# Patient Record
Sex: Female | Born: 1955
Health system: Southern US, Community
[De-identification: ages and names within clinical notes are randomized; demographics above are authoritative.]

## PROBLEM LIST (undated history)

## (undated) ENCOUNTER — Emergency Department (HOSPITAL_COMMUNITY): Admission: EM | Payer: Self-pay | Source: Home / Self Care

## (undated) DIAGNOSIS — M1990 Unspecified osteoarthritis, unspecified site: Secondary | ICD-10-CM

## (undated) DIAGNOSIS — T7840XA Allergy, unspecified, initial encounter: Secondary | ICD-10-CM

## (undated) DIAGNOSIS — F419 Anxiety disorder, unspecified: Secondary | ICD-10-CM

## (undated) DIAGNOSIS — N393 Stress incontinence (female) (male): Secondary | ICD-10-CM

## (undated) DIAGNOSIS — J449 Chronic obstructive pulmonary disease, unspecified: Secondary | ICD-10-CM

## (undated) DIAGNOSIS — R000 Tachycardia, unspecified: Secondary | ICD-10-CM

## (undated) DIAGNOSIS — J45909 Unspecified asthma, uncomplicated: Secondary | ICD-10-CM

## (undated) DIAGNOSIS — M797 Fibromyalgia: Secondary | ICD-10-CM

## (undated) DIAGNOSIS — G629 Polyneuropathy, unspecified: Secondary | ICD-10-CM

## (undated) DIAGNOSIS — F32A Depression, unspecified: Secondary | ICD-10-CM

## (undated) DIAGNOSIS — D649 Anemia, unspecified: Secondary | ICD-10-CM

## (undated) DIAGNOSIS — F329 Major depressive disorder, single episode, unspecified: Secondary | ICD-10-CM

## (undated) DIAGNOSIS — K259 Gastric ulcer, unspecified as acute or chronic, without hemorrhage or perforation: Secondary | ICD-10-CM

## (undated) DIAGNOSIS — K219 Gastro-esophageal reflux disease without esophagitis: Secondary | ICD-10-CM

## (undated) DIAGNOSIS — I10 Essential (primary) hypertension: Secondary | ICD-10-CM

## (undated) HISTORY — PX: TUBAL LIGATION: SHX77

## (undated) HISTORY — PX: TONSILLECTOMY: SUR1361

## (undated) HISTORY — DX: Essential (primary) hypertension: I10

## (undated) HISTORY — DX: Allergy, unspecified, initial encounter: T78.40XA

## (undated) HISTORY — DX: Unspecified asthma, uncomplicated: J45.909

## (undated) HISTORY — PX: SHOULDER SURGERY: SHX246

## (undated) HISTORY — PX: INCONTINENCE SURGERY: SHX676

## (undated) HISTORY — PX: SPLENECTOMY, PARTIAL: SHX787

---
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
## 2002-10-29 ENCOUNTER — Other Ambulatory Visit: Admission: RE | Admit: 2002-10-29 | Discharge: 2002-10-29 | Payer: Self-pay | Admitting: Family Medicine

## 2002-12-02 ENCOUNTER — Other Ambulatory Visit: Admission: RE | Admit: 2002-12-02 | Discharge: 2002-12-02 | Payer: Self-pay | Admitting: Family Medicine

## 2003-12-09 ENCOUNTER — Other Ambulatory Visit: Admission: RE | Admit: 2003-12-09 | Discharge: 2003-12-09 | Payer: Self-pay | Admitting: *Deleted

## 2004-02-24 ENCOUNTER — Other Ambulatory Visit: Admission: RE | Admit: 2004-02-24 | Discharge: 2004-02-24 | Payer: Self-pay | Admitting: *Deleted

## 2004-02-24 ENCOUNTER — Other Ambulatory Visit: Admission: RE | Admit: 2004-02-24 | Discharge: 2004-02-24 | Payer: Self-pay | Admitting: Family Medicine

## 2009-03-03 ENCOUNTER — Ambulatory Visit: Payer: Self-pay | Admitting: Cardiology

## 2009-07-10 ENCOUNTER — Ambulatory Visit (HOSPITAL_COMMUNITY): Admission: RE | Admit: 2009-07-10 | Discharge: 2009-07-10 | Payer: Self-pay | Admitting: Neurology

## 2010-05-08 ENCOUNTER — Emergency Department (HOSPITAL_COMMUNITY)
Admission: EM | Admit: 2010-05-08 | Discharge: 2010-05-09 | Disposition: A | Discharge status: Home / Self Care | Payer: MEDICARE | Attending: Emergency Medicine | Admitting: Emergency Medicine

## 2010-05-08 DIAGNOSIS — F329 Major depressive disorder, single episode, unspecified: Secondary | ICD-10-CM | POA: Insufficient documentation

## 2010-05-08 DIAGNOSIS — G609 Hereditary and idiopathic neuropathy, unspecified: Secondary | ICD-10-CM | POA: Insufficient documentation

## 2010-05-08 DIAGNOSIS — G8929 Other chronic pain: Secondary | ICD-10-CM | POA: Insufficient documentation

## 2010-05-08 DIAGNOSIS — F3289 Other specified depressive episodes: Secondary | ICD-10-CM | POA: Insufficient documentation

## 2010-05-08 DIAGNOSIS — I10 Essential (primary) hypertension: Secondary | ICD-10-CM | POA: Insufficient documentation

## 2010-05-08 LAB — DIFFERENTIAL
Basophils Absolute: 0.1 10*3/uL (ref 0.0–0.1)
Basophils Relative: 1 % (ref 0–1)
Lymphocytes Relative: 30 % (ref 12–46)
Lymphs Abs: 2.8 10*3/uL (ref 0.7–4.0)
Neutro Abs: 5.3 10*3/uL (ref 1.7–7.7)
Neutrophils Relative %: 57 % (ref 43–77)

## 2010-05-08 LAB — CBC
HCT: 41.8 % (ref 36.0–46.0)
Hemoglobin: 13.9 g/dL (ref 12.0–15.0)
MCH: 31.2 pg (ref 26.0–34.0)
MCHC: 33.3 g/dL (ref 30.0–36.0)
MCV: 93.9 fL (ref 78.0–100.0)
Platelets: 249 K/uL (ref 150–400)
RBC: 4.45 MIL/uL (ref 3.87–5.11)
RDW: 15.2 % (ref 11.5–15.5)
WBC: 9.4 K/uL (ref 4.0–10.5)

## 2010-05-08 LAB — RAPID URINE DRUG SCREEN, HOSP PERFORMED: Barbiturates: NOT DETECTED

## 2010-05-09 LAB — COMPREHENSIVE METABOLIC PANEL
ALT: 24 U/L (ref 0–35)
Alkaline Phosphatase: 85 U/L (ref 39–117)
GFR calc non Af Amer: >60 mL/min (ref >60)
Glucose, Bld: 84 mg/dL (ref 70–99)
Sodium: 134 mEq/L — ABNORMAL LOW (ref 135–145)
Total Bilirubin: 0.4 mg/dL (ref 0.3–1.2)
Total Protein: 7.3 g/dL (ref 6.0–8.3)

## 2011-01-07 ENCOUNTER — Encounter (HOSPITAL_COMMUNITY): Payer: Self-pay | Admitting: Pharmacy Technician

## 2011-01-10 NOTE — Patient Instructions (Addendum)
[address]  01/10/2011   Your procedure is scheduled on:  01/18/2011  Report to [facility] at 07:30 AM.  Call this number if you have problems the morning of surgery: [phone]   Remember:   Do not eat food:After Midnight.  May have clear liquids:until Midnight .  Clear liquids include soda, tea, black coffee, apple or grape juice, broth.  Take these medicines the morning of surgery with A SIP OF WATER: Xanax, Citalopram, Cymbalta, Gabapentin, Metoprolol, Percocet, Tramadol, Desmopressin, and Metocarbamol.   Do not wear jewelry, make-up or nail polish.  Do not wear lotions, powders, or perfumes. You may wear deodorant.  Do not shave 48 hours prior to surgery.  Do not bring valuables to the hospital.  Contacts, dentures or bridgework may not be worn into surgery.  Leave suitcase in the car. After surgery it may be brought to your room.  For patients admitted to the hospital, checkout time is 11:00 AM the day of discharge.   Patients discharged the day of surgery will not be allowed to drive home.  Name and phone number of your driver:   Special Instructions: CHG Shower Use Special Wash: 1/2 bottle night before surgery and 1/2 bottle morning of surgery.   Please read over the following fact sheets that you were given: Pain Booklet, MRSA Information, Surgical Site Infection Prevention, Anesthesia Post-op Instructions and Care and Recovery After Surgery     Tension Free Vaginal Tape System (TVT) Tension free vaginal tape (TVT or TFT) procedure is a surgery procedure to correct involuntary leaking from the bladder(urinary stress incontinence) in women. A 1/3-inch prolene mesh tape is used as a sling underneath the area where the bladder and tube that drains the bladder(urethra) connect. This restores the muscle and connective tissue (fascia) that have been stretched and damaged from having babies, or from chronic stress and straining. The operation takes less than an hour. It is an  outpatient procedure (you go home the same day). It has an 80 to 90% success rate. LET YOUR CAREGIVER KNOW ABOUT:   If you have any allergies, especially to medicines.   All the medicines you are taking, including prescription drugs, over-the-counter drugs, herbal medicines, eye drops, and creams.   Previous problems with anesthesia or Novocaine.   History of blood clots or other bleeding problems.   Previous surgery, especially bladder or vaginal surgery.   Other health problems or medical diseases.  RISKS AND COMPLICATIONS   Bleeding.   Infection.   Injury to surrounding organs and blood vessels.   Problems with urination after the surgery.   Failure of the operation to work as expected.   Allergy to the mesh tape.   Problems or allergies to the anesthetic.  BEFORE THE PROCEDURE   Do not eat or drink anything for 12 hours before the surgery.   Do not take aspirin or blood thinners for one week before the surgery.   Let your caregiver know if you develop a cold or an infection before the operation.   If you are admitted on the day of surgery, arrive at the hospital an hour before the operation, to sign any necessary forms and to get prepared for the surgery.   There is a waiting area available for family and friends during the surgery.   Let your caregiver know if you think you may be pregnant.  PROCEDURE  An intravenous (IV) will be placed in your arm, and a medicine will be given to relax  you, but not put you to sleep. You will be given a local anesthetic in the area above the pubic bone and along the top of the vagina. Two very small incisions will be made just above the pubic bone. The lining (mucosa) at the top of the vagina will be opened and separated on each side of the urethra and bladder. A catheter is then placed in the bladder, for filling and drainage during the procedure. Then the prolene mesh tape is placed under the urethra, and the ends are passed up  behind the pubic bone, just under the skin. After the mesh tape is properly placed, the catheter is removed. The patient is asked to cough, so that adjustments can be made to the tape, to make sure there is no leaking of urine before stitching (suturing) the mesh tape in place. The ends of the mesh tape are brought up, just under the skin, and sutured in place. The skin incisions are sutured closed. The vaginal lining is also stitched closed, and you are taken to the recovery room. This operation should not be done if the woman is pregnant or plans to get pregnant, is on blood thinners (anticoagulant treatment), or has a urinary tract infection. AFTER THE PROCEDURE  You will go to a postoperative recovery area until your vital signs (blood pressure, pulse, breathing, and temperature) are stable. You may need to stay in the hospital a day or two, but most patients are sent home the day of the surgery. You may be sent home with pain pills or an antibiotic, if necessary. HOME CARE INSTRUCTIONS   Follow your caregiver's advice about diet, rest, driving, exercise, medicines, and follow-up appointments.   You may take over-the-counter pain medicine, with your caregiver's recommendation.   Do not take aspirin. It can cause bleeding.   Do not lift over 5 pounds, until your caregiver says it is ok.   Do not have sexual intercourse, until your caregiver says it is ok.   Do not use tampons.   You may take a laxative if needed, with your caregiver's recommendation.   You may take sitz baths 2 to 3 times a day with your caregiver's advice.  SEEK MEDICAL CARE IF:   There is increasing pain in the wound area.   There is increased swelling and redness in the wound area.   You develop a rash.   You develop nausea, diarrhea, constipation or vomiting.   You think the stitches in the wound are breaking up.   You lose urine when you cough or sneeze.  SEEK IMMEDIATE MEDICAL CARE IF:   You have an oral  temperature above 102 F (38.9 C), not controlled by medicine.   You begin to bleed from the wound area, above the pubic bone or vagina.   You see pus coming from the incisions.   You develop an abnormal vaginal discharge.   You cannot urinate.   You develop bloody or painful urination.   You pass out.   You develop chest or leg pain.   You develop abdominal pain.  Document Released: 02/15/2009 Document Revised: 10/06/2010 Document Reviewed: 02/15/2009 [facility] Patient Information 2012 [place], [place].    PATIENT INSTRUCTIONS POST-ANESTHESIA  IMMEDIATELY FOLLOWING SURGERY:  Do not drive or operate machinery for the first twenty four hours after surgery.  Do not make any important decisions for twenty four hours after surgery or while taking narcotic pain medications or sedatives.  If you develop intractable nausea and vomiting or a  severe headache please notify your doctor immediately.  FOLLOW-UP:  Please make an appointment with your surgeon as instructed. You do not need to follow up with anesthesia unless specifically instructed to do so.  WOUND CARE INSTRUCTIONS (if applicable):  Keep a dry clean dressing on the anesthesia/puncture wound site if there is drainage.  Once the wound has quit draining you may leave it open to air.  Generally you should leave the bandage intact for twenty four hours unless there is drainage.  If the epidural site drains for more than 36-48 hours please call the anesthesia department.  QUESTIONS?:  Please feel free to call your physician or the hospital operator if you have any questions, and they will be happy to assist you.     [facility] Anesthesia Department [address] [place] [phone]

## 2011-01-11 ENCOUNTER — Other Ambulatory Visit: Payer: Self-pay

## 2011-01-11 ENCOUNTER — Encounter (HOSPITAL_COMMUNITY)
Admission: RE | Admit: 2011-01-11 | Discharge: 2011-01-11 | Disposition: A | Discharge status: Home / Self Care | Payer: Medicare Other | Source: Ambulatory Visit | Attending: Urology | Admitting: Urology

## 2011-01-11 ENCOUNTER — Encounter (HOSPITAL_COMMUNITY): Payer: Self-pay

## 2011-01-11 DIAGNOSIS — Z01818 Encounter for other preprocedural examination: Secondary | ICD-10-CM | POA: Insufficient documentation

## 2011-01-11 DIAGNOSIS — Z01812 Encounter for preprocedural laboratory examination: Secondary | ICD-10-CM | POA: Insufficient documentation

## 2011-01-11 HISTORY — DX: Anxiety disorder, unspecified: F41.9

## 2011-01-11 HISTORY — DX: Major depressive disorder, single episode, unspecified: F32.9

## 2011-01-11 HISTORY — DX: Depression, unspecified: F32.A

## 2011-01-11 HISTORY — DX: Fibromyalgia: M79.7

## 2011-01-11 HISTORY — DX: Tachycardia, unspecified: R00.0

## 2011-01-11 HISTORY — DX: Chronic obstructive pulmonary disease, unspecified: J44.9

## 2011-01-11 LAB — BASIC METABOLIC PANEL
Calcium: 9.7 mg/dL (ref 8.4–10.5)
Creatinine, Ser: 0.81 mg/dL (ref 0.50–1.10)

## 2011-01-11 LAB — CBC
MCH: 31.9 pg (ref 26.0–34.0)
MCHC: 34.4 g/dL (ref 30.0–36.0)
RBC: 4.32 MIL/uL (ref 3.87–5.11)
WBC: 10.6 10*3/uL — ABNORMAL HIGH (ref 4.0–10.5)

## 2011-01-11 NOTE — Pre-Procedure Instructions (Signed)
Labs called to and faxed to Dr Rudean Haskell office.

## 2011-01-12 NOTE — Pre-Procedure Instructions (Signed)
Pt is on Desmopressin and her pre-op lab work from 01/11/11 showed her sodium level to be 127. Dr. Jayme Cloud made aware, no orders given.

## 2011-01-18 ENCOUNTER — Ambulatory Visit (HOSPITAL_COMMUNITY): Admission: RE | Admit: 2011-01-18 | Payer: Medicare Other | Source: Ambulatory Visit | Admitting: Urology

## 2011-01-18 ENCOUNTER — Encounter (HOSPITAL_COMMUNITY): Admission: RE | Payer: Self-pay | Source: Ambulatory Visit

## 2011-01-18 SURGERY — URETHROPEXY, USING TRANSVAGINAL TAPE
Anesthesia: Choice

## 2012-02-08 HISTORY — PX: COLONOSCOPY: SHX174

## 2015-02-26 DIAGNOSIS — G894 Chronic pain syndrome: Secondary | ICD-10-CM | POA: Diagnosis not present

## 2015-02-26 DIAGNOSIS — Q059 Spina bifida, unspecified: Secondary | ICD-10-CM | POA: Diagnosis not present

## 2015-02-26 DIAGNOSIS — M545 Low back pain: Secondary | ICD-10-CM | POA: Diagnosis not present

## 2015-02-26 DIAGNOSIS — Z79899 Other long term (current) drug therapy: Secondary | ICD-10-CM | POA: Diagnosis not present

## 2015-02-26 DIAGNOSIS — M159 Polyosteoarthritis, unspecified: Secondary | ICD-10-CM | POA: Diagnosis not present

## 2015-02-26 DIAGNOSIS — M797 Fibromyalgia: Secondary | ICD-10-CM | POA: Diagnosis not present

## 2015-02-26 DIAGNOSIS — M25519 Pain in unspecified shoulder: Secondary | ICD-10-CM | POA: Diagnosis not present

## 2015-05-19 DIAGNOSIS — M061 Adult-onset Still's disease: Secondary | ICD-10-CM | POA: Diagnosis not present

## 2015-05-19 DIAGNOSIS — M1711 Unilateral primary osteoarthritis, right knee: Secondary | ICD-10-CM | POA: Diagnosis not present

## 2015-05-26 DIAGNOSIS — J449 Chronic obstructive pulmonary disease, unspecified: Secondary | ICD-10-CM | POA: Diagnosis not present

## 2015-05-26 DIAGNOSIS — I10 Essential (primary) hypertension: Secondary | ICD-10-CM | POA: Diagnosis not present

## 2015-05-26 DIAGNOSIS — Z79899 Other long term (current) drug therapy: Secondary | ICD-10-CM | POA: Diagnosis not present

## 2015-05-26 DIAGNOSIS — M545 Low back pain: Secondary | ICD-10-CM | POA: Diagnosis not present

## 2015-05-26 DIAGNOSIS — F329 Major depressive disorder, single episode, unspecified: Secondary | ICD-10-CM | POA: Diagnosis not present

## 2015-05-26 DIAGNOSIS — R2681 Unsteadiness on feet: Secondary | ICD-10-CM | POA: Diagnosis not present

## 2015-05-26 DIAGNOSIS — Q059 Spina bifida, unspecified: Secondary | ICD-10-CM | POA: Diagnosis not present

## 2015-05-26 DIAGNOSIS — F17200 Nicotine dependence, unspecified, uncomplicated: Secondary | ICD-10-CM | POA: Diagnosis not present

## 2015-05-26 DIAGNOSIS — G894 Chronic pain syndrome: Secondary | ICD-10-CM | POA: Diagnosis not present

## 2015-05-26 DIAGNOSIS — M25519 Pain in unspecified shoulder: Secondary | ICD-10-CM | POA: Diagnosis not present

## 2015-05-26 DIAGNOSIS — N393 Stress incontinence (female) (male): Secondary | ICD-10-CM | POA: Diagnosis not present

## 2015-05-26 DIAGNOSIS — M797 Fibromyalgia: Secondary | ICD-10-CM | POA: Diagnosis not present

## 2015-06-02 DIAGNOSIS — Z1231 Encounter for screening mammogram for malignant neoplasm of breast: Secondary | ICD-10-CM | POA: Diagnosis not present

## 2015-06-22 DIAGNOSIS — Z79899 Other long term (current) drug therapy: Secondary | ICD-10-CM | POA: Diagnosis not present

## 2015-06-22 DIAGNOSIS — Z5181 Encounter for therapeutic drug level monitoring: Secondary | ICD-10-CM | POA: Diagnosis not present

## 2015-06-26 DIAGNOSIS — M545 Low back pain: Secondary | ICD-10-CM | POA: Diagnosis not present

## 2015-06-26 DIAGNOSIS — M19011 Primary osteoarthritis, right shoulder: Secondary | ICD-10-CM | POA: Diagnosis not present

## 2015-07-03 DIAGNOSIS — R600 Localized edema: Secondary | ICD-10-CM | POA: Diagnosis not present

## 2015-07-12 DIAGNOSIS — R05 Cough: Secondary | ICD-10-CM | POA: Diagnosis not present

## 2015-07-12 DIAGNOSIS — J40 Bronchitis, not specified as acute or chronic: Secondary | ICD-10-CM | POA: Diagnosis not present

## 2015-07-22 DIAGNOSIS — J40 Bronchitis, not specified as acute or chronic: Secondary | ICD-10-CM | POA: Diagnosis not present

## 2015-07-22 DIAGNOSIS — I10 Essential (primary) hypertension: Secondary | ICD-10-CM | POA: Diagnosis not present

## 2015-07-23 DIAGNOSIS — Z79899 Other long term (current) drug therapy: Secondary | ICD-10-CM | POA: Diagnosis not present

## 2015-07-23 DIAGNOSIS — M797 Fibromyalgia: Secondary | ICD-10-CM | POA: Diagnosis not present

## 2015-07-23 DIAGNOSIS — F329 Major depressive disorder, single episode, unspecified: Secondary | ICD-10-CM | POA: Diagnosis not present

## 2015-07-23 DIAGNOSIS — F17200 Nicotine dependence, unspecified, uncomplicated: Secondary | ICD-10-CM | POA: Diagnosis not present

## 2015-07-23 DIAGNOSIS — M545 Low back pain: Secondary | ICD-10-CM | POA: Diagnosis not present

## 2015-07-23 DIAGNOSIS — G894 Chronic pain syndrome: Secondary | ICD-10-CM | POA: Diagnosis not present

## 2015-07-23 DIAGNOSIS — J449 Chronic obstructive pulmonary disease, unspecified: Secondary | ICD-10-CM | POA: Diagnosis not present

## 2015-07-23 DIAGNOSIS — N393 Stress incontinence (female) (male): Secondary | ICD-10-CM | POA: Diagnosis not present

## 2015-07-23 DIAGNOSIS — M25519 Pain in unspecified shoulder: Secondary | ICD-10-CM | POA: Diagnosis not present

## 2015-07-23 DIAGNOSIS — I10 Essential (primary) hypertension: Secondary | ICD-10-CM | POA: Diagnosis not present

## 2015-07-23 DIAGNOSIS — R2681 Unsteadiness on feet: Secondary | ICD-10-CM | POA: Diagnosis not present

## 2015-07-23 DIAGNOSIS — Q059 Spina bifida, unspecified: Secondary | ICD-10-CM | POA: Diagnosis not present

## 2015-07-27 DIAGNOSIS — R600 Localized edema: Secondary | ICD-10-CM | POA: Diagnosis not present

## 2015-09-21 DIAGNOSIS — Z79891 Long term (current) use of opiate analgesic: Secondary | ICD-10-CM | POA: Diagnosis not present

## 2015-09-21 DIAGNOSIS — F329 Major depressive disorder, single episode, unspecified: Secondary | ICD-10-CM | POA: Diagnosis not present

## 2015-09-21 DIAGNOSIS — F17200 Nicotine dependence, unspecified, uncomplicated: Secondary | ICD-10-CM | POA: Diagnosis not present

## 2015-09-21 DIAGNOSIS — G894 Chronic pain syndrome: Secondary | ICD-10-CM | POA: Diagnosis not present

## 2015-09-21 DIAGNOSIS — Q059 Spina bifida, unspecified: Secondary | ICD-10-CM | POA: Diagnosis not present

## 2015-09-21 DIAGNOSIS — M797 Fibromyalgia: Secondary | ICD-10-CM | POA: Diagnosis not present

## 2015-09-21 DIAGNOSIS — M25519 Pain in unspecified shoulder: Secondary | ICD-10-CM | POA: Diagnosis not present

## 2015-09-21 DIAGNOSIS — I10 Essential (primary) hypertension: Secondary | ICD-10-CM | POA: Diagnosis not present

## 2015-09-21 DIAGNOSIS — R2681 Unsteadiness on feet: Secondary | ICD-10-CM | POA: Diagnosis not present

## 2015-09-21 DIAGNOSIS — J449 Chronic obstructive pulmonary disease, unspecified: Secondary | ICD-10-CM | POA: Diagnosis not present

## 2015-09-21 DIAGNOSIS — N393 Stress incontinence (female) (male): Secondary | ICD-10-CM | POA: Diagnosis not present

## 2015-09-21 DIAGNOSIS — M545 Low back pain: Secondary | ICD-10-CM | POA: Diagnosis not present

## 2015-10-02 DIAGNOSIS — F17200 Nicotine dependence, unspecified, uncomplicated: Secondary | ICD-10-CM | POA: Diagnosis not present

## 2015-10-02 DIAGNOSIS — F329 Major depressive disorder, single episode, unspecified: Secondary | ICD-10-CM | POA: Diagnosis not present

## 2015-10-02 DIAGNOSIS — M25519 Pain in unspecified shoulder: Secondary | ICD-10-CM | POA: Diagnosis not present

## 2015-10-02 DIAGNOSIS — M545 Low back pain: Secondary | ICD-10-CM | POA: Diagnosis not present

## 2015-10-02 DIAGNOSIS — J449 Chronic obstructive pulmonary disease, unspecified: Secondary | ICD-10-CM | POA: Diagnosis not present

## 2015-10-02 DIAGNOSIS — N393 Stress incontinence (female) (male): Secondary | ICD-10-CM | POA: Diagnosis not present

## 2015-10-02 DIAGNOSIS — M797 Fibromyalgia: Secondary | ICD-10-CM | POA: Diagnosis not present

## 2015-10-02 DIAGNOSIS — G894 Chronic pain syndrome: Secondary | ICD-10-CM | POA: Diagnosis not present

## 2015-10-02 DIAGNOSIS — I10 Essential (primary) hypertension: Secondary | ICD-10-CM | POA: Diagnosis not present

## 2015-10-02 DIAGNOSIS — Z79899 Other long term (current) drug therapy: Secondary | ICD-10-CM | POA: Diagnosis not present

## 2015-10-02 DIAGNOSIS — R2681 Unsteadiness on feet: Secondary | ICD-10-CM | POA: Diagnosis not present

## 2015-10-02 DIAGNOSIS — Q059 Spina bifida, unspecified: Secondary | ICD-10-CM | POA: Diagnosis not present

## 2015-11-19 DIAGNOSIS — R2681 Unsteadiness on feet: Secondary | ICD-10-CM | POA: Diagnosis not present

## 2015-11-19 DIAGNOSIS — N393 Stress incontinence (female) (male): Secondary | ICD-10-CM | POA: Diagnosis not present

## 2015-11-19 DIAGNOSIS — I10 Essential (primary) hypertension: Secondary | ICD-10-CM | POA: Diagnosis not present

## 2015-11-19 DIAGNOSIS — M797 Fibromyalgia: Secondary | ICD-10-CM | POA: Diagnosis not present

## 2015-11-19 DIAGNOSIS — F17200 Nicotine dependence, unspecified, uncomplicated: Secondary | ICD-10-CM | POA: Diagnosis not present

## 2015-11-19 DIAGNOSIS — Z79899 Other long term (current) drug therapy: Secondary | ICD-10-CM | POA: Diagnosis not present

## 2015-11-19 DIAGNOSIS — M545 Low back pain: Secondary | ICD-10-CM | POA: Diagnosis not present

## 2015-11-19 DIAGNOSIS — J449 Chronic obstructive pulmonary disease, unspecified: Secondary | ICD-10-CM | POA: Diagnosis not present

## 2015-11-19 DIAGNOSIS — Z79891 Long term (current) use of opiate analgesic: Secondary | ICD-10-CM | POA: Diagnosis not present

## 2015-11-19 DIAGNOSIS — F329 Major depressive disorder, single episode, unspecified: Secondary | ICD-10-CM | POA: Diagnosis not present

## 2015-11-19 DIAGNOSIS — Q059 Spina bifida, unspecified: Secondary | ICD-10-CM | POA: Diagnosis not present

## 2015-11-19 DIAGNOSIS — G894 Chronic pain syndrome: Secondary | ICD-10-CM | POA: Diagnosis not present

## 2015-11-19 DIAGNOSIS — M25519 Pain in unspecified shoulder: Secondary | ICD-10-CM | POA: Diagnosis not present

## 2015-11-25 ENCOUNTER — Other Ambulatory Visit (HOSPITAL_COMMUNITY): Payer: Self-pay | Admitting: Neurology

## 2015-11-25 ENCOUNTER — Ambulatory Visit (HOSPITAL_COMMUNITY)
Admission: RE | Admit: 2015-11-25 | Discharge: 2015-11-25 | Disposition: A | Discharge status: Home / Self Care | Payer: PPO | Source: Ambulatory Visit | Attending: Neurology | Admitting: Neurology

## 2015-11-25 DIAGNOSIS — X58XXXA Exposure to other specified factors, initial encounter: Secondary | ICD-10-CM | POA: Diagnosis not present

## 2015-11-25 DIAGNOSIS — S42033A Displaced fracture of lateral end of unspecified clavicle, initial encounter for closed fracture: Secondary | ICD-10-CM | POA: Diagnosis not present

## 2015-11-25 DIAGNOSIS — S2241XA Multiple fractures of ribs, right side, initial encounter for closed fracture: Secondary | ICD-10-CM | POA: Diagnosis not present

## 2015-11-25 DIAGNOSIS — M25511 Pain in right shoulder: Secondary | ICD-10-CM | POA: Insufficient documentation

## 2015-11-25 DIAGNOSIS — G8929 Other chronic pain: Secondary | ICD-10-CM | POA: Insufficient documentation

## 2015-11-25 DIAGNOSIS — R52 Pain, unspecified: Secondary | ICD-10-CM

## 2015-12-02 ENCOUNTER — Ambulatory Visit (INDEPENDENT_AMBULATORY_CARE_PROVIDER_SITE_OTHER): Payer: PPO | Admitting: Orthopaedic Surgery

## 2015-12-02 ENCOUNTER — Encounter: Payer: Self-pay | Admitting: Orthopaedic Surgery

## 2015-12-02 VITALS — BP 141/76 | HR 76 | Temp 97.3°F | Ht 60.0 in | Wt 145.0 lb

## 2015-12-02 DIAGNOSIS — F17210 Nicotine dependence, cigarettes, uncomplicated: Secondary | ICD-10-CM | POA: Diagnosis not present

## 2015-12-02 DIAGNOSIS — M25511 Pain in right shoulder: Secondary | ICD-10-CM

## 2015-12-02 NOTE — Patient Instructions (Signed)
Bupropion sustained-release tablets (smoking cessation) What is this medicine? BUPROPION (byoo PROE pee on) is used to help people quit smoking. This medicine may be used for other purposes; ask your health care provider or pharmacist if you have questions. What should I tell my health care provider before I take this medicine? They need to know if you have any of these conditions: -an eating disorder, such as anorexia or bulimia -bipolar disorder or psychosis -diabetes or high blood sugar, treated with medication -glaucoma -head injury or brain tumor -heart disease, previous heart attack, or irregular heart beat -high blood pressure -kidney or liver disease -seizures -suicidal thoughts or a previous suicide attempt -Tourette's syndrome -weight loss -an unusual or allergic reaction to bupropion, other medicines, foods, dyes, or preservatives -breast-feeding -pregnant or trying to become pregnant How should I use this medicine? Take this medicine by mouth with a glass of water. Follow the directions on the prescription label. You can take it with or without food. If it upsets your stomach, take it with food. Do not cut, crush or chew this medicine. Take your medicine at regular intervals. If you take this medicine more than once a day, take your second dose at least 8 hours after you take your first dose. To limit difficulty in sleeping, avoid taking this medicine at bedtime. Do not take your medicine more often than directed. Do not stop taking this medicine suddenly except upon the advice of your doctor. Stopping this medicine too quickly may cause serious side effects. A special MedGuide will be given to you by the pharmacist with each prescription and refill. Be sure to read this information carefully each time. Talk to your pediatrician regarding the use of this medicine in children. Special care may be needed. Overdosage: If you think you have taken too much of this medicine contact a  poison control center or emergency room at once. NOTE: This medicine is only for you. Do not share this medicine with others. What if I miss a dose? If you miss a dose, skip the missed dose and take your next tablet at the regular time. There should be at least 8 hours between doses. Do not take double or extra doses. What may interact with this medicine? Do not take this medicine with any of the following medications: -linezolid -MAOIs like Azilect, Carbex, Eldepryl, Marplan, Nardil, and Parnate -methylene blue (injected into a vein) -other medicines that contain bupropion like Wellbutrin This medicine may also interact with the following medications: -alcohol -certain medicines for anxiety or sleep -certain medicines for blood pressure like metoprolol, propranolol -certain medicines for depression or psychotic disturbances -certain medicines for HIV or AIDS like efavirenz, lopinavir, nelfinavir, ritonavir -certain medicines for irregular heart beat like propafenone, flecainide -certain medicines for Parkinson's disease like amantadine, levodopa -certain medicines for seizures like carbamazepine, phenytoin, phenobarbital -cimetidine -clopidogrel -cyclophosphamide -furazolidone -isoniazid -nicotine -orphenadrine -procarbazine -steroid medicines like prednisone or cortisone -stimulant medicines for attention disorders, weight loss, or to stay awake -tamoxifen -theophylline -thiotepa -ticlopidine -tramadol -warfarin This list may not describe all possible interactions. Give your health care provider a list of all the medicines, herbs, non-prescription drugs, or dietary supplements you use. Also tell them if you smoke, drink alcohol, or use illegal drugs. Some items may interact with your medicine. What should I watch for while using this medicine? Visit your doctor or health care professional for regular checks on your progress. This medicine should be used together with a patient  support program. It is important   to participate in a behavioral program, counseling, or other support program that is recommended by your health care professional. Patients and their families should watch out for new or worsening thoughts of suicide or depression. Also watch out for sudden changes in feelings such as feeling anxious, agitated, panicky, irritable, hostile, aggressive, impulsive, severely restless, overly excited and hyperactive, or not being able to sleep. If this happens, especially at the beginning of treatment or after a change in dose, call your health care professional. Avoid alcoholic drinks while taking this medicine. Drinking excessive alcoholic beverages, using sleeping or anxiety medicines, or quickly stopping the use of these agents while taking this medicine may increase your risk for a seizure. Do not drive or use heavy machinery until you know how this medicine affects you. This medicine can impair your ability to perform these tasks. Do not take this medicine close to bedtime. It may prevent you from sleeping. Your mouth may get dry. Chewing sugarless gum or sucking hard candy, and drinking plenty of water may help. Contact your doctor if the problem does not go away or is severe. Do not use nicotine patches or chewing gum without the advice of your doctor or health care professional while taking this medicine. You may need to have your blood pressure taken regularly if your doctor recommends that you use both nicotine and this medicine together. What side effects may I notice from receiving this medicine? Side effects that you should report to your doctor or health care professional as soon as possible: -allergic reactions like skin rash, itching or hives, swelling of the face, lips, or tongue -breathing problems -changes in vision -confusion -fast or irregular heartbeat -hallucinations -increased blood pressure -redness, blistering, peeling or loosening of the skin,  including inside the mouth -seizures -suicidal thoughts or other mood changes -unusually weak or tired -vomiting Side effects that usually do not require medical attention (report to your doctor or health care professional if they continue or are bothersome): -change in sex drive or performance -constipation -headache -loss of appetite -nausea -tremors -weight loss This list may not describe all possible side effects. Call your doctor for medical advice about side effects. You may report side effects to FDA at 1-800-FDA-1088. Where should I keep my medicine? Keep out of the reach of children. Store at room temperature between 20 and 25 degrees C (68 and 77 degrees F). Protect from light. Keep container tightly closed. Throw away any unused medicine after the expiration date. NOTE: This sheet is a summary. It may not cover all possible information. If you have questions about this medicine, talk to your doctor, pharmacist, or health care provider.    2016, [employer]vier/[employer] Standard. (2012-09-21 10:55:10)  
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
## 2015-12-08 ENCOUNTER — Ambulatory Visit: Payer: PPO | Admitting: Physical Therapy

## 2015-12-10 DIAGNOSIS — R2681 Unsteadiness on feet: Secondary | ICD-10-CM | POA: Diagnosis not present

## 2015-12-10 DIAGNOSIS — I10 Essential (primary) hypertension: Secondary | ICD-10-CM | POA: Diagnosis not present

## 2015-12-10 DIAGNOSIS — M545 Low back pain: Secondary | ICD-10-CM | POA: Diagnosis not present

## 2015-12-10 DIAGNOSIS — M5412 Radiculopathy, cervical region: Secondary | ICD-10-CM | POA: Diagnosis not present

## 2015-12-10 DIAGNOSIS — G894 Chronic pain syndrome: Secondary | ICD-10-CM | POA: Diagnosis not present

## 2015-12-10 DIAGNOSIS — Q059 Spina bifida, unspecified: Secondary | ICD-10-CM | POA: Diagnosis not present

## 2015-12-10 DIAGNOSIS — Z79891 Long term (current) use of opiate analgesic: Secondary | ICD-10-CM | POA: Diagnosis not present

## 2015-12-10 DIAGNOSIS — N393 Stress incontinence (female) (male): Secondary | ICD-10-CM | POA: Diagnosis not present

## 2015-12-10 DIAGNOSIS — G603 Idiopathic progressive neuropathy: Secondary | ICD-10-CM | POA: Diagnosis not present

## 2015-12-10 DIAGNOSIS — M797 Fibromyalgia: Secondary | ICD-10-CM | POA: Diagnosis not present

## 2015-12-10 DIAGNOSIS — M25519 Pain in unspecified shoulder: Secondary | ICD-10-CM | POA: Diagnosis not present

## 2015-12-14 ENCOUNTER — Ambulatory Visit: Payer: PPO | Admitting: Physical Therapy

## 2015-12-16 ENCOUNTER — Ambulatory Visit: Payer: PPO | Admitting: Orthopaedic Surgery

## 2016-01-05 ENCOUNTER — Ambulatory Visit: Payer: PPO | Admitting: Orthopaedic Surgery

## 2016-01-18 DIAGNOSIS — M25519 Pain in unspecified shoulder: Secondary | ICD-10-CM | POA: Diagnosis not present

## 2016-01-18 DIAGNOSIS — Q059 Spina bifida, unspecified: Secondary | ICD-10-CM | POA: Diagnosis not present

## 2016-01-18 DIAGNOSIS — M545 Low back pain: Secondary | ICD-10-CM | POA: Diagnosis not present

## 2016-01-18 DIAGNOSIS — R2681 Unsteadiness on feet: Secondary | ICD-10-CM | POA: Diagnosis not present

## 2016-01-18 DIAGNOSIS — I10 Essential (primary) hypertension: Secondary | ICD-10-CM | POA: Diagnosis not present

## 2016-01-18 DIAGNOSIS — N393 Stress incontinence (female) (male): Secondary | ICD-10-CM | POA: Diagnosis not present

## 2016-01-18 DIAGNOSIS — Z79899 Other long term (current) drug therapy: Secondary | ICD-10-CM | POA: Diagnosis not present

## 2016-01-18 DIAGNOSIS — G894 Chronic pain syndrome: Secondary | ICD-10-CM | POA: Diagnosis not present

## 2016-01-18 DIAGNOSIS — M797 Fibromyalgia: Secondary | ICD-10-CM | POA: Diagnosis not present

## 2016-01-18 DIAGNOSIS — F17200 Nicotine dependence, unspecified, uncomplicated: Secondary | ICD-10-CM | POA: Diagnosis not present

## 2016-01-18 DIAGNOSIS — F329 Major depressive disorder, single episode, unspecified: Secondary | ICD-10-CM | POA: Diagnosis not present

## 2016-01-18 DIAGNOSIS — J449 Chronic obstructive pulmonary disease, unspecified: Secondary | ICD-10-CM | POA: Diagnosis not present

## 2016-02-17 DIAGNOSIS — M545 Low back pain: Secondary | ICD-10-CM | POA: Diagnosis not present

## 2016-02-17 DIAGNOSIS — M25519 Pain in unspecified shoulder: Secondary | ICD-10-CM | POA: Diagnosis not present

## 2016-02-17 DIAGNOSIS — I10 Essential (primary) hypertension: Secondary | ICD-10-CM | POA: Diagnosis not present

## 2016-02-17 DIAGNOSIS — F329 Major depressive disorder, single episode, unspecified: Secondary | ICD-10-CM | POA: Diagnosis not present

## 2016-02-17 DIAGNOSIS — N393 Stress incontinence (female) (male): Secondary | ICD-10-CM | POA: Diagnosis not present

## 2016-02-17 DIAGNOSIS — F17200 Nicotine dependence, unspecified, uncomplicated: Secondary | ICD-10-CM | POA: Diagnosis not present

## 2016-02-17 DIAGNOSIS — Q059 Spina bifida, unspecified: Secondary | ICD-10-CM | POA: Diagnosis not present

## 2016-02-17 DIAGNOSIS — R2681 Unsteadiness on feet: Secondary | ICD-10-CM | POA: Diagnosis not present

## 2016-02-17 DIAGNOSIS — Z79899 Other long term (current) drug therapy: Secondary | ICD-10-CM | POA: Diagnosis not present

## 2016-02-17 DIAGNOSIS — G894 Chronic pain syndrome: Secondary | ICD-10-CM | POA: Diagnosis not present

## 2016-02-17 DIAGNOSIS — J449 Chronic obstructive pulmonary disease, unspecified: Secondary | ICD-10-CM | POA: Diagnosis not present

## 2016-02-17 DIAGNOSIS — M797 Fibromyalgia: Secondary | ICD-10-CM | POA: Diagnosis not present

## 2016-02-29 ENCOUNTER — Inpatient Hospital Stay (HOSPITAL_COMMUNITY)
Admission: EM | Admit: 2016-02-29 | Discharge: 2016-03-02 | DRG: 378 | Disposition: A | Discharge status: Home / Self Care | Payer: PPO | Attending: Internal Medicine | Admitting: Internal Medicine

## 2016-02-29 ENCOUNTER — Emergency Department (HOSPITAL_COMMUNITY): Payer: PPO

## 2016-02-29 ENCOUNTER — Encounter (HOSPITAL_COMMUNITY): Payer: Self-pay | Admitting: Emergency Medicine

## 2016-02-29 DIAGNOSIS — K922 Gastrointestinal hemorrhage, unspecified: Secondary | ICD-10-CM

## 2016-02-29 DIAGNOSIS — D62 Acute posthemorrhagic anemia: Secondary | ICD-10-CM | POA: Diagnosis present

## 2016-02-29 DIAGNOSIS — A09 Infectious gastroenteritis and colitis, unspecified: Secondary | ICD-10-CM | POA: Diagnosis not present

## 2016-02-29 DIAGNOSIS — K2901 Acute gastritis with bleeding: Secondary | ICD-10-CM | POA: Diagnosis not present

## 2016-02-29 DIAGNOSIS — I10 Essential (primary) hypertension: Secondary | ICD-10-CM | POA: Diagnosis present

## 2016-02-29 DIAGNOSIS — T50905A Adverse effect of unspecified drugs, medicaments and biological substances, initial encounter: Secondary | ICD-10-CM | POA: Diagnosis present

## 2016-02-29 DIAGNOSIS — F329 Major depressive disorder, single episode, unspecified: Secondary | ICD-10-CM | POA: Diagnosis present

## 2016-02-29 DIAGNOSIS — N393 Stress incontinence (female) (male): Secondary | ICD-10-CM | POA: Diagnosis present

## 2016-02-29 DIAGNOSIS — D649 Anemia, unspecified: Secondary | ICD-10-CM | POA: Diagnosis not present

## 2016-02-29 DIAGNOSIS — R000 Tachycardia, unspecified: Secondary | ICD-10-CM | POA: Diagnosis present

## 2016-02-29 DIAGNOSIS — R112 Nausea with vomiting, unspecified: Secondary | ICD-10-CM

## 2016-02-29 DIAGNOSIS — Z7982 Long term (current) use of aspirin: Secondary | ICD-10-CM | POA: Diagnosis not present

## 2016-02-29 DIAGNOSIS — Z9851 Tubal ligation status: Secondary | ICD-10-CM

## 2016-02-29 DIAGNOSIS — K208 Other esophagitis: Secondary | ICD-10-CM | POA: Diagnosis present

## 2016-02-29 DIAGNOSIS — F419 Anxiety disorder, unspecified: Secondary | ICD-10-CM | POA: Diagnosis present

## 2016-02-29 DIAGNOSIS — K250 Acute gastric ulcer with hemorrhage: Principal | ICD-10-CM | POA: Diagnosis present

## 2016-02-29 DIAGNOSIS — Z8711 Personal history of peptic ulcer disease: Secondary | ICD-10-CM | POA: Diagnosis not present

## 2016-02-29 DIAGNOSIS — Z79899 Other long term (current) drug therapy: Secondary | ICD-10-CM | POA: Diagnosis not present

## 2016-02-29 DIAGNOSIS — J441 Chronic obstructive pulmonary disease with (acute) exacerbation: Secondary | ICD-10-CM | POA: Diagnosis not present

## 2016-02-29 DIAGNOSIS — Z9081 Acquired absence of spleen: Secondary | ICD-10-CM

## 2016-02-29 DIAGNOSIS — M797 Fibromyalgia: Secondary | ICD-10-CM | POA: Diagnosis present

## 2016-02-29 DIAGNOSIS — K259 Gastric ulcer, unspecified as acute or chronic, without hemorrhage or perforation: Secondary | ICD-10-CM | POA: Diagnosis not present

## 2016-02-29 DIAGNOSIS — R0902 Hypoxemia: Secondary | ICD-10-CM | POA: Diagnosis not present

## 2016-02-29 DIAGNOSIS — F17210 Nicotine dependence, cigarettes, uncomplicated: Secondary | ICD-10-CM | POA: Diagnosis present

## 2016-02-29 DIAGNOSIS — D72829 Elevated white blood cell count, unspecified: Secondary | ICD-10-CM | POA: Diagnosis present

## 2016-02-29 DIAGNOSIS — R197 Diarrhea, unspecified: Secondary | ICD-10-CM | POA: Diagnosis not present

## 2016-02-29 DIAGNOSIS — R05 Cough: Secondary | ICD-10-CM | POA: Diagnosis not present

## 2016-02-29 DIAGNOSIS — K921 Melena: Secondary | ICD-10-CM | POA: Diagnosis not present

## 2016-02-29 HISTORY — DX: Stress incontinence (female) (male): N39.3

## 2016-02-29 LAB — MRSA PCR SCREENING: MRSA BY PCR: NEGATIVE

## 2016-02-29 LAB — CBC WITH DIFFERENTIAL/PLATELET
Basophils Absolute: 0 10*3/uL (ref 0.0–0.1)
Basophils Relative: 0 %
EOS ABS: 0 10*3/uL (ref 0.0–0.7)
EOS PCT: 0 %
HCT: 26.6 % — ABNORMAL LOW (ref 36.0–46.0)
HEMOGLOBIN: 9.1 g/dL — AB (ref 12.0–15.0)
LYMPHS ABS: 1.4 10*3/uL (ref 0.7–4.0)
LYMPHS PCT: 8 %
MCH: 31 pg (ref 26.0–34.0)
MCHC: 34.2 g/dL (ref 30.0–36.0)
MCV: 90.5 fL (ref 78.0–100.0)
Monocytes Absolute: 0.9 10*3/uL (ref 0.1–1.0)
Monocytes Relative: 5 %
NEUTROS PCT: 87 %
Neutro Abs: 15.3 10*3/uL — ABNORMAL HIGH (ref 1.7–7.7)
Platelets: 292 10*3/uL (ref 150–400)
RBC: 2.94 MIL/uL — AB (ref 3.87–5.11)
RDW: 12.6 % (ref 11.5–15.5)
WBC: 17.7 10*3/uL — AB (ref 4.0–10.5)

## 2016-02-29 LAB — COMPREHENSIVE METABOLIC PANEL
ALT: 12 U/L — ABNORMAL LOW (ref 14–54)
AST: 18 U/L (ref 15–41)
Albumin: 3 g/dL — ABNORMAL LOW (ref 3.5–5.0)
Alkaline Phosphatase: 59 U/L (ref 38–126)
Anion gap: 9 (ref 5–15)
BILIRUBIN TOTAL: 0.4 mg/dL (ref 0.3–1.2)
BUN: 46 mg/dL — ABNORMAL HIGH (ref 6–20)
CALCIUM: 8.7 mg/dL — AB (ref 8.9–10.3)
CHLORIDE: 96 mmol/L — AB (ref 101–111)
CO2: 27 mmol/L (ref 22–32)
CREATININE: 0.9 mg/dL (ref 0.44–1.00)
GLUCOSE: 142 mg/dL — AB (ref 65–99)
POTASSIUM: 3.4 mmol/L — AB (ref 3.5–5.1)
Sodium: 132 mmol/L — ABNORMAL LOW (ref 135–145)
Total Protein: 6.1 g/dL — ABNORMAL LOW (ref 6.5–8.1)

## 2016-02-29 LAB — URINALYSIS, ROUTINE W REFLEX MICROSCOPIC
Bilirubin Urine: NEGATIVE
Glucose, UA: NEGATIVE mg/dL
Ketones, ur: NEGATIVE mg/dL
Leukocytes, UA: NEGATIVE
Nitrite: NEGATIVE
PH: 7 (ref 5.0–8.0)
Protein, ur: NEGATIVE mg/dL
SPECIFIC GRAVITY, URINE: 1.012 (ref 1.005–1.030)

## 2016-02-29 LAB — C DIFFICILE QUICK SCREEN W PCR REFLEX
C DIFFICILE (CDIFF) INTERP: NOT DETECTED
C DIFFICILE (CDIFF) TOXIN: NEGATIVE
C Diff antigen: NEGATIVE

## 2016-02-29 LAB — POC OCCULT BLOOD, ED: FECAL OCCULT BLD: POSITIVE — AB

## 2016-02-29 LAB — HEMOGLOBIN
Hemoglobin: 8.4 g/dL — ABNORMAL LOW (ref 12.0–15.0)
Hemoglobin: 8.2 g/dL — ABNORMAL LOW (ref 12.0–15.0)

## 2016-02-29 LAB — INFLUENZA PANEL BY PCR (TYPE A & B)
INFLBPCR: NEGATIVE
Influenza A By PCR: NEGATIVE

## 2016-02-29 LAB — HEMATOCRIT
HCT: 23.7 % — ABNORMAL LOW (ref 36.0–46.0)
HEMATOCRIT: 24.2 % — AB (ref 36.0–46.0)

## 2016-02-29 LAB — TSH: TSH: 0.865 u[IU]/mL (ref 0.350–4.500)

## 2016-02-29 LAB — LIPASE, BLOOD: Lipase: 21 U/L (ref 11–51)

## 2016-02-29 MED ORDER — SODIUM CHLORIDE 0.9 % IV SOLN
80.0000 mg | Freq: Once | INTRAVENOUS | Status: AC
  Administered 2016-02-29: 14:00:00 80 mg via INTRAVENOUS
  Filled 2016-02-29: qty 80

## 2016-02-29 MED ORDER — ACETAMINOPHEN 325 MG PO TABS
650.0000 mg | ORAL_TABLET | Freq: Four times a day (QID) | ORAL | Status: DC | PRN
  Administered 2016-02-29: 650 mg via ORAL
  Filled 2016-02-29 (×2): qty 2

## 2016-02-29 MED ORDER — ONDANSETRON HCL 4 MG PO TABS: 4.0000 mg | ORAL_TABLET | Freq: Four times a day (QID) | ORAL | Status: DC | PRN

## 2016-02-29 MED ORDER — GABAPENTIN 300 MG PO CAPS
600.0000 mg | ORAL_CAPSULE | Freq: Four times a day (QID) | ORAL | Status: DC
  Administered 2016-02-29 – 2016-03-02 (×6): 600 mg via ORAL
  Filled 2016-02-29 (×7): qty 2

## 2016-02-29 MED ORDER — ALBUTEROL SULFATE (2.5 MG/3ML) 0.083% IN NEBU: 2.5000 mg | INHALATION_SOLUTION | Freq: Four times a day (QID) | RESPIRATORY_TRACT | Status: DC

## 2016-02-29 MED ORDER — ACETAMINOPHEN 650 MG RE SUPP: 650.0000 mg | Freq: Four times a day (QID) | RECTAL | Status: DC | PRN

## 2016-02-29 MED ORDER — DULOXETINE HCL 30 MG PO CPEP
30.0000 mg | ORAL_CAPSULE | ORAL | Status: DC
  Administered 2016-03-01 – 2016-03-02 (×2): 30 mg via ORAL
  Filled 2016-02-29 (×2): qty 1

## 2016-02-29 MED ORDER — ALPRAZOLAM 0.5 MG PO TABS
0.5000 mg | ORAL_TABLET | Freq: Three times a day (TID) | ORAL | Status: DC | PRN
  Administered 2016-02-29 – 2016-03-02 (×5): 0.5 mg via ORAL
  Filled 2016-02-29 (×6): qty 1

## 2016-02-29 MED ORDER — PANTOPRAZOLE SODIUM 40 MG IV SOLR: 40.0000 mg | Freq: Two times a day (BID) | INTRAVENOUS | Status: DC

## 2016-02-29 MED ORDER — ORAL CARE MOUTH RINSE
15.0000 mL | Freq: Two times a day (BID) | OROMUCOSAL | Status: DC
  Administered 2016-02-29 – 2016-03-02 (×3): 15 mL via OROMUCOSAL

## 2016-02-29 MED ORDER — ONDANSETRON HCL 4 MG/2ML IJ SOLN: 4.0000 mg | Freq: Four times a day (QID) | INTRAMUSCULAR | Status: DC | PRN

## 2016-02-29 MED ORDER — CITALOPRAM HYDROBROMIDE 20 MG PO TABS
40.0000 mg | ORAL_TABLET | Freq: Every day | ORAL | Status: DC
  Administered 2016-02-29 – 2016-03-02 (×3): 40 mg via ORAL
  Filled 2016-02-29 (×3): qty 2

## 2016-02-29 MED ORDER — SODIUM CHLORIDE 0.9 % IV SOLN
8.0000 mg/h | INTRAVENOUS | Status: DC
  Administered 2016-02-29: 8 mg/h via INTRAVENOUS
  Filled 2016-02-29 (×5): qty 80

## 2016-02-29 MED ORDER — ALBUTEROL SULFATE (2.5 MG/3ML) 0.083% IN NEBU
2.5000 mg | INHALATION_SOLUTION | Freq: Three times a day (TID) | RESPIRATORY_TRACT | Status: DC
  Administered 2016-02-29 – 2016-03-02 (×5): 2.5 mg via RESPIRATORY_TRACT
  Filled 2016-02-29 (×6): qty 3

## 2016-02-29 MED ORDER — KCL IN DEXTROSE-NACL 20-5-0.9 MEQ/L-%-% IV SOLN
INTRAVENOUS | Status: DC
  Administered 2016-02-29 – 2016-03-02 (×3): via INTRAVENOUS

## 2016-02-29 MED ORDER — ALBUTEROL SULFATE (2.5 MG/3ML) 0.083% IN NEBU
2.5000 mg | INHALATION_SOLUTION | RESPIRATORY_TRACT | Status: DC | PRN
  Administered 2016-03-01: 2.5 mg via RESPIRATORY_TRACT

## 2016-02-29 MED ORDER — ONDANSETRON HCL 4 MG/2ML IJ SOLN
4.0000 mg | Freq: Once | INTRAMUSCULAR | Status: AC
  Administered 2016-02-29: 4 mg via INTRAVENOUS
  Filled 2016-02-29: qty 2

## 2016-02-29 MED ORDER — SODIUM CHLORIDE 0.9 % IV BOLUS (SEPSIS)
1000.0000 mL | Freq: Once | INTRAVENOUS | Status: AC
  Administered 2016-02-29: 1000 mL via INTRAVENOUS

## 2016-02-29 MED ORDER — SODIUM CHLORIDE 0.9% FLUSH
3.0000 mL | Freq: Two times a day (BID) | INTRAVENOUS | Status: DC
  Administered 2016-02-29 – 2016-03-02 (×5): 3 mL via INTRAVENOUS

## 2016-02-29 NOTE — ED Triage Notes (Signed)
Per [facility]: Pt reports n/v/d, fever since Saturday night.  Pt alert and oriented at this time.

## 2016-02-29 NOTE — Progress Notes (Signed)
Patient has had 3 loose black stools since admission to ICU. Hgb on admission 9.1 and is now 8.4, MD notified.

## 2016-02-29 NOTE — ED Provider Notes (Signed)
[facility] DEPT Provider Note   CSN: [id number] Arrival date & time: 02/29/16  1023     History   Chief Complaint Chief Complaint  Patient presents with  . Emesis    HPI Brooke Deleon is a 61 y.o. female.  Brooke Deleon is a 61 y.o. Female who presents to the emergency department complaining of cough, nausea, vomiting, diarrhea and subjective fevers for the past 3 days. She tells me her stool is black in color and diarrhea. Patient also reports some intermittent wheezing. She tells me multiple episodes of vomiting and diarrhea. She denies any abdominal pain. She complains of hunger pains. No treatments attempted prior to arrival. She does tell me she spoke to her primary care doctor and was prescribed doxycycline and cough syrup for her cough. Patient has had tubal ligation and partial splenectomy. Patient denies hematemesis, urinary symptoms, rashes, shortness of breath, chest pain, syncope.    The history is provided by the patient. No language interpreter was used.  Emesis   Associated symptoms include cough, diarrhea and a fever. Pertinent negatives include no abdominal pain and no headaches.    Past Medical History:  Diagnosis Date  . Anxiety   . COPD (chronic obstructive pulmonary disease) ([place])   . Depression   . Fibromyalgia   . Stress incontinence   . Tachycardia     Patient Active Problem List   Diagnosis Date Noted  . Diarrhea 02/29/2016  . Upper GI bleeding 02/29/2016  . COPD with acute exacerbation ([place]) 02/29/2016  . Pill esophagitis 02/29/2016  . Upper GI bleed 02/29/2016    Past Surgical History:  Procedure Laterality Date  . [facility]  . [facility], [facility]     [facility]spleen [facility] due to M[facility]  . TONSILLECTOMY     child  . TUBAL LIGATION      OB History    No data available       Home Medications    Prior to Admission medications   Medication Sig Start Date  End Date Taking? Authorizing Provider  ALPRAZolam Duanne Moron) 1 MG tablet Take 1 mg by mouth 4 (four) times daily.     Yes Historical Provider, MD  aspirin 81 MG chewable tablet Chew 81 mg by mouth every morning.     Yes Historical Provider, MD  citalopram (CELEXA) 40 MG tablet Take 40 mg by mouth daily.     Yes Historical Provider, MD  desmopressin (DDAVP) 0.2 MG tablet Take 0.2 mg by mouth at bedtime. Patient had a prescription written on 01/04/11 from Dr. Michela Pitcher  For this med to be taken for 2 weeks, but patient was already taking what she had at home.    Yes Historical Provider, MD  DULoxetine (CYMBALTA) 30 MG capsule Take 30 mg by mouth every morning.     Yes Historical Provider, MD  gabapentin (NEURONTIN) 600 MG tablet Take 600 mg by mouth 4 (four) times daily.     Yes Historical Provider, MD  methocarbamol (ROBAXIN) 750 MG tablet Take 750 mg by mouth 3 (three) times daily.     Yes Historical Provider, MD  metoprolol (LOPRESSOR) 100 MG tablet Take 100 mg by mouth every morning.     Yes Historical Provider, MD  oxyCODONE-acetaminophen (PERCOCET) 10-325 MG per tablet Take 1 tablet by mouth 3 (three) times daily.     Yes Historical Provider, MD    Family History Family History  Problem Relation Age  of Onset  . Anesthesia problems Neg Hx   . Hypotension Neg Hx   . Malignant hyperthermia Neg Hx   . Pseudochol deficiency Neg Hx   . Colon cancer Neg Hx     Social History Social History  Substance Use Topics  . Smoking status: Current Every Day Smoker    Packs/day: 1.00    Years: 40.00    Types: Cigarettes  . Smokeless tobacco: Not on file  . Alcohol use No     Allergies   Codeine   Review of Systems Review of Systems  Constitutional: Positive for fatigue and fever.  HENT: Negative for congestion and sore throat.   Eyes: Negative for visual disturbance.  Respiratory: Positive for cough and wheezing. Negative for shortness of breath.   Cardiovascular: Negative for chest pain and  palpitations.  Gastrointestinal: Positive for diarrhea, nausea and vomiting. Negative for abdominal pain and blood in stool.  Genitourinary: Negative for difficulty urinating, dysuria, frequency and urgency.  Musculoskeletal: Negative for back pain and neck pain.  Skin: Negative for rash.  Neurological: Negative for syncope and headaches.     Physical Exam Updated Vital Signs BP 155/75   Pulse 111   Temp 98.7 F (37.1 C) (Oral)   Resp 17   Ht 5' (1.524 m)   Wt 65.8 kg   SpO2 99%   BMI 28.32 kg/m   Physical Exam  Constitutional: She appears well-developed and well-nourished. No distress.  HENT:  Head: Normocephalic and atraumatic.  Mouth/Throat: Oropharynx is clear and moist.  Eyes: Conjunctivae are normal. Pupils are equal, round, and reactive to light. Right eye exhibits no discharge. Left eye exhibits no discharge.  Neck: Neck supple.  Cardiovascular: Normal rate, regular rhythm, normal heart sounds and intact distal pulses.  Exam reveals no gallop and no friction rub.   No murmur heard. Pulmonary/Chest: Effort normal. No respiratory distress. She has wheezes. She has no rales.  Slight scattered wheezes noted bilaterally. No rales or rhonchi. No increased work of breathing.  Abdominal: Soft. Bowel sounds are normal. She exhibits no distension and no mass. There is tenderness. There is no guarding.  Abdomen is soft. Bowel sounds are present. Patient has mild generalized bone tenderness to palpation without focal tenderness. No CVA or flank tenderness. No peritoneal signs.  Musculoskeletal: She exhibits no edema or tenderness.  Lymphadenopathy:    She has no cervical adenopathy.  Neurological: She is alert. Coordination normal.  Skin: Skin is warm and dry. Capillary refill takes less than 2 seconds. No rash noted. She is not diaphoretic. No erythema. No pallor.  Psychiatric: She has a normal mood and affect. Her behavior is normal.  Nursing note and vitals reviewed.    ED  Treatments / Results  Labs (all labs ordered are listed, but only abnormal results are displayed) Labs Reviewed  COMPREHENSIVE METABOLIC PANEL - Abnormal; Notable for the following:       Result Value   Sodium 132 (*)    Potassium 3.4 (*)    Chloride 96 (*)    Glucose, Bld 142 (*)    BUN 46 (*)    Calcium 8.7 (*)    Total Protein 6.1 (*)    Albumin 3.0 (*)    ALT 12 (*)    All other components within normal limits  CBC WITH DIFFERENTIAL/PLATELET - Abnormal; Notable for the following:    WBC 17.7 (*)    RBC 2.94 (*)    Hemoglobin 9.1 (*)    HCT 26.6 (*)  Neutro Abs 15.3 (*)    All other components within normal limits  URINALYSIS, ROUTINE W REFLEX MICROSCOPIC - Abnormal; Notable for the following:    Color, Urine STRAW (*)    Hgb urine dipstick MODERATE (*)    Bacteria, UA RARE (*)    All other components within normal limits  HEMOGLOBIN - Abnormal; Notable for the following:    Hemoglobin 8.4 (*)    All other components within normal limits  HEMATOCRIT - Abnormal; Notable for the following:    HCT 24.2 (*)    All other components within normal limits  POC OCCULT BLOOD, ED - Abnormal; Notable for the following:    Fecal Occult Bld POSITIVE (*)    All other components within normal limits  C DIFFICILE QUICK SCREEN W PCR REFLEX  GASTROINTESTINAL PANEL BY PCR, STOOL (REPLACES STOOL CULTURE)  MRSA PCR SCREENING  LIPASE, BLOOD  TSH  HEMOGLOBIN  HEMOGLOBIN  HEMATOCRIT  HEMATOCRIT  INFLUENZA PANEL BY PCR (TYPE A & B)  TYPE AND SCREEN    EKG  EKG Interpretation None       Radiology Dg Chest 2 View  Result Date: 02/29/2016 CLINICAL DATA:  Cough EXAM: CHEST  2 VIEW COMPARISON:  Jul 08, 2010 FINDINGS: There is no edema or consolidation. Heart size and pulmonary vascularity are normal. No adenopathy. There is atherosclerotic calcification in the aorta. There is degenerative change in the thoracic spine. IMPRESSION: No edema or consolidation.  There is aortic  atherosclerosis. Electronically Signed   By: Lowella Grip III M.D.   On: 02/29/2016 11:46    Procedures Procedures (including critical care time)  Medications Ordered in ED Medications  ALPRAZolam (XANAX) tablet 0.5 mg (not administered)  citalopram (CELEXA) tablet 40 mg (not administered)  DULoxetine (CYMBALTA) DR capsule 30 mg (not administered)  gabapentin (NEURONTIN) capsule 600 mg (not administered)  sodium chloride flush (NS) 0.9 % injection 3 mL (not administered)  dextrose 5 % and 0.9 % NaCl with KCl 20 mEq/L infusion ( Intravenous New Bag/Given 02/29/16 1415)  ondansetron (ZOFRAN) tablet 4 mg (not administered)    Or  ondansetron (ZOFRAN) injection 4 mg (not administered)  acetaminophen (TYLENOL) tablet 650 mg (not administered)    Or  acetaminophen (TYLENOL) suppository 650 mg (not administered)  albuterol (PROVENTIL) (2.5 MG/3ML) 0.083% nebulizer solution 2.5 mg (not administered)  pantoprazole (PROTONIX) 80 mg in sodium chloride 0.9 % 250 mL (0.32 mg/mL) infusion (not administered)  pantoprazole (PROTONIX) injection 40 mg (not administered)  albuterol (PROVENTIL) (2.5 MG/3ML) 0.083% nebulizer solution 2.5 mg (not administered)  sodium chloride 0.9 % bolus 1,000 mL (0 mLs Intravenous Stopped 02/29/16 1327)  ondansetron (ZOFRAN) injection 4 mg (4 mg Intravenous Given 02/29/16 1057)  pantoprazole (PROTONIX) 80 mg in sodium chloride 0.9 % 100 mL IVPB (80 mg Intravenous Given 02/29/16 1418)     Initial Impression / Assessment and Plan / ED Course  I have reviewed the triage vital signs and the nursing notes.  Pertinent labs & imaging results that were available during my care of the patient were reviewed by me and considered in my medical decision making (see chart for details).   This  is a 61 y.o. Female who presents to the emergency department complaining of cough, nausea, vomiting, diarrhea and subjective fevers for the past 3 days. She tells me her stool is black in  color and diarrhea. Patient also reports some intermittent wheezing. She tells me multiple episodes of vomiting and diarrhea. She denies any abdominal pain.  She complains of hunger pains. Patient denies being on anticoagulants. She does take a daily baby aspirin. She denies NSAID use.  On exam the patient is afebrile nontoxic appearing. Her abdomen is soft and she is mild generalized abdominal tenderness to palpation. No focal tenderness. Patient is spitting up some in the room and no evidence of blood in her sputum or vomit.  Hemoccult is positive. Black appearing watery stool. CBC is marked for white count of 17,700 and hemoglobin of 9.1. This is a decrease from her baseline around 13.8. CMP is normal for a BUN of 46. Creatinine is 0.90. Will admit to medicine for a GI bleed and anemia. Patient agrees with this plan.  I consulted with hospitalist Dr. Truman Hayward who accepted the patient for admission   This patient was discussed with Dr. Stark Jock who agrees with assessment and plan.   Final Clinical Impressions(s) / ED Diagnoses   Final diagnoses:  Nausea vomiting and diarrhea  Gastrointestinal hemorrhage, unspecified gastrointestinal hemorrhage type  Anemia, unspecified type    New Prescriptions Current Discharge Medication List       Waynetta Pean, PA-C 02/29/16 [place], MD 03/03/16 0700

## 2016-02-29 NOTE — Plan of Care (Signed)
Problem: Safety: Goal: Ability to remain free from injury will improve Outcome: Progressing Pt able to demonstrate using call bell for assistance. Side rails up, bed alarm on and personal items within reach.

## 2016-02-29 NOTE — ED Notes (Signed)
Admitting MD at bedside.

## 2016-02-29 NOTE — Consult Note (Signed)
Referring Provider: [name] Primary Care Physician:  Celedonio Savage, MD Primary Gastroenterologist:  Dr. Gala Romney   Date of Admission: 02/29/16 Date of Consultation: 02/29/16  Reason for Consultation:  GI bleed   HPI:  Brooke Deleon is a 61 y.o. year old female who presented to the ED with several day history of cough, diarrhea, and subjective fevers. Afebrile on admission. Hgb 9.1 on admission, unknown baseline but last Hgb on file from 2012 was in the 13 range. Heme positive.   States a week ago she started feeling sick. Her PCP called her in some doxycycline. Last antibiotic two days ago and unable to take yesterday's final dose as she was not feeling well. States she just laid down and went to sleep yesterday because "I was so tired". No oral intake since Saturday night. Had decreased appetite. Was having persistent coughing Saturday morning. Started having diarrhea Saturday. If she coughs too much, she will have diarrhea. Stool is blackish/red. Upper abdominal discomfort but described as a "hunger pang". No vomiting. Feels nauseated. When she stands up, she breaks out in a cold sweat. Afebrile on admission. Felt at times she was running a fever. Denies any Ibuprofen, Advil, Motrin. No BC or Goody powders. Only takes 81 mg aspirin daily and has had none since Friday. Coughing up clear phlegm.   No history of liver disease. Last colonoscopy per patient at [facility] about 5 years ago. No prior EGD. States she was told she had a duodenal ulcer in the remote past in her 2s and sounds like she had a barium swallow at that time. Baseline of constipation.    Past Medical History:  Diagnosis Date  . Anxiety   . COPD (chronic obstructive pulmonary disease) ([place])   . Depression   . Fibromyalgia   . Stress incontinence   . Tachycardia     Past Surgical History:  Procedure Laterality Date  . [facility]  [facility]  . [facility], [facility]     [facility]  . TONSILLECTOMY     child  . TUBAL LIGATION      Prior to Admission medications   Medication Sig Start Date End Date Taking? Authorizing Provider  ALPRAZolam Duanne Moron) 1 MG tablet Take 1 mg by mouth 4 (four) times daily.     Yes Historical Provider, MD  aspirin 81 MG chewable tablet Chew 81 mg by mouth every morning.     Yes Historical Provider, MD  citalopram (CELEXA) 40 MG tablet Take 40 mg by mouth daily.     Yes Historical Provider, MD  desmopressin (DDAVP) 0.2 MG tablet Take 0.2 mg by mouth at bedtime. Patient had a prescription written on 01/04/11 from Dr. Michela Pitcher  For this med to be taken for 2 weeks, but patient was already taking what she had at home.    Yes Historical Provider, MD  DULoxetine (CYMBALTA) 30 MG capsule Take 30 mg by mouth every morning.     Yes Historical Provider, MD  gabapentin (NEURONTIN) 600 MG tablet Take 600 mg by mouth 4 (four) times daily.     Yes Historical Provider, MD  methocarbamol (ROBAXIN) 750 MG tablet Take 750 mg by mouth 3 (three) times daily.     Yes Historical Provider, MD  metoprolol (LOPRESSOR) 100 MG tablet Take 100 mg by mouth every morning.     Yes Historical Provider, MD  oxyCODONE-acetaminophen (PERCOCET) 10-325 MG per tablet Take 1  tablet by mouth 3 (three) times daily.     Yes Historical Provider, MD    No current facility-administered medications for this encounter.    Current Outpatient Prescriptions  Medication Sig Dispense Refill  . ALPRAZolam (XANAX) 1 MG tablet Take 1 mg by mouth 4 (four) times daily.      Marland Kitchen aspirin 81 MG chewable tablet Chew 81 mg by mouth every morning.      . citalopram (CELEXA) 40 MG tablet Take 40 mg by mouth daily.      Marland Kitchen desmopressin (DDAVP) 0.2 MG tablet Take 0.2 mg by mouth at bedtime. Patient had a prescription written on 01/04/11 from Dr. Michela Pitcher  For this med to be taken for 2 weeks, but patient was already taking what she had at home.     . DULoxetine  (CYMBALTA) 30 MG capsule Take 30 mg by mouth every morning.      . gabapentin (NEURONTIN) 600 MG tablet Take 600 mg by mouth 4 (four) times daily.      . methocarbamol (ROBAXIN) 750 MG tablet Take 750 mg by mouth 3 (three) times daily.      . metoprolol (LOPRESSOR) 100 MG tablet Take 100 mg by mouth every morning.      Marland Kitchen oxyCODONE-acetaminophen (PERCOCET) 10-325 MG per tablet Take 1 tablet by mouth 3 (three) times daily.        Allergies as of 02/29/2016 - Review Complete 02/29/2016  Allergen Reaction Noted  . Codeine Rash 01/11/2011    Family History  Problem Relation Age of Onset  . Anesthesia problems Neg Hx   . Hypotension Neg Hx   . Malignant hyperthermia Neg Hx   . Pseudochol deficiency Neg Hx   . Colon cancer Neg Hx     Social History   Social History  . Marital status: Divorced    Spouse name: N/A  . Number of children: N/A  . Years of education: N/A   Occupational History  . Not on file.   Social History Main Topics  . Smoking status: Current Every Day Smoker    Packs/day: 1.00    Years: 40.00    Types: Cigarettes  . Smokeless tobacco: Not on file  . Alcohol use No  . Drug use: No  . Sexual activity: Not on file   Other Topics Concern  . Not on file   Social History Narrative  . No narrative on file    Review of Systems: Gen: see HPI  CV: Denies chest pain, heart palpitations, syncope, edema  Resp: +cough, wheezing GI: see HPI  GU : Denies urinary burning, urinary frequency, urinary incontinence.  MS: Denies joint pain,swelling, cramping Derm: Denies rash, itching, dry skin Psych: Denies depression, anxiety,confusion, or memory loss Heme: see HPI   Physical Exam: Vital signs in last 24 hours: Temp:  [98.7 F (37.1 C)] 98.7 F (37.1 C) (01/22 1036) Pulse Rate:  [90-93] 93 (01/22 1215) Resp:  [18] 18 (01/22 1036) BP: (131-155)/(82-87) 139/87 (01/22 1200) SpO2:  [94 %-96 %] 95 % (01/22 1215) Weight:  [145 lb (65.8 kg)] 145 lb (65.8 kg) (01/22  1025)   General:   Alert, drowsy but easily awakens. Poor historian.  Head:  Normocephalic and atraumatic. Eyes:  Sclera clear, no icterus.   Conjunctiva pink. Ears:  Normal auditory acuity. Nose:  No deformity, discharge,  or lesions. Mouth:  No deformity or lesions, poor dentition.  Lungs:  Wheezing bilaterally, diminished bases  Heart:  S1 S2 present without murmurs  Abdomen:  Soft, nontender and nondistended. No masses, hepatosplenomegaly or hernias noted. Normal bowel sounds, without guarding, and without rebound.   Rectal:  Deferred  Msk:  Symmetrical without gross deformities. Normal posture. Extremities:  Without edema. Neurologic:  Alert and  oriented x4 Psych:  Alert and cooperative. Normal mood and affect.  Intake/Output from previous day: No intake/output data recorded. Intake/Output this shift: No intake/output data recorded.  Lab Results:  Recent Labs  02/29/16 1051  WBC 17.7*  HGB 9.1*  HCT 26.6*  PLT 292   BMET  Recent Labs  02/29/16 1051  NA 132*  K 3.4*  CL 96*  CO2 27  GLUCOSE 142*  BUN 46*  CREATININE 0.90  CALCIUM 8.7*   LFT  Recent Labs  02/29/16 1051  PROT 6.1*  ALBUMIN 3.0*  AST 18  ALT 12*  ALKPHOS 59  BILITOT 0.4    Studies/Results: Dg Chest 2 View  Result Date: 02/29/2016 CLINICAL DATA:  Cough EXAM: CHEST  2 VIEW COMPARISON:  Jul 08, 2010 FINDINGS: There is no edema or consolidation. Heart size and pulmonary vascularity are normal. No adenopathy. There is atherosclerotic calcification in the aorta. There is degenerative change in the thoracic spine. IMPRESSION: No edema or consolidation.  There is aortic atherosclerosis. Electronically Signed   By: Lowella Grip III M.D.   On: 02/29/2016 11:46    Impression: 61 year old female presenting with persistent cough, nausea, loose stool that is black/red in color per patient, vague upper abdominal discomfort, and heme positive in ED. Hgb 9.1 on admission, unknown baseline. BUN  disproportionately elevated at 46. No known liver disease. Leukocytosis with WBC 17.7. Only takes an 81 mg aspirin daily and denies any other NSAIDs, aspirin powders. Recently finished course of doxycycline, with diarrhea starting the day after course finished. Concern for upper GI bleed with clinical findings of anemia, heme positive stool, elevated BUN. Stool sample with GI pathogen panel collected already, in light of recent antibiotic exposure. Appears to still be dealing with persistent cough, wheezing on clinical exam. Discussed briefly with Dr. Marin Comment at bedside.  Protonix drip started already per attending, agree with admission to step down, follow H/H, and plan on potential EGD with Propofol tomorrow by Dr. Oneida Alar barring any acute changes in status.   Plan: Protonix drip as ordered Serial H/H Supportive measures Respiratory management per attending Follow-up on pending GI pathogen panel and Cdiff quick scan May have clear liquids NPO after midnight for potential EGD with Propofol on 03/01/16 with Dr. Oneida Alar Will continue to follow   Annitta Needs, ANP-BC [facility] Gastroenterology      LOS: 0 days    02/29/2016, 12:38 PM

## 2016-02-29 NOTE — H&P (Signed)
History and Physical    Brooke Deleon [id number] DOB: 06-20-1955 DOA: 02/29/2016  PCP: Celedonio Savage, MD  Patient coming from: Home.    Chief Complaint:  Nausea, vomiting, diarrhea and black stool.   HPI: Brooke Deleon is an 61 y.o. female with hx of prior DU, Anxiety, COPD being an active smoker also, stress incontinence, HTN, fibromyalgia, presented to the ER with 3 days of diarrhea, black stool, nausea, vomiting bilious vomitus.  She was having URI symptoms and was Rx with Doxy which she had finished the course.  Evaluation in the ER included a Hb of 9.1 g per dL, leukocytosis with WBC of 17K, and BUN 46.  She was guaic positive.  Her last Hb was 13.6 but 5 years ago.  Her CXR was clear, and after GI was consulted, hospitalist was asked to admit her for further evaluation.    ED Course:  See above.  Rewiew of Systems:  Constitutional: Negative for malaise, fever and chills. No significant weight loss or weight gain Eyes: Negative for eye pain, redness and discharge, diplopia, visual changes, or flashes of light. ENMT: Negative for ear pain, hoarseness, nasal congestion, sinus pressure and sore throat. No headaches; tinnitus, drooling, or problem swallowing. Cardiovascular: Negative for chest pain, palpitations, diaphoresis, dyspnea and peripheral edema. ; No orthopnea, PND Respiratory: Negative for  hemoptysis, wheezing and stridor. No pleuritic chestpain. Gastrointestinal: Negative for  constipation,  melena, blood in stool, hematemesis, jaundice and rectal bleeding.    Genitourinary: Negative for frequency, dysuria, incontinence,flank pain and hematuria; Musculoskeletal: Negative for back pain and neck pain. Negative for swelling and trauma.;  Skin: . Negative for pruritus, rash, abrasions, bruising and skin lesion.; ulcerations Neuro: Negative for headache, lightheadedness and neck stiffness. Negative for weakness, altered level of consciousness , altered mental status, extremity  weakness, burning feet, involuntary movement, seizure and syncope.  Psych: negative for anxiety, depression, insomnia, tearfulness, panic attacks, hallucinations, paranoia, suicidal or homicidal ideation    Past Medical History:  Diagnosis Date  . Anxiety   . COPD (chronic obstructive pulmonary disease) ([place])   . Depression   . Fibromyalgia   . Stress incontinence   . Tachycardia     Past Surgical History:  Procedure Laterality Date  . [facility]  . [facility], [facility]     [facility]spleen [facility] due to M[facility]  . TONSILLECTOMY     child  . TUBAL LIGATION       reports that she has been smoking Cigarettes.  She has a 40.00 pack-year smoking history. She does not have any smokeless tobacco history on file. She reports that she does not drink alcohol or use drugs.  Allergies  Allergen Reactions  . Codeine Rash    Family History  Problem Relation Age of Onset  . Anesthesia problems Neg Hx   . Hypotension Neg Hx   . Malignant hyperthermia Neg Hx   . Pseudochol deficiency Neg Hx   . Colon cancer Neg Hx      Prior to Admission medications   Medication Sig Start Date End Date Taking? Authorizing Provider  ALPRAZolam Duanne Moron) 1 MG tablet Take 1 mg by mouth 4 (four) times daily.     Yes Historical Provider, MD  aspirin 81 MG chewable tablet Chew 81 mg by mouth every morning.     Yes Historical Provider, MD  citalopram (CELEXA) 40 MG tablet Take 40 mg by mouth daily.  Yes Historical Provider, MD  desmopressin (DDAVP) 0.2 MG tablet Take 0.2 mg by mouth at bedtime. Patient had a prescription written on 01/04/11 from Dr. Michela Pitcher  For this med to be taken for 2 weeks, but patient was already taking what she had at home.    Yes Historical Provider, MD  DULoxetine (CYMBALTA) 30 MG capsule Take 30 mg by mouth every morning.     Yes Historical Provider, MD  gabapentin (NEURONTIN) 600 MG tablet Take 600 mg by mouth 4  (four) times daily.     Yes Historical Provider, MD  methocarbamol (ROBAXIN) 750 MG tablet Take 750 mg by mouth 3 (three) times daily.     Yes Historical Provider, MD  metoprolol (LOPRESSOR) 100 MG tablet Take 100 mg by mouth every morning.     Yes Historical Provider, MD  oxyCODONE-acetaminophen (PERCOCET) 10-325 MG per tablet Take 1 tablet by mouth 3 (three) times daily.     Yes Historical Provider, MD    Physical Exam: Vitals:   02/29/16 1036 02/29/16 1130 02/29/16 1200 02/29/16 1215  BP: 131/82 155/85 139/87   Pulse: 93 90  93  Resp: 18     Temp: 98.7 F (37.1 C)     TempSrc: Oral     SpO2: 95% 96%  95%  Weight:      Height:          Constitutional: NAD, calm, comfortable Vitals:   02/29/16 1036 02/29/16 1130 02/29/16 1200 02/29/16 1215  BP: 131/82 155/85 139/87   Pulse: 93 90  93  Resp: 18     Temp: 98.7 F (37.1 C)     TempSrc: Oral     SpO2: 95% 96%  95%  Weight:      Height:       Eyes: PERRL, lids and conjunctivae normal ENMT: Mucous membranes are moist. Posterior pharynx clear of any exudate or lesions.Normal dentition.  Neck: normal, supple, no masses, no thyromegaly Respiratory: clear to auscultation bilaterally, bilateral wheezing, no crackles. Normal respiratory effort. No accessory muscle use.  Cardiovascular: Regular rate and rhythm, no murmurs / rubs / gallops. No extremity edema. 2+ pedal pulses. No carotid bruits.  Abdomen: no tenderness, no masses palpated. No hepatosplenomegaly. Bowel sounds positive.  Musculoskeletal: no clubbing / cyanosis. No joint deformity upper and lower extremities. Good ROM, no contractures. Normal muscle tone.  Skin: no rashes, lesions, ulcers. No induration Neurologic: CN 2-12 grossly intact. Sensation intact, DTR normal. Strength 5/5 in all 4.  Psychiatric: Normal judgment and insight. Alert and oriented x 3. Normal mood.     Labs on Admission: I have personally reviewed following labs and imaging  studies  CBC:  Recent Labs Lab 02/29/16 1051  WBC 17.7*  NEUTROABS 15.3*  HGB 9.1*  HCT 26.6*  MCV 90.5  PLT [id number]   Basic Metabolic Panel:  Recent Labs Lab 02/29/16 1051  NA 132*  K 3.4*  CL 96*  CO2 27  GLUCOSE 142*  BUN 46*  CREATININE 0.90  CALCIUM 8.7*   GFR: Estimated Creatinine Clearance: 57 mL/min (by C-G formula based on SCr of 0.9 mg/dL). Liver Function Tests:  Recent Labs Lab 02/29/16 1051  AST 18  ALT 12*  ALKPHOS 59  BILITOT 0.4  PROT 6.1*  ALBUMIN 3.0*    Recent Labs Lab 02/29/16 1051  LIPASE 21   Radiological Exams on Admission: Dg Chest 2 View  Result Date: 02/29/2016 CLINICAL DATA:  Cough EXAM: CHEST  2 VIEW COMPARISON:  Jul 08, 2010  FINDINGS: There is no edema or consolidation. Heart size and pulmonary vascularity are normal. No adenopathy. There is atherosclerotic calcification in the aorta. There is degenerative change in the thoracic spine. IMPRESSION: No edema or consolidation.  There is aortic atherosclerosis. Electronically Signed   By: Lowella Grip III M.D.   On: 02/29/2016 11:46    EKG: Independently reviewed.   Assessment/Plan Principal Problem:   Upper GI bleeding Active Problems:   Diarrhea   COPD with acute exacerbation (HCC)   Pill esophagitis   Upper GI bleed    PLAN:   Upper GI Bleed:  Probably a slow bleed, having pill esophagitis or gastritis.  Can't exclude PUD as she has been on ASA.  Will admit to SDU, start IV PPI bolus and drip.  Type and Screen, and transfuse if Hb goes less than 8.  R and B explained, and she gave permission for transfusion if required.  GI saw her, and plan EGD tomorrow.  Made NPO except for meds.   Diarrhea:  After antibiotics, and marked leukocytosis.  Could have C diff.  Will send study, place precaution, and try not to use antibiotic indiscriminatory.  URI:  She had finished doxy.  No further antibiotic is required.  Try not to use steroids.  Start neb Tx.    CODP:  See above.  Advised stop cigarettes.    DVT prophylaxis: SCD.  Code Status: Full Code.  Family Communication: none.  Disposition Plan: home when better.  Consults called: GI.  Admission status: Inpatient.    Kemper Hochman MD FACP. [facility]  If 7PM-7AM, please contact night-coverage [email or web address] Password [facility]  02/29/2016, 12:58 PM

## 2016-03-01 ENCOUNTER — Encounter (HOSPITAL_COMMUNITY): Payer: Self-pay

## 2016-03-01 ENCOUNTER — Encounter (HOSPITAL_COMMUNITY)
Admission: EM | Disposition: A | Discharge status: Home / Self Care | Payer: Self-pay | Source: Home / Self Care | Attending: Internal Medicine

## 2016-03-01 ENCOUNTER — Inpatient Hospital Stay (HOSPITAL_COMMUNITY): Payer: PPO | Admitting: Anesthesiology

## 2016-03-01 DIAGNOSIS — K921 Melena: Secondary | ICD-10-CM

## 2016-03-01 DIAGNOSIS — K259 Gastric ulcer, unspecified as acute or chronic, without hemorrhage or perforation: Secondary | ICD-10-CM

## 2016-03-01 DIAGNOSIS — D649 Anemia, unspecified: Secondary | ICD-10-CM

## 2016-03-01 HISTORY — PX: ESOPHAGOGASTRODUODENOSCOPY (EGD) WITH PROPOFOL: SHX5813

## 2016-03-01 HISTORY — PX: BIOPSY: SHX5522

## 2016-03-01 LAB — BASIC METABOLIC PANEL
Anion gap: 7 (ref 5–15)
BUN: 12 mg/dL (ref 6–20)
CHLORIDE: 108 mmol/L (ref 101–111)
CO2: 23 mmol/L (ref 22–32)
CREATININE: 0.74 mg/dL (ref 0.44–1.00)
Calcium: 8.3 mg/dL — ABNORMAL LOW (ref 8.9–10.3)
GFR calc non Af Amer: >60 mL/min (ref >60)
Glucose, Bld: 160 mg/dL — ABNORMAL HIGH (ref 65–99)
Potassium: 3.5 mmol/L (ref 3.5–5.1)
Sodium: 138 mmol/L (ref 135–145)

## 2016-03-01 LAB — GASTROINTESTINAL PANEL BY PCR, STOOL (REPLACES STOOL CULTURE)
ASTROVIRUS: NOT DETECTED
Adenovirus F40/41: NOT DETECTED
Campylobacter species: NOT DETECTED
Cryptosporidium: NOT DETECTED
Cyclospora cayetanensis: NOT DETECTED
ENTAMOEBA HISTOLYTICA: NOT DETECTED
ENTEROAGGREGATIVE E COLI (EAEC): NOT DETECTED
ENTEROTOXIGENIC E COLI (ETEC): NOT DETECTED
Enteropathogenic E coli (EPEC): NOT DETECTED
GIARDIA LAMBLIA: NOT DETECTED
NOROVIRUS GI/GII: NOT DETECTED
Plesimonas shigelloides: NOT DETECTED
Rotavirus A: NOT DETECTED
SAPOVIRUS (I, II, IV, AND V): NOT DETECTED
SHIGA LIKE TOXIN PRODUCING E COLI (STEC): NOT DETECTED
Salmonella species: NOT DETECTED
Shigella/Enteroinvasive E coli (EIEC): NOT DETECTED
VIBRIO CHOLERAE: NOT DETECTED
Vibrio species: NOT DETECTED
Yersinia enterocolitica: NOT DETECTED

## 2016-03-01 LAB — HEMATOCRIT: HCT: 22.2 % — ABNORMAL LOW (ref 36.0–46.0)

## 2016-03-01 LAB — HEMOGLOBIN AND HEMATOCRIT, BLOOD
HCT: 28.5 % — ABNORMAL LOW (ref 36.0–46.0)
Hemoglobin: 9.6 g/dL — ABNORMAL LOW (ref 12.0–15.0)

## 2016-03-01 LAB — CBC
HCT: 22.4 % — ABNORMAL LOW (ref 36.0–46.0)
Hemoglobin: 7.5 g/dL — ABNORMAL LOW (ref 12.0–15.0)
MCH: 31 pg (ref 26.0–34.0)
MCHC: 33.5 g/dL (ref 30.0–36.0)
MCV: 92.6 fL (ref 78.0–100.0)
Platelets: 258 10*3/uL (ref 150–400)
RBC: 2.42 MIL/uL — ABNORMAL LOW (ref 3.87–5.11)
RDW: 13 % (ref 11.5–15.5)
WBC: 9.9 10*3/uL (ref 4.0–10.5)

## 2016-03-01 LAB — HEMOGLOBIN: Hemoglobin: 7.5 g/dL — ABNORMAL LOW (ref 12.0–15.0)

## 2016-03-01 LAB — PREPARE RBC (CROSSMATCH)

## 2016-03-01 LAB — ABO/RH: ABO/RH(D): O NEG

## 2016-03-01 SURGERY — ESOPHAGOGASTRODUODENOSCOPY (EGD) WITH PROPOFOL
Anesthesia: Monitor Anesthesia Care

## 2016-03-01 MED ORDER — PROPOFOL 10 MG/ML IV BOLUS
INTRAVENOUS | Status: AC
  Filled 2016-03-01: qty 20

## 2016-03-01 MED ORDER — MIDAZOLAM HCL 2 MG/2ML IJ SOLN
0.5000 mg | INTRAMUSCULAR | Status: DC | PRN
  Administered 2016-03-01: 2 mg via INTRAVENOUS

## 2016-03-01 MED ORDER — PROPOFOL 500 MG/50ML IV EMUL
INTRAVENOUS | Status: DC | PRN
  Administered 2016-03-01: 100 ug/kg/min via INTRAVENOUS

## 2016-03-01 MED ORDER — METOPROLOL TARTRATE 50 MG PO TABS
50.0000 mg | ORAL_TABLET | Freq: Two times a day (BID) | ORAL | Status: DC
  Administered 2016-03-01 – 2016-03-02 (×3): 50 mg via ORAL
  Filled 2016-03-01 (×3): qty 1

## 2016-03-01 MED ORDER — LIDOCAINE VISCOUS 2 % MT SOLN
3.0000 mL | Freq: Once | OROMUCOSAL | Status: AC
  Administered 2016-03-01: 3 mL via OROMUCOSAL

## 2016-03-01 MED ORDER — FENTANYL CITRATE (PF) 100 MCG/2ML IJ SOLN
INTRAMUSCULAR | Status: AC
  Filled 2016-03-01: qty 2

## 2016-03-01 MED ORDER — PANTOPRAZOLE SODIUM 40 MG PO TBEC
40.0000 mg | DELAYED_RELEASE_TABLET | Freq: Two times a day (BID) | ORAL | Status: DC
  Administered 2016-03-01 – 2016-03-02 (×2): 40 mg via ORAL
  Filled 2016-03-01 (×2): qty 1

## 2016-03-01 MED ORDER — SODIUM CHLORIDE 0.9 % IV SOLN
INTRAVENOUS | Status: DC | PRN
  Administered 2016-03-01: 11:00:00 via INTRAVENOUS

## 2016-03-01 MED ORDER — MIDAZOLAM HCL 5 MG/5ML IJ SOLN
INTRAMUSCULAR | Status: DC | PRN
  Administered 2016-03-01: 2 mg via INTRAVENOUS

## 2016-03-01 MED ORDER — LIDOCAINE VISCOUS 2 % MT SOLN
OROMUCOSAL | Status: AC
  Filled 2016-03-01: qty 15

## 2016-03-01 MED ORDER — STERILE WATER FOR IRRIGATION IR SOLN
Status: DC | PRN
  Administered 2016-03-01: 100 mL

## 2016-03-01 MED ORDER — FENTANYL CITRATE (PF) 100 MCG/2ML IJ SOLN
25.0000 ug | INTRAMUSCULAR | Status: AC | PRN
  Administered 2016-03-01 (×2): 25 ug via INTRAVENOUS

## 2016-03-01 MED ORDER — LACTATED RINGERS IV SOLN
INTRAVENOUS | Status: DC
  Administered 2016-03-01 (×2): via INTRAVENOUS

## 2016-03-01 MED ORDER — OXYCODONE-ACETAMINOPHEN 5-325 MG PO TABS
2.0000 | ORAL_TABLET | ORAL | Status: AC | PRN
  Administered 2016-03-01 (×2): 2 via ORAL
  Filled 2016-03-01 (×2): qty 2

## 2016-03-01 MED ORDER — MIDAZOLAM HCL 2 MG/2ML IJ SOLN
INTRAMUSCULAR | Status: AC
  Filled 2016-03-01: qty 2

## 2016-03-01 MED ORDER — SODIUM CHLORIDE 0.9 % IV SOLN: INTRAVENOUS | Status: DC

## 2016-03-01 MED ORDER — SODIUM CHLORIDE 0.9 % IV SOLN
Freq: Once | INTRAVENOUS | Status: AC
  Administered 2016-03-01: 10:00:00 via INTRAVENOUS

## 2016-03-01 NOTE — Op Note (Signed)
[facility] Patient Name: Brooke Deleon Procedure Date: 03/01/2016 10:30 AM MRN: [id number] Date of Birth: 12/04/1955 Attending MD: Barney Drain , MD CSN: [id number] Age: 61 Admit Type: Inpatient Procedure:                Upper GI endoscopy WITH COLD FORCEPS BIOPSY Indications:              Melena Providers:                Barney Drain, MD, Rosina Lowenstein, RN, Aram Candela Referring MD:             Celedonio Savage MD, MD Medicines:                Propofol per Anesthesia Complications:            No immediate complications. Estimated Blood Loss:     Estimated blood loss was minimal. Procedure:                Pre-Anesthesia Assessment:                           - Prior to the procedure, a History and Physical                            was performed, and patient medications and                            allergies were reviewed. The patient's tolerance of                            previous anesthesia was also reviewed. The risks                            and benefits of the procedure and the sedation                            options and risks were discussed with the patient.                            All questions were answered, and informed consent                            was obtained. Prior Anticoagulants: The patient has                            taken aspirin, last dose was 2 days prior to                            procedure. ASA Grade Assessment: II - A patient                            with mild systemic disease. After reviewing the                            risks and benefits, the patient was deemed in  satisfactory condition to undergo the procedure.                            After obtaining informed consent, the endoscope was                            passed under direct vision. Throughout the                            procedure, the patient's blood pressure, pulse, and                            oxygen saturations were monitored  continuously. The                            [id number]) was introduced through the                            mouth, and advanced to the second part of duodenum.                            The upper GI endoscopy was accomplished without                            difficulty. The patient tolerated the procedure                            well. Scope In: 11:26:30 AM Scope Out: 11:34:54 AM Total Procedure Duration: 0 hours 8 minutes 24 seconds  Findings:      The examined esophagus was normal.      One non-bleeding cratered gastric ulcer with no stigmata of bleeding was       found on the posterior wall of the gastric body. The lesion was fifteen       mm by twenty mm in largest dimension. Biopsies were taken with a cold       forceps for Helicobacter pylori testing.      The examined duodenum was normal. Impression:               - UGIB DUE TO LARGE gastric ulcer with no stigmata                            of bleeding. Moderate Sedation:      Per Anesthesia Care Recommendation:           - Cardiac diet.                           - Use Protonix (pantoprazole) 40 mg PO BID for 3                            months.                           - Repeat upper endoscopy in 3 months to check  healing.                           - Return to GI office in 2 months.                           - Await pathology results.                           - Return patient to hospital ward.                           - Post procedure medication orders were given. Procedure Code(s):        --- Professional ---                           [phone], Esophagogastroduodenoscopy, flexible,                            transoral; with biopsy, single or multiple Diagnosis Code(s):        --- Professional ---                           K25.9, Gastric ulcer, unspecified as acute or                            chronic, without hemorrhage or perforation                           K92.1, Melena  (includes Hematochezia) CPT copyright 2016 American Medical Association. All rights reserved. The codes documented in this report are preliminary and upon coder review may  be revised to meet current compliance requirements. Barney Drain, MD Barney Drain, MD 03/01/2016 11:46:01 AM This report has been signed electronically. Number of Addenda: 0

## 2016-03-01 NOTE — Interval H&P Note (Signed)
History and Physical Interval Note:  03/01/2016 11:04 AM  Brooke Deleon  has presented today for surgery, with the diagnosis of acute blood loss anemia, melena  The various methods of treatment have been discussed with the patient and family. After consideration of risks, benefits and other options for treatment, the patient has consented to  Procedure(s): ESOPHAGOGASTRODUODENOSCOPY (EGD) WITH PROPOFOL (N/A) as a surgical intervention .  The patient's history has been reviewed, patient examined, no change in status, stable for surgery.  I have reviewed the patient's chart and labs.  Questions were answered to the patient's satisfaction.     [employer]

## 2016-03-01 NOTE — Progress Notes (Signed)
BMP and CBC reviewed. K 3.5, Na 138. BUN 12, Cr 0.74. Hgb remains at 7.5, same as earlier this morning around 0115.   REVIEWED-NO ADDITIONAL RECOMMENDATIONS.

## 2016-03-01 NOTE — H&P (View-Only) (Signed)
Subjective: Cough and wheezing improved with nebulizer treatments. Feels better from a respiratory standpoint. 3 loose black stools on admission to ICU yesterday around 3pm. Patient states she had loose stool overnight but not as dark. Abdominal discomfort improved.   Objective: Vital signs in last 24 hours: Temp:  [97.7 F (36.5 C)-98.7 F (37.1 C)] 98 F (36.7 C) (01/23 0742) Pulse Rate:  [82-111] 88 (01/23 0700) Resp:  [11-20] 11 (01/23 0742) BP: (76-166)/(44-92) 136/82 (01/23 0700) SpO2:  [85 %-100 %] 100 % (01/23 0724) Weight:  [143 lb 11.8 oz (65.2 kg)-145 lb (65.8 kg)] 143 lb 11.8 oz (65.2 kg) (01/23 0500) Last BM Date: 02/29/16 General:   Alert and oriented Head:  Normocephalic and atraumatic. Eyes:  No icterus, sclera clear. Conjuctiva pink.  Mouth:  Without lesions, mucosa pink and moist.  Neck:  Supple, without thyromegaly or masses.  Heart:  S1, S2 present, no murmurs noted.  Lungs: without labored respirations, mild wheeze, improved overall  Abdomen:  Bowel sounds present, soft, non-tender, non-distended. No rebound or guarding Extremities:  Without  edema. Neurologic:  Alert and  oriented x4 Psych:  Alert and cooperative. Normal mood and affect.  Intake/Output from previous day: 01/22 0701 - 01/23 0700 In: 2100 [I.V.:2100] Out: 200 [Urine:200] Intake/Output this shift: No intake/output data recorded.  Lab Results:  Recent Labs  02/29/16 1051 02/29/16 1424 02/29/16 1940 03/01/16 0116  WBC 17.7*  --   --   --   HGB 9.1* 8.4* 8.2* 7.5*  HCT 26.6* 24.2* 23.7* 22.2*  PLT 292  --   --   --    BMET  Recent Labs  02/29/16 1051  NA 132*  K 3.4*  CL 96*  CO2 27  GLUCOSE 142*  BUN 46*  CREATININE 0.90  CALCIUM 8.7*   LFT  Recent Labs  02/29/16 1051  PROT 6.1*  ALBUMIN 3.0*  AST 18  ALT 12*  ALKPHOS 59  BILITOT 0.4    Studies/Results: Dg Chest 2 View  Result Date: 02/29/2016 CLINICAL DATA:  Cough EXAM: CHEST  2 VIEW COMPARISON:  Jul 08, 2010 FINDINGS: There is no edema or consolidation. Heart size and pulmonary vascularity are normal. No adenopathy. There is atherosclerotic calcification in the aorta. There is degenerative change in the thoracic spine. IMPRESSION: No edema or consolidation.  There is aortic atherosclerosis. Electronically Signed   By: Lowella Grip III M.D.   On: 02/29/2016 11:46    Assessment: 61 year old female presenting with likely UGI bleed yesterday and endorsing alka seltzer cold medication daily for last several weeks, Hgb 7.5 this morning and 9.1 on admission yesterday. Melena seems to be tapering. 1 unit PRBCs ordered per attending. From a respiratory standpoint, she is improved clinically and feels better after nebulizer treatments. Appropriate for EGD with Propofol today as per plan yesterday. Discussed with patient the risks and benefits again, and she stated understanding.   As of note, Cdiff negative. GI pathogen panel pending. Ordered due to loose stool at time of admission and prior exposure to antibiotics recently. Influenza panel negative.   Plan: Remain NPO Continue Protonix drip for now EGD with Propofol with Dr. Oneida Alar today Agree with 1 unit PRBCs this morning. Discussed with ICU nursing. Also contacted OR and spoke with Pamala Hurry to update regarding need for 1 unit and clear with anesthesia. Dr. Patsey Berthold aware and would like unit at least hanging by time of procedure if not finished already. Also informed them we were waiting on final  BMP to complete. Timing of case to be determined per OR schedule.  BMP and CBC pending: will follow-up to ensure no electrolyte abnormalities   Annitta Needs, ANP-BC [facility]    LOS: 1 day    03/01/2016, 7:54 AM

## 2016-03-01 NOTE — Transfer of Care (Signed)
Immediate Anesthesia Transfer of Care Note  Patient: Brooke Deleon  Procedure(s) Performed: Procedure(s) with comments: ESOPHAGOGASTRODUODENOSCOPY (EGD) WITH PROPOFOL (N/A) BIOPSY - gastric bx's  Patient Location: PACU  Anesthesia Type:MAC  Level of Consciousness: awake and patient cooperative  Airway & Oxygen Therapy: Patient Spontanous Breathing and Patient connected to face mask oxygen  Post-op Assessment: Report given to RN, Post -op Vital signs reviewed and stable and Patient moving all extremities  Post vital signs: Reviewed and stable  Last Vitals:  Vitals:   03/01/16 1100 03/01/16 1105  BP: (!) 167/91 (!) 160/89  Pulse:    Resp: 12 (!) 0  Temp:      Last Pain:  Vitals:   03/01/16 1040  TempSrc: Oral  PainSc:       Patients Stated Pain Goal: 8 (74/82/70 7867)  Complications: No apparent anesthesia complications

## 2016-03-01 NOTE — Anesthesia Postprocedure Evaluation (Signed)
Anesthesia Post Note  Patient: AMESHIA PEWITT  Procedure(s) Performed: Procedure(s) (LRB): ESOPHAGOGASTRODUODENOSCOPY (EGD) WITH PROPOFOL (N/A) BIOPSY  Patient location during evaluation: PACU Anesthesia Type: MAC Level of consciousness: awake, oriented and patient cooperative Pain management: pain level controlled Vital Signs Assessment: post-procedure vital signs reviewed and stable Respiratory status: spontaneous breathing, nonlabored ventilation and respiratory function stable Cardiovascular status: blood pressure returned to baseline Postop Assessment: no signs of nausea or vomiting Anesthetic complications: no     Last Vitals:  Vitals:   03/01/16 1100 03/01/16 1105  BP: (!) 167/91 (!) 160/89  Pulse:    Resp: 12 (!) 0  Temp:      Last Pain:  Vitals:   03/01/16 1040  TempSrc: Oral  PainSc:                  Jolina Symonds J

## 2016-03-01 NOTE — Progress Notes (Signed)
PROGRESS NOTE    Brooke Deleon  [id number] DOB: April 22, 1955 DOA: 02/29/2016 PCP: Celedonio Savage, MD    Brief Narrative:  Brooke Deleon is an 61 y.o. female with hx of prior DU, Anxiety, COPD being an active smoker also, stress incontinence, HTN, fibromyalgia, presented to the ER with 3 days of diarrhea, black stool, nausea, vomiting bilious vomitus.  She was having URI symptoms and was Rx with Doxy which she had finished the course.  Evaluation in the ER included a Hb of 9.1 g per dL, dropped to 7.5 g per dL,  leukocytosis with WBC of 17K, and BUN 46.  She was guaic positive.  Her last Hb was 13.6 but 5 years ago.  Her CXR was clear, and after GI was consulted, started on PPI IV, and planned for EGD. Her cough and SOB is better.   Assessment & Plan:   Principal Problem:   Upper GI bleeding Active Problems:   Diarrhea   COPD with acute exacerbation (HCC)   Pill esophagitis   Upper GI bleed  Upper GI Bleed:  Probably a slow bleed, having pill esophagitis or gastritis.  Can't exclude PUD as she has been on ASA.  Will admit to SDU, start IV PPI bolus and drip.  Type and Screen, and transfuse if Hb goes less than 8.  R and B explained, and she gave permission for transfusion if required.  GI saw her, and plan EGD tomorrow.  Made NPO except for meds.   Diarrhea:  After antibiotics, and marked leukocytosis.  Could have C diff.  Will send study, place precaution, and try not to use antibiotic indiscriminatory.  URI:  She had finished doxy.  No further antibiotic is required.  Try not to use steroids.  Start neb Tx.    CODP:  See above. Advised stop cigarettes.    DVT prophylaxis: SCD.  Code Status: Full Code.  Family Communication: none.  Disposition Plan: home when better.  Consults called: GI.  Admission status: Inpatient.    Procedures:   None.   Antimicrobials: Anti-infectives    None       Subjective:  Feeling better.  Breathing better.    Objective: Vitals:   03/01/16 0600 03/01/16 0700 03/01/16 0724 03/01/16 0742  BP: (!) 85/54 136/82    Pulse:  88    Resp: 16 11  11   Temp:    98 F (36.7 C)  TempSrc:    Oral  SpO2:  100% 100%   Weight:      Height:        Intake/Output Summary (Last 24 hours) at 03/01/16 0804 Last data filed at 03/01/16 0600  Gross per 24 hour  Intake             2100 ml  Output              200 ml  Net             1900 ml   Filed Weights   02/29/16 1025 03/01/16 0500  Weight: 65.8 kg (145 lb) 65.2 kg (143 lb 11.8 oz)    Examination:  General exam: Appears calm and comfortable  Respiratory system: Still with wheezing. Respiratory effort normal. Cardiovascular system: S1 & S2 heard, RRR. No JVD, murmurs, rubs, gallops or clicks. No pedal edema. Gastrointestinal system: Abdomen is nondistended, soft and nontender. No organomegaly or masses felt. Normal bowel sounds heard. Central nervous system: Alert and oriented. No focal neurological deficits. Extremities: Symmetric 5  x 5 power. Skin: No rashes, lesions or ulcers Psychiatry: Judgement and insight appear normal. Mood & affect appropriate.   Data Reviewed: I have personally reviewed following labs and imaging studies  CBC:  Recent Labs Lab 02/29/16 1051 02/29/16 1424 02/29/16 1940 03/01/16 0116  WBC 17.7*  --   --   --   NEUTROABS 15.3*  --   --   --   HGB 9.1* 8.4* 8.2* 7.5*  HCT 26.6* 24.2* 23.7* 22.2*  MCV 90.5  --   --   --   PLT 292  --   --   --    Basic Metabolic Panel:  Recent Labs Lab 02/29/16 1051  NA 132*  K 3.4*  CL 96*  CO2 27  GLUCOSE 142*  BUN 46*  CREATININE 0.90  CALCIUM 8.7*   GFR: Estimated Creatinine Clearance: 56.7 mL/min (by C-G formula based on SCr of 0.9 mg/dL). Liver Function Tests:  Recent Labs Lab 02/29/16 1051  AST 18  ALT 12*  ALKPHOS 59  BILITOT 0.4  PROT 6.1*  ALBUMIN 3.0*    Recent Labs Lab 02/29/16 1051  LIPASE 21   Thyroid Function Tests:  Recent Labs  02/29/16 1051  TSH 0.865    Anemia Panel:  Recent Results (from the past 240 hour(s))  C difficile quick scan w PCR reflex     Status: None   Collection Time: 02/29/16 11:10 AM  Result Value Ref Range Status   C Diff antigen NEGATIVE NEGATIVE Final   C Diff toxin NEGATIVE NEGATIVE Final   C Diff interpretation No C. difficile detected.  Final  MRSA PCR Screening     Status: None   Collection Time: 02/29/16  2:06 PM  Result Value Ref Range Status   MRSA by PCR NEGATIVE NEGATIVE Final    Comment:        The GeneXpert MRSA Assay (FDA approved for NASAL specimens only), is one component of a comprehensive MRSA colonization surveillance program. It is not intended to diagnose MRSA infection nor to guide or monitor treatment for MRSA infections.      Radiology Studies: Dg Chest 2 View  Result Date: 02/29/2016 CLINICAL DATA:  Cough EXAM: CHEST  2 VIEW COMPARISON:  Jul 08, 2010 FINDINGS: There is no edema or consolidation. Heart size and pulmonary vascularity are normal. No adenopathy. There is atherosclerotic calcification in the aorta. There is degenerative change in the thoracic spine. IMPRESSION: No edema or consolidation.  There is aortic atherosclerosis. Electronically Signed   By: Lowella Grip III M.D.   On: 02/29/2016 11:46    Scheduled Meds: . sodium chloride   Intravenous Once  . albuterol  2.5 mg Nebulization TID  . citalopram  40 mg Oral Daily  . DULoxetine  30 mg Oral [id number]  . gabapentin  600 mg Oral QID  . mouth rinse  15 mL Mouth Rinse BID  . [START ON 03/04/2016] pantoprazole  40 mg Intravenous Q12H  . sodium chloride flush  3 mL Intravenous Q12H   Continuous Infusions: . dextrose 5 % and 0.9 % NaCl with KCl 20 mEq/L 125 mL/hr at 03/01/16 0033  . pantoprozole (PROTONIX) infusion 8 mg/hr (02/29/16 1745)     LOS: 1 day   Nicoya Friel, MD FACP Hospitalist.   If 7PM-7AM, please contact night-coverage [email or web address] Password [facility] 03/01/2016, 8:04 AM

## 2016-03-01 NOTE — Progress Notes (Signed)
Subjective: Cough and wheezing improved with nebulizer treatments. Feels better from a respiratory standpoint. 3 loose black stools on admission to ICU yesterday around 3pm. Patient states she had loose stool overnight but not as dark. Abdominal discomfort improved.   Objective: Vital signs in last 24 hours: Temp:  [97.7 F (36.5 C)-98.7 F (37.1 C)] 98 F (36.7 C) (01/23 0742) Pulse Rate:  [82-111] 88 (01/23 0700) Resp:  [11-20] 11 (01/23 0742) BP: (76-166)/(44-92) 136/82 (01/23 0700) SpO2:  [85 %-100 %] 100 % (01/23 0724) Weight:  [143 lb 11.8 oz (65.2 kg)-145 lb (65.8 kg)] 143 lb 11.8 oz (65.2 kg) (01/23 0500) Last BM Date: 02/29/16 General:   Alert and oriented Head:  Normocephalic and atraumatic. Eyes:  No icterus, sclera clear. Conjuctiva pink.  Mouth:  Without lesions, mucosa pink and moist.  Neck:  Supple, without thyromegaly or masses.  Heart:  S1, S2 present, no murmurs noted.  Lungs: without labored respirations, mild wheeze, improved overall  Abdomen:  Bowel sounds present, soft, non-tender, non-distended. No rebound or guarding Extremities:  Without  edema. Neurologic:  Alert and  oriented x4 Psych:  Alert and cooperative. Normal mood and affect.  Intake/Output from previous day: 01/22 0701 - 01/23 0700 In: 2100 [I.V.:2100] Out: 200 [Urine:200] Intake/Output this shift: No intake/output data recorded.  Lab Results:  Recent Labs  02/29/16 1051 02/29/16 1424 02/29/16 1940 03/01/16 0116  WBC 17.7*  --   --   --   HGB 9.1* 8.4* 8.2* 7.5*  HCT 26.6* 24.2* 23.7* 22.2*  PLT 292  --   --   --    BMET  Recent Labs  02/29/16 1051  NA 132*  K 3.4*  CL 96*  CO2 27  GLUCOSE 142*  BUN 46*  CREATININE 0.90  CALCIUM 8.7*   LFT  Recent Labs  02/29/16 1051  PROT 6.1*  ALBUMIN 3.0*  AST 18  ALT 12*  ALKPHOS 59  BILITOT 0.4    Studies/Results: Dg Chest 2 View  Result Date: 02/29/2016 CLINICAL DATA:  Cough EXAM: CHEST  2 VIEW COMPARISON:  Jul 08, 2010 FINDINGS: There is no edema or consolidation. Heart size and pulmonary vascularity are normal. No adenopathy. There is atherosclerotic calcification in the aorta. There is degenerative change in the thoracic spine. IMPRESSION: No edema or consolidation.  There is aortic atherosclerosis. Electronically Signed   By: Lowella Grip III M.D.   On: 02/29/2016 11:46    Assessment: 61 year old female presenting with likely UGI bleed yesterday and endorsing alka seltzer cold medication daily for last several weeks, Hgb 7.5 this morning and 9.1 on admission yesterday. Melena seems to be tapering. 1 unit PRBCs ordered per attending. From a respiratory standpoint, she is improved clinically and feels better after nebulizer treatments. Appropriate for EGD with Propofol today as per plan yesterday. Discussed with patient the risks and benefits again, and she stated understanding.   As of note, Cdiff negative. GI pathogen panel pending. Ordered due to loose stool at time of admission and prior exposure to antibiotics recently. Influenza panel negative.   Plan: Remain NPO Continue Protonix drip for now EGD with Propofol with Dr. Oneida Alar today Agree with 1 unit PRBCs this morning. Discussed with ICU nursing. Also contacted OR and spoke with Pamala Hurry to update regarding need for 1 unit and clear with anesthesia. Dr. Patsey Berthold aware and would like unit at least hanging by time of procedure if not finished already. Also informed them we were waiting on final  BMP to complete. Timing of case to be determined per OR schedule.  BMP and CBC pending: will follow-up to ensure no electrolyte abnormalities   Annitta Needs, ANP-BC [facility]    LOS: 1 day    03/01/2016, 7:54 AM

## 2016-03-01 NOTE — Anesthesia Preprocedure Evaluation (Addendum)
Anesthesia Evaluation  Patient identified by MRN, date of birth, ID band Patient awake    Reviewed: Allergy & Precautions, NPO status , Patient's Chart, lab work & pertinent test results  Airway Mallampati: III  TM Distance: <3 FB     Dental  (+) Edentulous Upper, Edentulous Lower   Pulmonary COPD, Current Smoker,           Cardiovascular negative cardio ROS   Rhythm:Regular Rate:Tachycardia     Neuro/Psych PSYCHIATRIC DISORDERS Anxiety Depression    GI/Hepatic GI bleed with anemia.   Endo/Other    Renal/GU      Musculoskeletal  (+) Fibromyalgia -  Abdominal   Peds  Hematology  (+) anemia ,   Anesthesia Other Findings   Reproductive/Obstetrics                            Anesthesia Physical Anesthesia Plan  ASA: III and emergent  Anesthesia Plan: MAC   Post-op Pain Management:    Induction: Intravenous  Airway Management Planned: Simple Face Mask  Additional Equipment:   Intra-op Plan:   Post-operative Plan:   Informed Consent: I have reviewed the patients History and Physical, chart, labs and discussed the procedure including the risks, benefits and alternatives for the proposed anesthesia with the patient or authorized representative who has indicated his/her understanding and acceptance.     Plan Discussed with:   Anesthesia Plan Comments: (Transfuse 1 PRBC in preop prior to procedure.)        Anesthesia Quick Evaluation

## 2016-03-02 ENCOUNTER — Telehealth: Payer: Self-pay | Admitting: Gastroenterology

## 2016-03-02 DIAGNOSIS — K250 Acute gastric ulcer with hemorrhage: Principal | ICD-10-CM

## 2016-03-02 LAB — CBC
HCT: 30.5 % — ABNORMAL LOW (ref 36.0–46.0)
HEMOGLOBIN: 10.4 g/dL — AB (ref 12.0–15.0)
MCH: 31.3 pg (ref 26.0–34.0)
MCHC: 34.1 g/dL (ref 30.0–36.0)
MCV: 91.9 fL (ref 78.0–100.0)
Platelets: 310 10*3/uL (ref 150–400)
RBC: 3.32 MIL/uL — ABNORMAL LOW (ref 3.87–5.11)
RDW: 13.7 % (ref 11.5–15.5)
WBC: 12.9 10*3/uL — ABNORMAL HIGH (ref 4.0–10.5)

## 2016-03-02 LAB — TYPE AND SCREEN
Blood Product Expiration Date: 201802192359
ISSUE DATE / TIME: 201801230918
UNIT TYPE AND RH: 9500

## 2016-03-02 MED ORDER — PANTOPRAZOLE SODIUM 40 MG PO TBEC: 40.0000 mg | DELAYED_RELEASE_TABLET | Freq: Two times a day (BID) | ORAL | 2 refills | Status: DC

## 2016-03-02 MED ORDER — METOPROLOL TARTRATE 100 MG PO TABS: 50.0000 mg | ORAL_TABLET | Freq: Two times a day (BID) | ORAL | Status: DC

## 2016-03-02 NOTE — Progress Notes (Signed)
  Subjective:  No complaints. No blood per rectum. Tolerating diet.   Objective: Vital signs in last 24 hours: Temp:  [98.1 F (36.7 C)-98.7 F (37.1 C)] 98.3 F (36.8 C) (01/24 0740) Pulse Rate:  [79-125] 101 (01/24 0740) Resp:  [0-27] 19 (01/24 0740) BP: (81-175)/(45-124) 158/90 (01/24 0600) SpO2:  [92 %-100 %] 95 % (01/24 0740) Weight:  [139 lb 8.8 oz (63.3 kg)] 139 lb 8.8 oz (63.3 kg) (01/24 0500) Last BM Date: 03/01/16 General:   Alert,  Well-developed, well-nourished, pleasant and cooperative in NAD Head:  Normocephalic and atraumatic. Eyes:  Sclera clear, no icterus.  Abdomen:  Soft,   Nondistended.  Extremities:  Without clubbing, deformity or edema. Neurologic:  Alert and  oriented x4;  grossly normal neurologically. Skin:  Intact without significant lesions or rashes. Psych:  Alert and cooperative. Normal mood and affect.  Intake/Output from previous day: 01/23 0701 - 01/24 0700 In: 2816 [P.O.:400; I.V.:1885; Blood:531] Out: 2925 [Urine:2925] Intake/Output this shift: Total I/O In: -  Out: 400 [Urine:400]  Lab Results: CBC  Recent Labs  02/29/16 1051  03/01/16 0855 03/01/16 1318 03/02/16 0834  WBC 17.7*  --  9.9  --  12.9*  HGB 9.1*  < > 7.5* 9.6* 10.4*  HCT 26.6*  < > 22.4* 28.5* 30.5*  MCV 90.5  --  92.6  --  91.9  PLT 292  --  258  --  310  < > = values in this interval not displayed. BMET  Recent Labs  02/29/16 1051 03/01/16 0855  NA 132* 138  K 3.4* 3.5  CL 96* 108  CO2 27 23  GLUCOSE 142* 160*  BUN 46* 12  CREATININE 0.90 0.74  CALCIUM 8.7* 8.3*   LFTs  Recent Labs  02/29/16 1051  BILITOT 0.4  ALKPHOS 59  AST 18  ALT 12*  PROT 6.1*  ALBUMIN 3.0*    Recent Labs  02/29/16 1051  LIPASE 21   PT/INR No results for input(s): LABPROT, INR in the last 72 hours.    Imaging Studies: Dg Chest 2 View  Result Date: 02/29/2016 CLINICAL DATA:  Cough EXAM: CHEST  2 VIEW COMPARISON:  Jul 08, 2010 FINDINGS: There is no edema or  consolidation. Heart size and pulmonary vascularity are normal. No adenopathy. There is atherosclerotic calcification in the aorta. There is degenerative change in the thoracic spine. IMPRESSION: No edema or consolidation.  There is aortic atherosclerosis. Electronically Signed   By: Lowella Grip III M.D.   On: 02/29/2016 11:46  [2 weeks]   Assessment: 61 year old female presenting with melena in setting of alka seltzer cold medication daily for last several weeks. 1 unit PRBCs yesterday with increase of Hgb from 7.5 to 10.4.    EGD 03/01/16 UGIB due to large gastric ulcer with no stigmata of bleeding.   As of note, Cdiff negative. GI pathogen panel negative. Ordered due to loose stool at time of admission and prior exposure to antibiotics recently. Influenza panel negative.   Plan: 1. PPI BID for 3 months.  2. F/U path. 3. No Nsaids. No asa.  4. Repeat EGD in 3 months. With ov in 2 months. Discussed importance of follow up with patient.   Laureen Ochs. Bernarda Caffey [facility] [phone] 1/24/20189:38 AM     LOS: 2 days

## 2016-03-02 NOTE — Telephone Encounter (Signed)
APPOINTMENT MADE AND NURSE ON ICU FLOOR WILL LET PATIENT KNOW OF DATE AND TIME

## 2016-03-02 NOTE — Telephone Encounter (Signed)
Patient needs OV in 2 months for hospital f/u, gastric ulcer, set up EGD with [facility], anna, or me.

## 2016-03-02 NOTE — Discharge Summary (Signed)
Physician Discharge Summary  Brooke Deleon [id number] DOB: Sep 14, 1955 DOA: 02/29/2016  PCP: Celedonio Savage, MD  Admit date: 02/29/2016 Discharge date: 03/02/2016  Admitted From: home Disposition:  home  Recommendations for Outpatient Follow-up:  1. Follow up with PCP in 1-2 weeks 2. Please obtain BMP/CBC in one week 3. Avoid NSAIDS/aspirin 4. Follow up with GI for repeat EGD in  months  Home Health: Equipment/Devices:  Discharge Condition: stable CODE STATUS:full Diet recommendation: Heart Healthy   Brief/Interim Summary: 61 year old female who presents to the hospital 3 days of diarrhea, black stool, nausea and vomiting. Found to have a drop in hemoglobin from 9.1-7.5. Guaiac positive stools. Admitted for further workup of GI bleed.  Discharge Diagnoses:  Principal Problem:   Upper GI bleeding Active Problems:   Diarrhea   COPD with acute exacerbation (HCC)   Pill esophagitis   Upper GI bleed   Anemia   Acute gastric ulcer with hemorrhage  Patient was monitored in the hospital. She was transfused 1 unit of PRBCs with improvement in hemoglobin. Hemoglobin has since been stable. She was seen by gastroenterology and underwent EGD that did show large gastric ulcer without stigmata of bleeding. She was treated with proton pump inhibitors. Hemoglobin has since been stable she's not had any further evidence of bleeding. She is feeling significantly improved. She's been advised to avoid any NSAIDs, aspirin. She'll follow up with GI in the next 2-3 months for repeat EGD. Patient is otherwise stable for discharge.  Discharge Instructions  Discharge Instructions    Diet - low sodium heart healthy    Complete by:  As directed    Increase activity slowly    Complete by:  As directed      Allergies as of 03/02/2016      Reactions   Codeine Rash      Medication List    STOP taking these medications   aspirin 81 MG chewable tablet     TAKE these medications   ALPRAZolam 1 MG  tablet Commonly known as:  XANAX Take 1 mg by mouth 4 (four) times daily.   citalopram 40 MG tablet Commonly known as:  CELEXA Take 40 mg by mouth daily.   desmopressin 0.2 MG tablet Commonly known as:  DDAVP Take 0.2 mg by mouth at bedtime. Patient had a prescription written on 01/04/11 from Dr. Michela Pitcher  For this med to be taken for 2 weeks, but patient was already taking what she had at home.   DULoxetine 30 MG capsule Commonly known as:  CYMBALTA Take 30 mg by mouth every morning.   gabapentin 600 MG tablet Commonly known as:  NEURONTIN Take 600 mg by mouth 4 (four) times daily.   methocarbamol 750 MG tablet Commonly known as:  ROBAXIN Take 750 mg by mouth 3 (three) times daily.   metoprolol 100 MG tablet Commonly known as:  LOPRESSOR Take 0.5 tablets (50 mg total) by mouth 2 (two) times daily. What changed:  how much to take  when to take this   oxyCODONE-acetaminophen 10-325 MG tablet Commonly known as:  PERCOCET Take 1 tablet by mouth 3 (three) times daily.   pantoprazole 40 MG tablet Commonly known as:  PROTONIX Take 1 tablet (40 mg total) by mouth 2 (two) times daily before a meal.      Follow-up Information    Barney Drain, MD. Go on 04/28/2016.   Specialty:  Gastroenterology Why:  Please follow up at 1:30 p.m. with Neil Crouch. Contact information: [address]  [place] [address] [place] [address] [phone]          Allergies  Allergen Reactions  . Codeine Rash    Consultations:  Gastroenterology   Procedures/Studies: Dg Chest 2 View  Result Date: 02/29/2016 CLINICAL DATA:  Cough EXAM: CHEST  2 VIEW COMPARISON:  Jul 08, 2010 FINDINGS: There is no edema or consolidation. Heart size and pulmonary vascularity are normal. No adenopathy. There is atherosclerotic calcification in the aorta. There is degenerative change in the thoracic spine. IMPRESSION: No edema or consolidation.  There is aortic atherosclerosis. Electronically Signed   By: Lowella Grip III M.D.   On: 02/29/2016 11:46    EGD 1/23: UGIB DUE TO LARGE gastric ulcer with no stigmata                            of bleeding.   Subjective: No blood per rectum, no dizziness, no vomiting.  Discharge Exam: Vitals:   03/02/16 1153 03/02/16 1200  BP:  (!) 152/106  Pulse:  94  Resp:  (!) 25  Temp: 98.9 F (37.2 C)    Vitals:   03/02/16 1000 03/02/16 1100 03/02/16 1153 03/02/16 1200  BP: (!) 135/121 (!) 153/89  (!) 152/106  Pulse:  87  94  Resp: 14 12  (!) 25  Temp:   98.9 F (37.2 C)   TempSrc:   Oral   SpO2:  93%  (!) 68%  Weight:      Height:        General: Pt is alert, awake, not in acute distress Cardiovascular: RRR, S1/S2 +, no rubs, no gallops Respiratory: CTA bilaterally, no wheezing, no rhonchi Abdominal: Soft, NT, ND, bowel sounds + Extremities: no edema, no cyanosis    The results of significant diagnostics from this hospitalization (including imaging, microbiology, ancillary and laboratory) are listed below for reference.     Microbiology: Recent Results (from the past 240 hour(s))  Gastrointestinal Panel by PCR , Stool     Status: None   Collection Time: 02/29/16 10:44 AM  Result Value Ref Range Status   Campylobacter species NOT DETECTED NOT DETECTED Final   Plesimonas shigelloides NOT DETECTED NOT DETECTED Final   Salmonella species NOT DETECTED NOT DETECTED Final   Yersinia enterocolitica NOT DETECTED NOT DETECTED Final   Vibrio species NOT DETECTED NOT DETECTED Final   Vibrio cholerae NOT DETECTED NOT DETECTED Final   Enteroaggregative E coli (EAEC) NOT DETECTED NOT DETECTED Final   Enteropathogenic E coli (EPEC) NOT DETECTED NOT DETECTED Final   Enterotoxigenic E coli (ETEC) NOT DETECTED NOT DETECTED Final   Shiga like toxin producing E coli (STEC) NOT DETECTED NOT DETECTED Final   Shigella/Enteroinvasive E coli (EIEC) NOT DETECTED NOT DETECTED Final   Cryptosporidium NOT DETECTED NOT DETECTED Final   Cyclospora cayetanensis  NOT DETECTED NOT DETECTED Final   Entamoeba histolytica NOT DETECTED NOT DETECTED Final   Giardia lamblia NOT DETECTED NOT DETECTED Final   Adenovirus F40/41 NOT DETECTED NOT DETECTED Final   Astrovirus NOT DETECTED NOT DETECTED Final   Norovirus GI/GII NOT DETECTED NOT DETECTED Final   Rotavirus A NOT DETECTED NOT DETECTED Final   Sapovirus (I, II, IV, and V) NOT DETECTED NOT DETECTED Final  C difficile quick scan w PCR reflex     Status: None   Collection Time: 02/29/16 11:10 AM  Result Value Ref Range Status   C Diff antigen NEGATIVE NEGATIVE Final   C Diff toxin NEGATIVE NEGATIVE  Final   C Diff interpretation No C. difficile detected.  Final  MRSA PCR Screening     Status: None   Collection Time: 02/29/16  2:06 PM  Result Value Ref Range Status   MRSA by PCR NEGATIVE NEGATIVE Final    Comment:        The GeneXpert MRSA Assay (FDA approved for NASAL specimens only), is one component of a comprehensive MRSA colonization surveillance program. It is not intended to diagnose MRSA infection nor to guide or monitor treatment for MRSA infections.      Labs: BNP (last 3 results) No results for input(s): BNP in the last 8760 hours. Basic Metabolic Panel:  Recent Labs Lab 02/29/16 1051 03/01/16 0855  NA 132* 138  K 3.4* 3.5  CL 96* 108  CO2 27 23  GLUCOSE 142* 160*  BUN 46* 12  CREATININE 0.90 0.74  CALCIUM 8.7* 8.3*   Liver Function Tests:  Recent Labs Lab 02/29/16 1051  AST 18  ALT 12*  ALKPHOS 59  BILITOT 0.4  PROT 6.1*  ALBUMIN 3.0*    Recent Labs Lab 02/29/16 1051  LIPASE 21   No results for input(s): AMMONIA in the last 168 hours. CBC:  Recent Labs Lab 02/29/16 1051  02/29/16 1940 03/01/16 0116 03/01/16 0855 03/01/16 1318 03/02/16 0834  WBC 17.7*  --   --   --  9.9  --  12.9*  NEUTROABS 15.3*  --   --   --   --   --   --   HGB 9.1*  < > 8.2* 7.5* 7.5* 9.6* 10.4*  HCT 26.6*  < > 23.7* 22.2* 22.4* 28.5* 30.5*  MCV 90.5  --   --   --  92.6   --  91.9  PLT 292  --   --   --  258  --  310  < > = values in this interval not displayed. Cardiac Enzymes: No results for input(s): CKTOTAL, CKMB, CKMBINDEX, TROPONINI in the last 168 hours. BNP: Invalid input(s): POCBNP CBG: No results for input(s): GLUCAP in the last 168 hours. D-Dimer No results for input(s): DDIMER in the last 72 hours. Hgb A1c No results for input(s): HGBA1C in the last 72 hours. Lipid Profile No results for input(s): CHOL, HDL, LDLCALC, TRIG, CHOLHDL, LDLDIRECT in the last 72 hours. Thyroid function studies  Recent Labs  02/29/16 1051  TSH 0.865   Anemia work up No results for input(s): VITAMINB12, FOLATE, FERRITIN, TIBC, IRON, RETICCTPCT in the last 72 hours. Urinalysis    Component Value Date/Time   COLORURINE STRAW (A) 02/29/2016 1044   APPEARANCEUR CLEAR 02/29/2016 1044   LABSPEC 1.012 02/29/2016 1044   PHURINE 7.0 02/29/2016 1044   GLUCOSEU NEGATIVE 02/29/2016 1044   HGBUR MODERATE (A) 02/29/2016 1044   BILIRUBINUR NEGATIVE 02/29/2016 1044   KETONESUR NEGATIVE 02/29/2016 1044   PROTEINUR NEGATIVE 02/29/2016 1044   NITRITE NEGATIVE 02/29/2016 1044   LEUKOCYTESUR NEGATIVE 02/29/2016 1044   Sepsis Labs Invalid input(s): PROCALCITONIN,  WBC,  LACTICIDVEN Microbiology Recent Results (from the past 240 hour(s))  Gastrointestinal Panel by PCR , Stool     Status: None   Collection Time: 02/29/16 10:44 AM  Result Value Ref Range Status   Campylobacter species NOT DETECTED NOT DETECTED Final   Plesimonas shigelloides NOT DETECTED NOT DETECTED Final   Salmonella species NOT DETECTED NOT DETECTED Final   Yersinia enterocolitica NOT DETECTED NOT DETECTED Final   Vibrio species NOT DETECTED NOT DETECTED Final   Vibrio  cholerae NOT DETECTED NOT DETECTED Final   Enteroaggregative E coli (EAEC) NOT DETECTED NOT DETECTED Final   Enteropathogenic E coli (EPEC) NOT DETECTED NOT DETECTED Final   Enterotoxigenic E coli (ETEC) NOT DETECTED NOT DETECTED  Final   Shiga like toxin producing E coli (STEC) NOT DETECTED NOT DETECTED Final   Shigella/Enteroinvasive E coli (EIEC) NOT DETECTED NOT DETECTED Final   Cryptosporidium NOT DETECTED NOT DETECTED Final   Cyclospora cayetanensis NOT DETECTED NOT DETECTED Final   Entamoeba histolytica NOT DETECTED NOT DETECTED Final   Giardia lamblia NOT DETECTED NOT DETECTED Final   Adenovirus F40/41 NOT DETECTED NOT DETECTED Final   Astrovirus NOT DETECTED NOT DETECTED Final   Norovirus GI/GII NOT DETECTED NOT DETECTED Final   Rotavirus A NOT DETECTED NOT DETECTED Final   Sapovirus (I, II, IV, and V) NOT DETECTED NOT DETECTED Final  C difficile quick scan w PCR reflex     Status: None   Collection Time: 02/29/16 11:10 AM  Result Value Ref Range Status   C Diff antigen NEGATIVE NEGATIVE Final   C Diff toxin NEGATIVE NEGATIVE Final   C Diff interpretation No C. difficile detected.  Final  MRSA PCR Screening     Status: None   Collection Time: 02/29/16  2:06 PM  Result Value Ref Range Status   MRSA by PCR NEGATIVE NEGATIVE Final    Comment:        The GeneXpert MRSA Assay (FDA approved for NASAL specimens only), is one component of a comprehensive MRSA colonization surveillance program. It is not intended to diagnose MRSA infection nor to guide or monitor treatment for MRSA infections.      Time coordinating discharge: Over 30 minutes  SIGNED:   Kathie Dike, MD  [facility] 03/02/2016, 12:48 PM Pager   If 7PM-7AM, please contact night-coverage [email or web address] Password [email or web address]

## 2016-03-02 NOTE — Telephone Encounter (Signed)
Please make the appt with RMR, AWB, or LSL. Thanks!

## 2016-03-02 NOTE — Progress Notes (Signed)
DISCHARGE INSTRUCTIONS GIVEN. NO SX OF GI BLEEDING SINCE EDG PREFORMED. PT ALERT AND ORIENTED. TOLERATING DIET WELL. TO BE DISCHARGED HOME ACC

## 2016-03-03 ENCOUNTER — Encounter (HOSPITAL_COMMUNITY): Payer: Self-pay | Admitting: Gastroenterology

## 2016-03-04 ENCOUNTER — Telehealth: Payer: Self-pay | Admitting: Gastroenterology

## 2016-03-04 NOTE — Telephone Encounter (Signed)
Please call pt. HER stomach Bx shows ULCERS AND gastritis DUE TO ASPIRIN USE. CONTINUE PROTONIX BID. OPV IN 2 MOS W/ RGA. REPEAT EGD IN 3 MOS TO MAKE SURE HER ULCER HAS HEALED.

## 2016-03-07 NOTE — Telephone Encounter (Signed)
OV made °

## 2016-03-08 NOTE — Telephone Encounter (Signed)
Tried to call pt- NA 

## 2016-03-09 NOTE — Telephone Encounter (Signed)
Tried to call pt- NA- got voicemail- LM with recommendations.

## 2016-03-10 NOTE — Telephone Encounter (Signed)
[facility]

## 2016-03-11 NOTE — Telephone Encounter (Signed)
Letter mailed to the pt. 

## 2016-04-08 DIAGNOSIS — N393 Stress incontinence (female) (male): Secondary | ICD-10-CM | POA: Diagnosis not present

## 2016-04-08 DIAGNOSIS — Q059 Spina bifida, unspecified: Secondary | ICD-10-CM | POA: Diagnosis not present

## 2016-04-08 DIAGNOSIS — Z79899 Other long term (current) drug therapy: Secondary | ICD-10-CM | POA: Diagnosis not present

## 2016-04-08 DIAGNOSIS — J449 Chronic obstructive pulmonary disease, unspecified: Secondary | ICD-10-CM | POA: Diagnosis not present

## 2016-04-08 DIAGNOSIS — F17200 Nicotine dependence, unspecified, uncomplicated: Secondary | ICD-10-CM | POA: Diagnosis not present

## 2016-04-08 DIAGNOSIS — M545 Low back pain: Secondary | ICD-10-CM | POA: Diagnosis not present

## 2016-04-08 DIAGNOSIS — F329 Major depressive disorder, single episode, unspecified: Secondary | ICD-10-CM | POA: Diagnosis not present

## 2016-04-08 DIAGNOSIS — M797 Fibromyalgia: Secondary | ICD-10-CM | POA: Diagnosis not present

## 2016-04-08 DIAGNOSIS — I10 Essential (primary) hypertension: Secondary | ICD-10-CM | POA: Diagnosis not present

## 2016-04-08 DIAGNOSIS — M25519 Pain in unspecified shoulder: Secondary | ICD-10-CM | POA: Diagnosis not present

## 2016-04-08 DIAGNOSIS — R2681 Unsteadiness on feet: Secondary | ICD-10-CM | POA: Diagnosis not present

## 2016-04-08 DIAGNOSIS — G894 Chronic pain syndrome: Secondary | ICD-10-CM | POA: Diagnosis not present

## 2016-04-15 DIAGNOSIS — Z124 Encounter for screening for malignant neoplasm of cervix: Secondary | ICD-10-CM | POA: Diagnosis not present

## 2016-04-15 DIAGNOSIS — Z1231 Encounter for screening mammogram for malignant neoplasm of breast: Secondary | ICD-10-CM | POA: Diagnosis not present

## 2016-04-15 DIAGNOSIS — Z0000 Encounter for general adult medical examination without abnormal findings: Secondary | ICD-10-CM | POA: Diagnosis not present

## 2016-04-15 DIAGNOSIS — Z23 Encounter for immunization: Secondary | ICD-10-CM | POA: Diagnosis not present

## 2016-04-15 DIAGNOSIS — Z1211 Encounter for screening for malignant neoplasm of colon: Secondary | ICD-10-CM | POA: Diagnosis not present

## 2016-04-15 DIAGNOSIS — J449 Chronic obstructive pulmonary disease, unspecified: Secondary | ICD-10-CM | POA: Diagnosis not present

## 2016-04-15 DIAGNOSIS — E7801 Familial hypercholesterolemia: Secondary | ICD-10-CM | POA: Diagnosis not present

## 2016-04-15 DIAGNOSIS — I10 Essential (primary) hypertension: Secondary | ICD-10-CM | POA: Diagnosis not present

## 2016-04-15 DIAGNOSIS — Z720 Tobacco use: Secondary | ICD-10-CM | POA: Diagnosis not present

## 2016-04-15 DIAGNOSIS — F418 Other specified anxiety disorders: Secondary | ICD-10-CM | POA: Diagnosis not present

## 2016-04-15 DIAGNOSIS — K219 Gastro-esophageal reflux disease without esophagitis: Secondary | ICD-10-CM | POA: Diagnosis not present

## 2016-04-15 DIAGNOSIS — Q760 Spina bifida occulta: Secondary | ICD-10-CM | POA: Diagnosis not present

## 2016-04-15 DIAGNOSIS — E559 Vitamin D deficiency, unspecified: Secondary | ICD-10-CM | POA: Diagnosis not present

## 2016-04-26 ENCOUNTER — Ambulatory Visit (INDEPENDENT_AMBULATORY_CARE_PROVIDER_SITE_OTHER): Payer: PPO | Admitting: Orthopaedic Surgery

## 2016-04-26 ENCOUNTER — Ambulatory Visit: Payer: PPO

## 2016-04-26 ENCOUNTER — Ambulatory Visit (INDEPENDENT_AMBULATORY_CARE_PROVIDER_SITE_OTHER): Payer: PPO

## 2016-04-26 VITALS — BP 131/83 | HR 73 | Temp 97.5°F | Ht 60.0 in | Wt 142.0 lb

## 2016-04-26 DIAGNOSIS — M25562 Pain in left knee: Secondary | ICD-10-CM | POA: Diagnosis not present

## 2016-04-26 DIAGNOSIS — F17210 Nicotine dependence, cigarettes, uncomplicated: Secondary | ICD-10-CM | POA: Diagnosis not present

## 2016-04-26 DIAGNOSIS — G8929 Other chronic pain: Secondary | ICD-10-CM | POA: Diagnosis not present

## 2016-04-26 DIAGNOSIS — M25561 Pain in right knee: Secondary | ICD-10-CM

## 2016-04-26 DIAGNOSIS — M25511 Pain in right shoulder: Secondary | ICD-10-CM

## 2016-04-26 NOTE — Patient Instructions (Addendum)
Steps to Quit Smoking Smoking tobacco can be bad for your health. It can also affect almost every organ in your body. Smoking puts you and people around you at risk for many serious Brooke Deleon-lasting (chronic) diseases. Quitting smoking is hard, but it is one of the best things that you can do for your health. It is never too late to quit. What are the benefits of quitting smoking? When you quit smoking, you lower your risk for getting serious diseases and conditions. They can include:  Lung cancer or lung disease.  Heart disease.  Stroke.  Heart attack.  Not being able to have children (infertility).  Weak bones (osteoporosis) and broken bones (fractures). If you have coughing, wheezing, and shortness of breath, those symptoms may get better when you quit. You may also get sick less often. If you are pregnant, quitting smoking can help to lower your chances of having a baby of low birth weight. What can I do to help me quit smoking? Talk with your doctor about what can help you quit smoking. Some things you can do (strategies) include:  Quitting smoking totally, instead of slowly cutting back how much you smoke over a period of time.  Going to in-person counseling. You are more likely to quit if you go to many counseling sessions.  Using resources and support systems, such as:  Online chats with a counselor.  Phone quitlines.  Printed self-help materials.  Support groups or group counseling.  Text messaging programs.  Mobile phone apps or applications.  Taking medicines. Some of these medicines may have nicotine in them. If you are pregnant or breastfeeding, do not take any medicines to quit smoking unless your doctor says it is okay. Talk with your doctor about counseling or other things that can help you. Talk with your doctor about using more than one strategy at the same time, such as taking medicines while you are also going to in-person counseling. This can help make quitting  easier. What things can I do to make it easier to quit? Quitting smoking might feel very hard at first, but there is a lot that you can do to make it easier. Take these steps:  Talk to your family and friends. Ask them to support and encourage you.  Call phone quitlines, reach out to support groups, or work with a counselor.  Ask people who smoke to not smoke around you.  Avoid places that make you want (trigger) to smoke, such as:  Bars.  Parties.  Smoke-break areas at work.  Spend time with people who do not smoke.  Lower the stress in your life. Stress can make you want to smoke. Try these things to help your stress:  Getting regular exercise.  Deep-breathing exercises.  Yoga.  Meditating.  Doing a body scan. To do this, close your eyes, focus on one area of your body at a time from head to toe, and notice which parts of your body are tense. Try to relax the muscles in those areas.  Download or buy apps on your mobile phone or tablet that can help you stick to your quit plan. There are many free apps, such as QuitGuide from the CDC (Centers for Disease Control and Prevention). You can find more support from smokefree.gov and other websites. This information is not intended to replace advice given to you by your health care provider. Make sure you discuss any questions you have with your health care provider. Document Released: 11/20/2008 Document Revised: 09/22/2015 Document   Reviewed: 06/10/2014 [employer] Interactive Patient Education  2017 [employer]  
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
## 2016-04-28 ENCOUNTER — Telehealth: Payer: Self-pay

## 2016-04-28 ENCOUNTER — Encounter: Payer: Self-pay | Admitting: Gastroenterology

## 2016-04-28 ENCOUNTER — Other Ambulatory Visit: Payer: Self-pay

## 2016-04-28 ENCOUNTER — Ambulatory Visit (INDEPENDENT_AMBULATORY_CARE_PROVIDER_SITE_OTHER): Payer: PPO | Admitting: Gastroenterology

## 2016-04-28 VITALS — BP 159/95 | HR 65 | Temp 98.0°F | Ht 60.0 in | Wt 139.4 lb

## 2016-04-28 DIAGNOSIS — D62 Acute posthemorrhagic anemia: Secondary | ICD-10-CM

## 2016-04-28 DIAGNOSIS — K5900 Constipation, unspecified: Secondary | ICD-10-CM | POA: Diagnosis not present

## 2016-04-28 DIAGNOSIS — R1310 Dysphagia, unspecified: Secondary | ICD-10-CM | POA: Diagnosis not present

## 2016-04-28 DIAGNOSIS — K250 Acute gastric ulcer with hemorrhage: Secondary | ICD-10-CM | POA: Diagnosis not present

## 2016-04-28 DIAGNOSIS — R1319 Other dysphagia: Secondary | ICD-10-CM

## 2016-04-28 DIAGNOSIS — Z8719 Personal history of other diseases of the digestive system: Secondary | ICD-10-CM

## 2016-04-28 DIAGNOSIS — D649 Anemia, unspecified: Secondary | ICD-10-CM

## 2016-04-28 DIAGNOSIS — Z8711 Personal history of peptic ulcer disease: Secondary | ICD-10-CM

## 2016-04-28 MED ORDER — LUBIPROSTONE 24 MCG PO CAPS: 24.0000 ug | ORAL_CAPSULE | Freq: Two times a day (BID) | ORAL | 5 refills | Status: DC

## 2016-04-28 NOTE — Telephone Encounter (Signed)
PA info for EGD/DIL faxed to [facility] [employer].
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
## 2016-04-28 NOTE — Patient Instructions (Addendum)
1. Upper endoscopy as scheduled. See separate instructions.  2. Start Amitiza 38mcg one to two times daily for constipation. Samples provided. RX sent to pharmacy.  3. I will request copy of recent labs from PCP. We will call you if additional labs are needed.

## 2016-04-28 NOTE — Assessment & Plan Note (Signed)
61 year old female with history of upper GI bleed secondary to large gastric ulcer in the setting of Alka-Seltzer and aspirin use. Presents to schedule 3 month follow-up EGD. Clinically feels well. No further aspirin or NSAID use. Reports recent labs from PCP normal. We have requested a copy. She does complain of chronic esophageal dysphagia. No noted esophageal stricture on prior EGD but may benefit from empiric dilation.  Plan for EGD with dilation in the near future, deep sedation in the OR given polypharmacy. I have discussed the risks, alternatives, benefits with regards to but not limited to the risk of reaction to medication, bleeding, infection, perforation and the patient is agreeable to proceed. Written consent to be obtained.  I have requested a copy of her most recent labs to see if hemoglobin is return to baseline.

## 2016-04-28 NOTE — Telephone Encounter (Signed)
Called and informed pt of pre-op appt 05/25/16 at 11:00am. Letter also mailed.

## 2016-04-28 NOTE — Assessment & Plan Note (Signed)
Likely opioid induced. Beginning Amitiza 24 g twice a day. Samples and prescription provided.  She reports prior colonoscopy. We have requested records from [facility] for review.

## 2016-04-29 NOTE — Progress Notes (Signed)
CC'ED TO PCP 

## 2016-05-03 ENCOUNTER — Encounter: Payer: Self-pay | Admitting: Gastroenterology

## 2016-05-03 NOTE — Progress Notes (Signed)
ON RECALL  °

## 2016-05-03 NOTE — Progress Notes (Addendum)
Labs from March 2018 from PCP Hemoglobin 12.8, hematocrit 40.5, MCV 95.7, white blood cell count 8800, platelets 307,000, BUN 9, creatinine 0.82, total bilirubin 0.3, alkaline phosphatase 99, AST 22, ALT 9, albumin 3.6 Labs were not scanned due to quality of the copy.  Colonoscopy report from January 2014, Dr. Doristine Mango reported normal.  PLEASE NIC FOR TCS IN 02/2022.

## 2016-05-05 NOTE — Telephone Encounter (Signed)
Received fax from [facility]. EGD/DIL approved. Auth# [id number]. 04/28/16-07/27/16.

## 2016-05-19 NOTE — Patient Instructions (Signed)
Brooke Deleon  05/19/2016     @PREFPERIOPPHARMACY @   Your procedure is scheduled on 05/30/2016.  Report to [name] at 6:45 A.M.  Call this number if you have problems the morning of surgery:  [phone]   Remember:  Do not eat food or drink liquids after midnight.  Take these medicines the morning of surgery with A SIP OF WATER Xanax, Celexa, Desmopressin, Cymbalta, Gabapentin, Robaxin, Metoprolol,  Protonix, Oxycodone if needed   Do not wear jewelry, make-up or nail polish.  Do not wear lotions, powders, or perfumes, or deoderant.  Do not shave 48 hours prior to surgery.  Men may shave face and neck.  Do not bring valuables to the hospital.  [facility] is not responsible for any belongings or valuables.  Contacts, dentures or bridgework may not be worn into surgery.  Leave your suitcase in the car.  After surgery it may be brought to your room.  For patients admitted to the hospital, discharge time will be determined by your treatment team.  Patients discharged the day of surgery will not be allowed to drive home.    Please read over the following fact sheets that you were given. Anesthesia Post-op Instructions     PATIENT INSTRUCTIONS POST-ANESTHESIA  IMMEDIATELY FOLLOWING SURGERY:  Do not drive or operate machinery for the first twenty four hours after surgery.  Do not make any important decisions for twenty four hours after surgery or while taking narcotic pain medications or sedatives.  If you develop intractable nausea and vomiting or a severe headache please notify your doctor immediately.  FOLLOW-UP:  Please make an appointment with your surgeon as instructed. You do not need to follow up with anesthesia unless specifically instructed to do so.  WOUND CARE INSTRUCTIONS (if applicable):  Keep a dry clean dressing on the anesthesia/puncture wound site if there is drainage.  Once the wound has quit draining you may leave it open to air.  Generally you should  leave the bandage intact for twenty four hours unless there is drainage.  If the epidural site drains for more than 36-48 hours please call the anesthesia department.  QUESTIONS?:  Please feel free to call your physician or the hospital operator if you have any questions, and they will be happy to assist you.      Esophagogastroduodenoscopy Esophagogastroduodenoscopy (EGD) is a procedure to examine the lining of the esophagus, stomach, and first part of the small intestine (duodenum). This procedure is done to check for problems such as inflammation, bleeding, ulcers, or growths. During this procedure, a long, flexible, lighted tube with a camera attached (endoscope) is inserted down the throat. Tell a health care provider about:  Any allergies you have.  All medicines you are taking, including vitamins, herbs, eye drops, creams, and over-the-counter medicines.  Any problems you or family members have had with anesthetic medicines.  Any blood disorders you have.  Any surgeries you have had.  Any medical conditions you have.  Whether you are pregnant or may be pregnant. What are the risks? Generally, this is a safe procedure. However, problems may occur, including:  Infection.  Bleeding.  A tear (perforation) in the esophagus, stomach, or duodenum.  Trouble breathing.  Excessive sweating.  Spasms of the larynx.  A slowed heartbeat.  Low blood pressure. What happens before the procedure?  Follow instructions from your health care provider about eating or drinking restrictions.  Ask your health care provider about:  Changing or stopping your regular  medicines. This is especially important if you are taking diabetes medicines or blood thinners.  Taking medicines such as aspirin and ibuprofen. These medicines can thin your blood. Do not take these medicines before your procedure if your health care provider instructs you not to.  Plan to have someone take you home after  the procedure.  If you wear dentures, be ready to remove them before the procedure. What happens during the procedure?  To reduce your risk of infection, your health care team will wash or sanitize their hands.  An IV tube will be put in a vein in your hand or arm. You will get medicines and fluids through this tube.  You will be given one or more of the following:  A medicine to help you relax (sedative).  A medicine to numb the area (local anesthetic). This medicine may be sprayed into your throat. It will make you feel more comfortable and keep you from gagging or coughing during the procedure.  A medicine for pain.  A mouth guard may be placed in your mouth to protect your teeth and to keep you from biting on the endoscope.  You will be asked to lie on your left side.  The endoscope will be lowered down your throat into your esophagus, stomach, and duodenum.  Air will be put into the endoscope. This will help your health care provider see better.  The lining of your esophagus, stomach, and duodenum will be examined.  Your health care provider may:  Take a tissue sample so it can be looked at in a lab (biopsy).  Remove growths.  Remove objects (foreign bodies) that are stuck.  Treat any bleeding with medicines or other devices that stop tissue from bleeding.  Widen (dilate) or stretch narrowed areas of your esophagus and stomach.  The endoscope will be taken out. The procedure may vary among health care providers and hospitals. What happens after the procedure?  Your blood pressure, heart rate, breathing rate, and blood oxygen level will be monitored often until the medicines you were given have worn off.  Do not eat or drink anything until the numbing medicine has worn off and your gag reflex has returned. This information is not intended to replace advice given to you by your health care provider. Make sure you discuss any questions you have with your health care  provider. Document Released: 05/27/2004 Document Revised: 07/02/2015 Document Reviewed: 12/18/2014 Elsevier Interactive Patient Education  2017 [employer] Esophageal Dilatation Esophageal dilatation is a procedure to open a blocked or narrowed part of the esophagus. The esophagus is the long tube in your throat that carries food and liquid from your mouth to your stomach. The procedure is also called esophageal dilation. You may need this procedure if you have a buildup of scar tissue in your esophagus that makes it difficult, painful, or even impossible to swallow. This can be caused by gastroesophageal reflux disease (GERD). In rare cases, people need this procedure because they have cancer of the esophagus or a problem with the way food moves through the esophagus. Sometimes you may need to have another dilatation to enlarge the opening of the esophagus gradually. Tell a health care provider about:  Any allergies you have.  All medicines you are taking, including vitamins, herbs, eye drops, creams, and over-the-counter medicines.  Any problems you or family members have had with anesthetic medicines.  Any blood disorders you have.  Any surgeries you have had.  Any medical conditions you have.  Any antibiotic medicines you are required to take before dental procedures. What are the risks? Generally, this is a safe procedure. However, problems can occur and include:  Bleeding from a tear in the lining of the esophagus.  A hole (perforation) in the esophagus. What happens before the procedure?  Do not eat or drink anything after midnight on the night before the procedure or as directed by your health care provider.  Ask your health care provider about changing or stopping your regular medicines. This is especially important if you are taking diabetes medicines or blood thinners.  Plan to have someone take you home after the procedure. What happens during the procedure?  You  will be given a medicine that makes you relaxed and sleepy (sedative).  A medicine may be sprayed or gargled to numb the back of the throat.  Your health care provider can use various instruments to do an esophageal dilatation. During the procedure, the instrument used will be placed in your mouth and passed down into your esophagus. Options include:  Simple dilators. This instrument is carefully placed in the esophagus to stretch it.  Guided wire bougies. In this method, a flexible tube (endoscope) is used to insert a wire into the esophagus. The dilator is passed over this wire to enlarge the esophagus. Then the wire is removed.  Balloon dilators. An endoscope with a small balloon at the end is passed down into the esophagus. Inflating the balloon gently stretches the esophagus and opens it up. What happens after the procedure?  Your blood pressure, heart rate, breathing rate, and blood oxygen level will be monitored often until the medicines you were given have worn off.  Your throat may feel slightly sore and will probably still feel numb. This will improve slowly over time.  You will not be allowed to eat or drink until the throat numbness has resolved.  If this is a same-day procedure, you may be allowed to go home once you have been able to drink, urinate, and sit on the edge of the bed without nausea or dizziness.  If this is a same-day procedure, you should have a friend or family member with you for the next 24 hours after the procedure. This information is not intended to replace advice given to you by your health care provider. Make sure you discuss any questions you have with your health care provider. Document Released: 03/17/2005 Document Revised: 07/02/2015 Document Reviewed: 06/05/2013 Elsevier Interactive Patient Education  2017 [employer].

## 2016-05-25 ENCOUNTER — Encounter (HOSPITAL_COMMUNITY)
Admission: RE | Admit: 2016-05-25 | Discharge: 2016-05-25 | Disposition: A | Discharge status: Home / Self Care | Payer: PPO | Source: Ambulatory Visit | Attending: Internal Medicine | Admitting: Internal Medicine

## 2016-05-25 ENCOUNTER — Other Ambulatory Visit: Payer: Self-pay

## 2016-05-25 ENCOUNTER — Encounter (HOSPITAL_COMMUNITY): Payer: Self-pay

## 2016-05-25 DIAGNOSIS — Z01818 Encounter for other preprocedural examination: Secondary | ICD-10-CM | POA: Insufficient documentation

## 2016-05-25 DIAGNOSIS — R9431 Abnormal electrocardiogram [ECG] [EKG]: Secondary | ICD-10-CM | POA: Insufficient documentation

## 2016-05-25 DIAGNOSIS — I493 Ventricular premature depolarization: Secondary | ICD-10-CM | POA: Diagnosis not present

## 2016-05-25 DIAGNOSIS — Z8719 Personal history of other diseases of the digestive system: Secondary | ICD-10-CM | POA: Insufficient documentation

## 2016-05-25 DIAGNOSIS — I10 Essential (primary) hypertension: Secondary | ICD-10-CM | POA: Insufficient documentation

## 2016-05-25 DIAGNOSIS — D6489 Other specified anemias: Secondary | ICD-10-CM | POA: Insufficient documentation

## 2016-05-25 HISTORY — DX: Polyneuropathy, unspecified: G62.9

## 2016-05-25 HISTORY — DX: Anemia, unspecified: D64.9

## 2016-05-25 HISTORY — DX: Unspecified osteoarthritis, unspecified site: M19.90

## 2016-05-25 HISTORY — DX: Gastro-esophageal reflux disease without esophagitis: K21.9

## 2016-05-25 LAB — CBC
HCT: 42.5 % (ref 36.0–46.0)
Hemoglobin: 14 g/dL (ref 12.0–15.0)
MCH: 30.4 pg (ref 26.0–34.0)
MCHC: 32.9 g/dL (ref 30.0–36.0)
MCV: 92.2 fL (ref 78.0–100.0)
PLATELETS: 248 10*3/uL (ref 150–400)
RBC: 4.61 MIL/uL (ref 3.87–5.11)
RDW: 13.4 % (ref 11.5–15.5)
WBC: 7.9 10*3/uL (ref 4.0–10.5)

## 2016-05-25 LAB — BASIC METABOLIC PANEL
Anion gap: 5 (ref 5–15)
BUN: 6 mg/dL (ref 6–20)
CO2: 32 mmol/L (ref 22–32)
Calcium: 9 mg/dL (ref 8.9–10.3)
Chloride: 96 mmol/L — ABNORMAL LOW (ref 101–111)
Creatinine, Ser: 0.78 mg/dL (ref 0.44–1.00)
GFR calc Af Amer: >60 mL/min (ref >60)
GLUCOSE: 71 mg/dL (ref 65–99)
POTASSIUM: 3.6 mmol/L (ref 3.5–5.1)
Sodium: 133 mmol/L — ABNORMAL LOW (ref 135–145)

## 2016-05-26 ENCOUNTER — Ambulatory Visit (INDEPENDENT_AMBULATORY_CARE_PROVIDER_SITE_OTHER): Payer: PPO | Admitting: Orthopaedic Surgery

## 2016-05-26 ENCOUNTER — Encounter: Payer: Self-pay | Admitting: Orthopaedic Surgery

## 2016-05-26 VITALS — BP 105/66 | HR 74 | Temp 97.2°F | Resp 16 | Ht 61.0 in | Wt 143.0 lb

## 2016-05-26 DIAGNOSIS — F17210 Nicotine dependence, cigarettes, uncomplicated: Secondary | ICD-10-CM | POA: Diagnosis not present

## 2016-05-26 DIAGNOSIS — M25511 Pain in right shoulder: Secondary | ICD-10-CM

## 2016-05-26 DIAGNOSIS — M25561 Pain in right knee: Secondary | ICD-10-CM

## 2016-05-26 DIAGNOSIS — G8929 Other chronic pain: Secondary | ICD-10-CM

## 2016-05-26 NOTE — Progress Notes (Signed)
Patient Brooke Deleon, female DOB:02/22/1955, 61 y.o. SLH:[id number]  Chief Complaint  Patient presents with  . Follow-up    Recheck on bilareral knees.    HPI  Brooke Deleon is a 61 y.o. female who has continued pain of the right knee with swelling, popping and now giving way.  She says it is getting worse.  Nothing seems to help.  She hurt the knee about six weeks ago.  Exercise, rest, medicine has not helped.  I will get a MRI of the knee as she has not responded to conservative treatment.  I am worried about a meniscus tear.  She also has chronic pain of the right shoulder with pain during overhead lifting of her hand.  She has no swelling or numbness. HPI  Body mass index is 27.02 kg/m.  ROS  Review of Systems  HENT: Negative for congestion.   Respiratory: Positive for shortness of breath. Negative for cough.   Cardiovascular: Negative for chest pain and leg swelling.  Endocrine: Positive for cold intolerance.  Musculoskeletal: Positive for arthralgias.  Allergic/Immunologic: Positive for environmental allergies.  Psychiatric/Behavioral: The patient is nervous/anxious.     Past Medical History:  Diagnosis Date  . Anemia   . Anxiety   . Arthritis   . COPD (chronic obstructive pulmonary disease) ([place])   . Depression   . Fibromyalgia   . GERD (gastroesophageal reflux disease)   . Neuropathy   . Stress incontinence   . Tachycardia     Past Surgical History:  Procedure Laterality Date  . BIOPSY  03/01/2016   Procedure: BIOPSY;  Surgeon: Danie Binder, MD;  Location: [facility];  Service: Endoscopy;;  gastric bx's  . COLONOSCOPY     Dr. Doristine Mango: normal. next TCS 10 years  . [facility]  [facility]  . ESOPHAGOGASTRODUODENOSCOPY (EGD) WITH PROPOFOL N/A 03/01/2016   Dr. Oneida Alar: Upper GI bleed secondary to large gastric ulcer. Biopsies benign, no H pylori  . INCONTINENCE SURGERY    . SPLENECTOMY, PARTIAL     [facility]sple[facility] due to M[facility]  . TONSILLECTOMY     child  . TUBAL LIGATION      Family History  Problem Relation Age of Onset  . Anesthesia problems Neg Hx   . Hypotension Neg Hx   . Malignant hyperthermia Neg Hx   . Pseudochol deficiency Neg Hx   . Colon cancer Neg Hx     Social History Social History  Substance Use Topics  . Smoking status: Current Every Day Smoker    Packs/day: 1.00    Years: 40.00    Types: Cigarettes  . Smokeless tobacco: Never Used  . Alcohol use No    Allergies  Allergen Reactions  . Codeine Rash    Current Outpatient Prescriptions  Medication Sig Dispense Refill  . ALPRAZolam (XANAX) 1 MG tablet Take 1 mg by mouth 4 (four) times daily.      . citalopram (CELEXA) 40 MG tablet Take 40 mg by mouth daily.      Marland Kitchen desmopressin (DDAVP) 0.2 MG tablet Take 0.2 mg by mouth at bedtime.     . DULoxetine (CYMBALTA) 30 MG capsule Take 30 mg by mouth every morning.      . gabapentin (NEURONTIN) 800 MG tablet Take 800 mg by mouth 4 (four) times daily.     Marland Kitchen lubiprostone (AMITIZA) 24 MCG capsule Take 1 capsule (24 mcg total) by mouth 2 (two) times daily  with a meal. 60 capsule 5  . methocarbamol (ROBAXIN) 750 MG tablet Take 750 mg by mouth 3 (three) times daily.      . metoprolol (LOPRESSOR) 100 MG tablet Take 0.5 tablets (50 mg total) by mouth 2 (two) times daily.    Marland Kitchen oxyCODONE-acetaminophen (PERCOCET) 10-325 MG per tablet Take 1 tablet by mouth 3 (three) times daily.      . pantoprazole (PROTONIX) 40 MG tablet Take 1 tablet (40 mg total) by mouth 2 (two) times daily before a meal. 60 tablet 2   No current facility-administered medications for this visit.      Physical Exam  Blood pressure 105/66, pulse 74, temperature 97.2 F (36.2 C), resp. rate 16, height 5\' 1"  (1.549 m), weight 143 lb (64.9 kg).  Constitutional: overall normal hygiene, normal nutrition, well developed, normal grooming, normal body habitus. Assistive  device:none  Musculoskeletal: gait and station Limp right, muscle tone and strength are normal, no tremors or atrophy is present.  .  Neurological: coordination overall normal.  Deep tendon reflex/nerve stretch intact.  Sensation normal.  Cranial nerves II-XII intact.   Skin:   Normal overall no scars, lesions, ulcers or rashes. No psoriasis.  Psychiatric: Alert and oriented x 3.  Recent memory intact, remote memory unclear.  Normal mood and affect. Well groomed.  Good eye contact.  Cardiovascular: overall no swelling, no varicosities, no edema bilaterally, normal temperatures of the legs and arms, no clubbing, cyanosis and good capillary refill.  Lymphatic: palpation is normal.  The right lower extremity is examined:  Inspection:  Thigh:  Non-tender and no defects  Knee has swelling 1+ effusion.                        Joint tenderness is present                        Patient is tender over the medial joint line  Lower Leg:  Has normal appearance and no tenderness or defects  Ankle:  Non-tender and no defects  Foot:  Non-tender and no defects Range of Motion:  Knee:  Range of motion is: 0-105                        Crepitus is  present  Ankle:  Range of motion is normal. Strength and Tone:  The right lower extremity has normal strength and tone. Stability:  Knee:  The knee has positive medial McMurray sign.  Ankle:  The ankle is stable.  Examination of right Upper Extremity is done.  Inspection:   Overall:  Elbow non-tender without crepitus or defects, forearm non-tender without crepitus or defects, wrist non-tender without crepitus or defects, hand non-tender.    Shoulder: with glenohumeral joint tenderness, without effusion.   Upper arm: without swelling and tenderness   Range of motion:   Overall:  Full range of motion of the elbow, full range of motion of wrist and full range of motion in fingers.   Shoulder:  right  165 degrees forward flexion; 150 degrees abduction;  35 degrees internal rotation, 35 degrees external rotation, 15 degrees extension, 40 degrees adduction.   Stability:   Overall:  Shoulder, elbow and wrist stable   Strength and Tone:   Overall full shoulder muscles strength, full upper arm strength and normal upper arm bulk and tone.   The patient has been educated about the nature of the problem(s) and  counseled on treatment options.  The patient appeared to understand what I have discussed and is in agreement with it.  Encounter Diagnoses  Name Primary?  . Chronic pain of right knee Yes  . Chronic right shoulder pain   . Cigarette nicotine dependence without complication    PROCEDURE NOTE:  The patient request injection, verbal consent was obtained.  The right shoulder was prepped appropriately after time out was performed.   Sterile technique was observed and injection of 1 cc of Depo-Medrol 40 mg with several cc's of plain xylocaine. Anesthesia was provided by ethyl chloride and a 20-gauge needle was used to inject the shoulder area. A posterior approach was used.  The injection was tolerated well.  A band aid dressing was applied.  The patient was advised to apply ice later today and tomorrow to the injection sight as needed.   PLAN Call if any problems.  Precautions discussed.  Continue current medications.   Return to clinic after MRI of the right knee   Electronically Signed Sanjuana Kava, MD 4/19/20183:10 PM

## 2016-05-30 ENCOUNTER — Encounter (HOSPITAL_COMMUNITY)
Admission: RE | Disposition: A | Discharge status: Home / Self Care | Payer: Self-pay | Source: Ambulatory Visit | Attending: Internal Medicine

## 2016-05-30 ENCOUNTER — Ambulatory Visit (HOSPITAL_COMMUNITY): Payer: PPO | Admitting: Anesthesiology

## 2016-05-30 ENCOUNTER — Ambulatory Visit (HOSPITAL_COMMUNITY)
Admission: RE | Admit: 2016-05-30 | Discharge: 2016-05-30 | Disposition: A | Discharge status: Home / Self Care | Payer: PPO | Source: Ambulatory Visit | Attending: Internal Medicine | Admitting: Internal Medicine

## 2016-05-30 ENCOUNTER — Encounter (HOSPITAL_COMMUNITY): Payer: Self-pay

## 2016-05-30 DIAGNOSIS — F329 Major depressive disorder, single episode, unspecified: Secondary | ICD-10-CM | POA: Insufficient documentation

## 2016-05-30 DIAGNOSIS — G629 Polyneuropathy, unspecified: Secondary | ICD-10-CM | POA: Diagnosis not present

## 2016-05-30 DIAGNOSIS — R1310 Dysphagia, unspecified: Secondary | ICD-10-CM | POA: Diagnosis not present

## 2016-05-30 DIAGNOSIS — Z79899 Other long term (current) drug therapy: Secondary | ICD-10-CM | POA: Diagnosis not present

## 2016-05-30 DIAGNOSIS — F419 Anxiety disorder, unspecified: Secondary | ICD-10-CM | POA: Insufficient documentation

## 2016-05-30 DIAGNOSIS — Z79891 Long term (current) use of opiate analgesic: Secondary | ICD-10-CM | POA: Insufficient documentation

## 2016-05-30 DIAGNOSIS — M797 Fibromyalgia: Secondary | ICD-10-CM | POA: Diagnosis not present

## 2016-05-30 DIAGNOSIS — J449 Chronic obstructive pulmonary disease, unspecified: Secondary | ICD-10-CM | POA: Diagnosis not present

## 2016-05-30 DIAGNOSIS — K449 Diaphragmatic hernia without obstruction or gangrene: Secondary | ICD-10-CM | POA: Insufficient documentation

## 2016-05-30 DIAGNOSIS — Z8719 Personal history of other diseases of the digestive system: Secondary | ICD-10-CM

## 2016-05-30 DIAGNOSIS — D649 Anemia, unspecified: Secondary | ICD-10-CM

## 2016-05-30 DIAGNOSIS — F17210 Nicotine dependence, cigarettes, uncomplicated: Secondary | ICD-10-CM | POA: Diagnosis not present

## 2016-05-30 DIAGNOSIS — K222 Esophageal obstruction: Secondary | ICD-10-CM

## 2016-05-30 DIAGNOSIS — Z8711 Personal history of peptic ulcer disease: Secondary | ICD-10-CM

## 2016-05-30 DIAGNOSIS — M1990 Unspecified osteoarthritis, unspecified site: Secondary | ICD-10-CM | POA: Insufficient documentation

## 2016-05-30 DIAGNOSIS — K219 Gastro-esophageal reflux disease without esophagitis: Secondary | ICD-10-CM | POA: Insufficient documentation

## 2016-05-30 DIAGNOSIS — R1314 Dysphagia, pharyngoesophageal phase: Secondary | ICD-10-CM | POA: Insufficient documentation

## 2016-05-30 HISTORY — PX: ESOPHAGOGASTRODUODENOSCOPY (EGD) WITH PROPOFOL: SHX5813

## 2016-05-30 HISTORY — DX: Gastric ulcer, unspecified as acute or chronic, without hemorrhage or perforation: K25.9

## 2016-05-30 HISTORY — PX: MALONEY DILATION: SHX5535

## 2016-05-30 SURGERY — ESOPHAGOGASTRODUODENOSCOPY (EGD) WITH PROPOFOL
Anesthesia: Monitor Anesthesia Care

## 2016-05-30 MED ORDER — PROPOFOL 500 MG/50ML IV EMUL
INTRAVENOUS | Status: DC | PRN
  Administered 2016-05-30: 150 ug/kg/min via INTRAVENOUS

## 2016-05-30 MED ORDER — LIDOCAINE VISCOUS 2 % MT SOLN
OROMUCOSAL | Status: AC
  Filled 2016-05-30: qty 15

## 2016-05-30 MED ORDER — PROPOFOL 10 MG/ML IV BOLUS
INTRAVENOUS | Status: AC
  Filled 2016-05-30: qty 20

## 2016-05-30 MED ORDER — PROPOFOL 10 MG/ML IV BOLUS
INTRAVENOUS | Status: DC | PRN
  Administered 2016-05-30: 20 mg via INTRAVENOUS
  Administered 2016-05-30: 6.5 mg via INTRAVENOUS

## 2016-05-30 MED ORDER — LACTATED RINGERS IV SOLN
INTRAVENOUS | Status: DC
  Administered 2016-05-30: 08:00:00 via INTRAVENOUS

## 2016-05-30 MED ORDER — CHLORHEXIDINE GLUCONATE CLOTH 2 % EX PADS: 6.0000 | MEDICATED_PAD | Freq: Once | CUTANEOUS | Status: DC

## 2016-05-30 MED ORDER — MIDAZOLAM HCL 2 MG/2ML IJ SOLN
INTRAMUSCULAR | Status: AC
  Filled 2016-05-30: qty 2

## 2016-05-30 MED ORDER — MIDAZOLAM HCL 2 MG/2ML IJ SOLN
1.0000 mg | INTRAMUSCULAR | Status: AC
  Administered 2016-05-30: 2 mg via INTRAVENOUS

## 2016-05-30 MED ORDER — LIDOCAINE VISCOUS 2 % MT SOLN
15.0000 mL | Freq: Once | OROMUCOSAL | Status: AC
  Administered 2016-05-30: 15 mL via OROMUCOSAL

## 2016-05-30 MED ORDER — FENTANYL CITRATE (PF) 100 MCG/2ML IJ SOLN
25.0000 ug | INTRAMUSCULAR | Status: AC
  Administered 2016-05-30: 25 ug via INTRAVENOUS

## 2016-05-30 MED ORDER — FENTANYL CITRATE (PF) 100 MCG/2ML IJ SOLN
INTRAMUSCULAR | Status: AC
  Filled 2016-05-30: qty 2

## 2016-05-30 NOTE — Anesthesia Postprocedure Evaluation (Signed)
Anesthesia Post Note  Patient: Brooke Deleon  Procedure(s) Performed: Procedure(s) (LRB): ESOPHAGOGASTRODUODENOSCOPY (EGD) WITH PROPOFOL (N/A) MALONEY DILATION (N/A)  Patient location during evaluation: PACU Anesthesia Type: MAC Level of consciousness: awake Pain management: satisfactory to patient Vital Signs Assessment: post-procedure vital signs reviewed and stable Respiratory status: spontaneous breathing Cardiovascular status: stable Anesthetic complications: no     Last Vitals:  Vitals:   05/30/16 0826 05/30/16 0830  BP: (!) 147/102 (!) 141/79  Pulse: 70 70  Resp: 11 12  Temp: 36.8 C     Last Pain: There were no vitals filed for this visit.               Drucie Opitz

## 2016-05-30 NOTE — Op Note (Signed)
[facility] Patient Name: Brooke Deleon Procedure Date: 05/30/2016 7:53 AM MRN: [id number] Date of Birth: March 26, 1955 Attending MD: Norvel Richards , MD CSN: [id number] Age: 61 Admit Type: Outpatient Procedure:                Upper GI endoscopy with [facility] dilation Indications:              Surveillance procedure, Dysphagia Providers:                Norvel Richards, MD, Hinton Rao, RN,                            Randa Spike, Technician Referring MD:              Medicines:                Propofol per Anesthesia Complications:            No immediate complications. Estimated Blood Loss:     Estimated blood loss was minimal. Procedure:                Pre-Anesthesia Assessment:                           - Prior to the procedure, a History and Physical                            was performed, and patient medications and                            allergies were reviewed. The patient's tolerance of                            previous anesthesia was also reviewed. The risks                            and benefits of the procedure and the sedation                            options and risks were discussed with the patient.                            All questions were answered, and informed consent                            was obtained. Prior Anticoagulants: The patient has                            taken no previous anticoagulant or antiplatelet                            agents. ASA Grade Assessment: III - A patient with                            severe systemic disease. After reviewing the risks  and benefits, the patient was deemed in                            satisfactory condition to undergo the procedure.                           After obtaining informed consent, the endoscope was                            passed under direct vision. Throughout the                            procedure, the patient's blood pressure, pulse, and                         oxygen saturations were monitored continuously. The                            EG-[id number] ([id number]) scope was introduced through the                            and advanced to the second part of duodenum. The                            upper GI endoscopy was accomplished without                            difficulty. The patient tolerated the procedure                            well. Scope In: 8:10:16 AM Scope Out: 8:19:22 AM Total Procedure Duration: 0 hours 9 minutes 6 seconds  Findings:      The examined esophagus was normal. The scope was withdrawn. Dilation was       performed with a Maloney dilator with no resistance at 61 Fr. The       dilation site was examined following endoscope reinsertion and showed       mild improvement in luminal narrowing. Estimated blood loss was minimal.       ? Cervical esophageal web dilated with passage of maloney.      A small hiatal hernia was present.      The stomach was normal. Previously noted gastric ulcer completely healed       with nice stellate-shaped scar present      The duodenal bulb and second portion of the duodenum were normal. Impression:               - Normal esophagus. Dilated. 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                           - Normal stomach. Previously noted gastric ulcer  healed. Scar present.                           - Normal duodenal bulb and second portion of the                            duodenum.                           - No specimens collected. Moderate Sedation:      Moderate (conscious) sedation was personally administered by an       anesthesia professional. The following parameters were monitored: oxygen       saturation, heart rate, blood pressure, respiratory rate, EKG, adequacy       of pulmonary ventilation, and response to care. Total physician       intraservice time was 14  minutes. Recommendation:           - Patient has a contact number available for                            emergencies. The signs and symptoms of potential                            delayed complications were discussed with the                            patient. Return to normal activities tomorrow.                            Written discharge instructions were provided to the                            patient.                           - Resume previous diet. May decrease Protonix to 40                            mg once daily.                           - Continue present medications.                           - No repeat upper endoscopy.                           - Return to GI office in 6 months. Procedure Code(s):        --- Professional ---                           [phone], Esophagogastroduodenoscopy, flexible,                            transoral; diagnostic, including collection of  specimen(s) by brushing or washing, when performed                            (separate procedure)                           43450, Dilation of esophagus, by unguided sound or                            bougie, single or multiple passes Diagnosis Code(s):        --- Professional ---                           K44.9, Diaphragmatic hernia without obstruction or                            gangrene                           R13.10, Dysphagia, unspecified CPT copyright 2016 American Medical Association. All rights reserved. The codes documented in this report are preliminary and upon coder review may  be revised to meet current compliance requirements. Cristopher Estimable. Brea Coleson, MD Norvel Richards, MD 05/30/2016 8:28:25 AM This report has been signed electronically. Number of Addenda: 0
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
## 2016-05-30 NOTE — Anesthesia Procedure Notes (Signed)
Procedure Name: MAC Date/Time: 05/30/2016 7:58 AM Performed by: Vista Deck Pre-anesthesia Checklist: Patient identified, Emergency Drugs available, Suction available, Timeout performed and Patient being monitored Patient Re-evaluated:Patient Re-evaluated prior to inductionOxygen Delivery Method: Non-rebreather mask

## 2016-05-30 NOTE — Anesthesia Preprocedure Evaluation (Addendum)
Anesthesia Evaluation  Patient identified by MRN, date of birth, ID band Patient awake    Reviewed: Allergy & Precautions, NPO status , Patient's Chart, lab work & pertinent test results  Airway Mallampati: III  TM Distance: <3 FB     Dental  (+) Edentulous Upper, Edentulous Lower   Pulmonary COPD, Current Smoker,           Cardiovascular negative cardio ROS   Rhythm:Regular Rate:Tachycardia     Neuro/Psych PSYCHIATRIC DISORDERS Anxiety Depression    GI/Hepatic PUD, GERD  ,GI bleed with anemia.   Endo/Other    Renal/GU      Musculoskeletal  (+) Fibromyalgia -  Abdominal   Peds  Hematology  (+) anemia ,   Anesthesia Other Findings   Reproductive/Obstetrics                             Anesthesia Physical Anesthesia Plan  ASA: III  Anesthesia Plan: MAC   Post-op Pain Management:    Induction: Intravenous  Airway Management Planned: Simple Face Mask  Additional Equipment:   Intra-op Plan:   Post-operative Plan:   Informed Consent: I have reviewed the patients History and Physical, chart, labs and discussed the procedure including the risks, benefits and alternatives for the proposed anesthesia with the patient or authorized representative who has indicated his/her understanding and acceptance.     Plan Discussed with:   Anesthesia Plan Comments: (Transfuse 1 PRBC in preop prior to procedure.)       Anesthesia Quick Evaluation

## 2016-05-30 NOTE — Discharge Instructions (Addendum)
EGD Discharge instructions Please read the instructions outlined below and refer to this sheet in the next few weeks. These discharge instructions provide you with general information on caring for yourself after you leave the hospital. Your doctor may also give you specific instructions. While your treatment has been planned according to the most current medical practices available, unavoidable complications occasionally occur. If you have any problems or questions after discharge, please call your doctor. ACTIVITY  You may resume your regular activity but move at a slower pace for the next 24 hours.   Take frequent rest periods for the next 24 hours.   Walking will help expel (get rid of) the air and reduce the bloated feeling in your abdomen.   No driving for 24 hours (because of the anesthesia (medicine) used during the test).   You may shower.   Do not sign any important legal documents or operate any machinery for 24 hours (because of the anesthesia used during the test).  NUTRITION  Drink plenty of fluids.   You may resume your normal diet.   Begin with a light meal and progress to your normal diet.   Avoid alcoholic beverages for 24 hours or as instructed by your caregiver.  MEDICATIONS  You may resume your normal medications unless your caregiver tells you otherwise.  WHAT YOU CAN EXPECT TODAY  You may experience abdominal discomfort such as a feeling of fullness or gas pains.  FOLLOW-UP  Your doctor will discuss the results of your test with you.  SEEK IMMEDIATE MEDICAL ATTENTION IF ANY OF THE FOLLOWING OCCUR:  Excessive nausea (feeling sick to your stomach) and/or vomiting.   Severe abdominal pain and distention (swelling).   Trouble swallowing.   Temperature over 101 F (37.8 C).   Rectal bleeding or vomiting of blood.    Previously noted gastric ulcer is completely healed  Avoid nonsteroidal agents like aspirin and Advil  Decrease Protonix to 40 mg  once daily  Office visit with [place] in 6 months

## 2016-05-30 NOTE — Transfer of Care (Signed)
Immediate Anesthesia Transfer of Care Note  Patient: Brooke Deleon  Procedure(s) Performed: Procedure(s) with comments: ESOPHAGOGASTRODUODENOSCOPY (EGD) WITH PROPOFOL (N/A) - 8:15am MALONEY DILATION (N/A)  Patient Location: PACU  Anesthesia Type:MAC  Level of Consciousness: awake and alert   Airway & Oxygen Therapy: Patient Spontanous Breathing  Post-op Assessment: Report given to RN and Post -op Vital signs reviewed and stable  Post vital signs: Reviewed and stable  Last Vitals:  Vitals:   05/30/16 0750 05/30/16 0755  BP: 140/89 138/89  Resp: (!) 36 (!) 30    Last Pain: There were no vitals filed for this visit.       Complications: No apparent anesthesia complications

## 2016-06-02 ENCOUNTER — Encounter (HOSPITAL_COMMUNITY): Payer: Self-pay | Admitting: Internal Medicine

## 2016-06-03 ENCOUNTER — Ambulatory Visit (HOSPITAL_COMMUNITY)
Admission: RE | Admit: 2016-06-03 | Discharge: 2016-06-03 | Disposition: A | Discharge status: Home / Self Care | Payer: PPO | Source: Ambulatory Visit | Attending: Orthopaedic Surgery | Admitting: Orthopaedic Surgery

## 2016-06-03 DIAGNOSIS — S83241A Other tear of medial meniscus, current injury, right knee, initial encounter: Secondary | ICD-10-CM | POA: Diagnosis not present

## 2016-06-03 DIAGNOSIS — M1711 Unilateral primary osteoarthritis, right knee: Secondary | ICD-10-CM | POA: Insufficient documentation

## 2016-06-03 DIAGNOSIS — X58XXXA Exposure to other specified factors, initial encounter: Secondary | ICD-10-CM | POA: Insufficient documentation

## 2016-06-03 DIAGNOSIS — G8929 Other chronic pain: Secondary | ICD-10-CM | POA: Insufficient documentation

## 2016-06-03 DIAGNOSIS — M25561 Pain in right knee: Secondary | ICD-10-CM | POA: Diagnosis not present

## 2016-06-09 DIAGNOSIS — Z1231 Encounter for screening mammogram for malignant neoplasm of breast: Secondary | ICD-10-CM | POA: Diagnosis not present

## 2016-06-13 DIAGNOSIS — J449 Chronic obstructive pulmonary disease, unspecified: Secondary | ICD-10-CM | POA: Diagnosis not present

## 2016-06-13 DIAGNOSIS — F329 Major depressive disorder, single episode, unspecified: Secondary | ICD-10-CM | POA: Diagnosis not present

## 2016-06-13 DIAGNOSIS — I10 Essential (primary) hypertension: Secondary | ICD-10-CM | POA: Diagnosis not present

## 2016-06-13 DIAGNOSIS — G894 Chronic pain syndrome: Secondary | ICD-10-CM | POA: Diagnosis not present

## 2016-06-13 DIAGNOSIS — R2681 Unsteadiness on feet: Secondary | ICD-10-CM | POA: Diagnosis not present

## 2016-06-13 DIAGNOSIS — Q059 Spina bifida, unspecified: Secondary | ICD-10-CM | POA: Diagnosis not present

## 2016-06-13 DIAGNOSIS — Z79899 Other long term (current) drug therapy: Secondary | ICD-10-CM | POA: Diagnosis not present

## 2016-06-13 DIAGNOSIS — M545 Low back pain: Secondary | ICD-10-CM | POA: Diagnosis not present

## 2016-06-13 DIAGNOSIS — N393 Stress incontinence (female) (male): Secondary | ICD-10-CM | POA: Diagnosis not present

## 2016-06-13 DIAGNOSIS — F17200 Nicotine dependence, unspecified, uncomplicated: Secondary | ICD-10-CM | POA: Diagnosis not present

## 2016-06-13 DIAGNOSIS — M25519 Pain in unspecified shoulder: Secondary | ICD-10-CM | POA: Diagnosis not present

## 2016-06-13 DIAGNOSIS — M797 Fibromyalgia: Secondary | ICD-10-CM | POA: Diagnosis not present

## 2016-06-14 ENCOUNTER — Ambulatory Visit (INDEPENDENT_AMBULATORY_CARE_PROVIDER_SITE_OTHER): Payer: PPO | Admitting: Orthopaedic Surgery

## 2016-06-14 ENCOUNTER — Encounter: Payer: Self-pay | Admitting: Orthopaedic Surgery

## 2016-06-14 VITALS — BP 120/88 | HR 73 | Ht 60.0 in | Wt 141.0 lb

## 2016-06-14 DIAGNOSIS — F17210 Nicotine dependence, cigarettes, uncomplicated: Secondary | ICD-10-CM | POA: Diagnosis not present

## 2016-06-14 DIAGNOSIS — G8929 Other chronic pain: Secondary | ICD-10-CM | POA: Diagnosis not present

## 2016-06-14 DIAGNOSIS — M25561 Pain in right knee: Secondary | ICD-10-CM
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
## 2016-06-29 ENCOUNTER — Ambulatory Visit (INDEPENDENT_AMBULATORY_CARE_PROVIDER_SITE_OTHER): Payer: PPO | Admitting: Orthopedic Surgery

## 2016-06-29 ENCOUNTER — Encounter: Payer: Self-pay | Admitting: Orthopedic Surgery

## 2016-06-29 VITALS — BP 109/77 | HR 73 | Ht 61.0 in | Wt 141.0 lb

## 2016-06-29 DIAGNOSIS — M1711 Unilateral primary osteoarthritis, right knee: Secondary | ICD-10-CM

## 2016-06-29 DIAGNOSIS — M23321 Other meniscus derangements, posterior horn of medial meniscus, right knee: Secondary | ICD-10-CM

## 2016-06-29 DIAGNOSIS — S83281D Other tear of lateral meniscus, current injury, right knee, subsequent encounter: Secondary | ICD-10-CM

## 2016-06-29 NOTE — Patient Instructions (Signed)
You have decided to proceed with operative arthroscopy of the knee. You have decided not to continue with nonoperative measures such as but not limited to oral medication, weight loss, activity modification, physical therapy, bracing, or injection.  We will perform operative arthroscopy of the knee. Some of the risks associated with arthroscopic surgery of the knee include but are not limited to Bleeding Infection Swelling Stiffness Blood clot Pain  If you're not comfortable with these risks and would like to continue with nonoperative treatment please let Dr. Makahla Kiser know prior to your surgery. 
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
## 2016-07-06 ENCOUNTER — Other Ambulatory Visit: Payer: Self-pay

## 2016-07-11 ENCOUNTER — Telehealth: Payer: Self-pay | Admitting: Orthopedic Surgery

## 2016-07-11 NOTE — Telephone Encounter (Signed)
Patient states her prescription for walker is to be faxed to [facility], with diagnosis codes included.  Patient phone if quesitons: # [phone]

## 2016-07-12 ENCOUNTER — Encounter (HOSPITAL_COMMUNITY): Payer: Self-pay

## 2016-07-12 ENCOUNTER — Other Ambulatory Visit: Payer: Self-pay | Admitting: *Deleted

## 2016-07-12 ENCOUNTER — Encounter (HOSPITAL_COMMUNITY)
Admission: RE | Admit: 2016-07-12 | Discharge: 2016-07-12 | Disposition: A | Discharge status: Home / Self Care | Payer: PPO | Source: Ambulatory Visit | Attending: Orthopedic Surgery | Admitting: Orthopedic Surgery

## 2016-07-12 DIAGNOSIS — S8990XD Unspecified injury of unspecified lower leg, subsequent encounter: Secondary | ICD-10-CM

## 2016-07-12 NOTE — Patient Instructions (Signed)
Brooke Deleon  07/12/2016     @PREFPERIOPPHARMACY @   Your procedure is scheduled on 07/14/2016.  Report to [name] at 9:30 A.M.  Call this number if you have problems the morning of surgery:  [phone]   Remember:  Do not eat food or drink liquids after midnight.  Take these medicines the morning of surgery with A SIP OF WATER xanax, celexa, cymbalta, neurontin, robaxin OR zanaflex, metoprolol, oxycodone  OR ultram if needed, Protonix   Do not wear jewelry, make-up or nail polish.  Do not wear lotions, powders, or perfumes, or deoderant.  Do not shave 48 hours prior to surgery.  Men may shave face and neck.  Do not bring valuables to the hospital.  [name] is not responsible for any belongings or valuables.  Contacts, dentures or bridgework may not be worn into surgery.  Leave your suitcase in the car.  After surgery it may be brought to your room.  For patients admitted to the hospital, discharge time will be determined by your treatment team.  Patients discharged the day of surgery will not be allowed to drive home.    Please read over the following fact sheets that you were given. Surgical Site Infection Prevention and Anesthesia Post-op Instructions     PATIENT INSTRUCTIONS POST-ANESTHESIA  IMMEDIATELY FOLLOWING SURGERY:  Do not drive or operate machinery for the first twenty four hours after surgery.  Do not make any important decisions for twenty four hours after surgery or while taking narcotic pain medications or sedatives.  If you develop intractable nausea and vomiting or a severe headache please notify your doctor immediately.  FOLLOW-UP:  Please make an appointment with your surgeon as instructed. You do not need to follow up with anesthesia unless specifically instructed to do so.  WOUND CARE INSTRUCTIONS (if applicable):  Keep a dry clean dressing on the anesthesia/puncture wound site if there is drainage.  Once the wound has quit draining you may  leave it open to air.  Generally you should leave the bandage intact for twenty four hours unless there is drainage.  If the epidural site drains for more than 36-48 hours please call the anesthesia department.  QUESTIONS?:  Please feel free to call your physician or the hospital operator if you have any questions, and they will be happy to assist you.      Knee Arthroscopy Knee arthroscopy is a surgical procedure that is used to examine the inside of your knee joint and repair any damage. The surgeon puts a small, lighted instrument with a camera on the tip (arthroscope) through a small incision in your knee. The camera sends pictures to a monitor in the operating room. Your surgeon uses those pictures to guide the surgical instruments through other incisions to the area of damage. Knee arthroscopy can be used to treat many types of knee problems. It may be used:  To repair a torn ligament.  To repair or remove damaged tissue.  To remove a fluid-filled sac (cyst) from your knee.  Tell a health care provider about:  Any allergies you have.  All medicines you are taking, including vitamins, herbs, eye drops, creams, and over-the-counter medicines.  Any problems you or family members have had with anesthetic medicines.  Any blood disorders you have.  Any surgeries you have had.  Any medical conditions you have. What are the risks? Generally, this is a safe procedure. However, problems may occur, including:  Infection.  Bleeding.  Damage to  blood vessels, nerves, or structures of your knee.  A blood clot that forms in your leg and travels to your lung.  Failure to relieve symptoms.  What happens before the procedure?  Ask your health care provider about: ? Changing or stopping your regular medicines. This is especially important if you are taking diabetes medicines or blood thinners. ? Taking medicines such as aspirin and ibuprofen. These medicines can thin your blood. Do  not take these medicines before your procedure if your health care provider instructs you not to.  Follow your health care provider's instructions about eating or drinking restrictions.  Plan to have someone take you home after the procedure.  If you go home right after the procedure, plan to have someone with you for 24 hours.  Do not drink alcohol unless your health care provider says that you can.  Do not use any tobacco products, including cigarettes, chewing tobacco, or electronic cigarettes unless your health care provider says that you can. If you need help quitting, ask your health care provider.  You may have a physical exam. What happens during the procedure?  An IV tube will be inserted into one of your veins.  You will be given one or more of the following: ? A medicine that helps you relax (sedative). ? A medicine that numbs the area (local anesthetic). ? A medicine that makes you fall asleep (general anesthetic). ? A medicine that is injected into your spine that numbs the area below and slightly above the injection site (spinal anesthetic). ? A medicine that is injected into an area of your body that numbs everything below the injection site (regional anesthetic).  A cuff may be placed around your upper leg to slow bleeding during the procedure.  The surgeon will make a small number of incisions around your knee.  Your knee joint will be flushed and filled with a germ-free (sterile) solution.  The arthroscope will be passed through an incision into your knee joint.  More instruments will be passed through other incisions to repair your knee as needed.  The fluid will be removed from your knee.  The incisions will be closed with adhesive strips or stitches (sutures).  A bandage (dressing) will be placed over your knee. The procedure may vary among health care providers and hospitals. What happens after the procedure?  Your blood pressure, heart rate, breathing  rate and blood oxygen level will be monitored often until the medicines you were given have worn off.  You may be given medicine for pain.  You may get crutches to help you walk without using your knee to support your body weight.  You may have to wear compression stockings. These stocking help to prevent blood clots and reduce swelling in your legs. This information is not intended to replace advice given to you by your health care provider. Make sure you discuss any questions you have with your health care provider. Document Released: 01/22/2000 Document Revised: 07/02/2015 Document Reviewed: 01/20/2014 Elsevier Interactive Patient Education  2017 [employer].

## 2016-07-12 NOTE — Telephone Encounter (Signed)
DONE

## 2016-07-13 NOTE — H&P (Signed)
Preop history and physical for outpatient surgery         Chief Complaint  Patient presents with  . Knee Problem      right knee oa and meniscus tear      This is a 61 year old female presents for evaluation for possible right knee surgery. She fell at [facility] landed on her right knee twisted it developed painful swelling loss of motion catching locking. After thorough workup it was found that she had a torn medial and lateral meniscus and osteoarthritis of her right knee. She has opted not to continue with nonsurgical management and prefers operative treatment to improve her knee function     Review of Systems  Constitutional: Negative for chills and fever.  Respiratory: Negative for wheezing.   Cardiovascular: Negative for chest pain.  Skin: Negative for itching and rash.            Past Medical History:  Diagnosis Date  . Anemia    . Anxiety    . Arthritis    . COPD (chronic obstructive pulmonary disease) ([place])    . Depression    . Fibromyalgia    . Gastric ulcer    . GERD (gastroesophageal reflux disease)    . Neuropathy    . Stress incontinence    . Tachycardia             Past Surgical History:  Procedure Laterality Date  . BIOPSY   03/01/2016    Procedure: BIOPSY;  Surgeon: Danie Binder, MD;  Location: [facility];  Service: Endoscopy;;  gastric bx's  . COLONOSCOPY        Dr. Doristine Mango: normal. next TCS 10 years  . [facility]   [facility]  [facility] ESOPHAGOGASTRODUODENOSCOPY (EGD) WITH PROPOFOL N/A 03/01/2016    Dr. Oneida Alar: Upper GI bleed secondary to large gastric ulcer. Biopsies benign, no H pylori  . ESOPHAGOGASTRODUODENOSCOPY (EGD) WITH PROPOFOL N/A 05/30/2016    Procedure: ESOPHAGOGASTRODUODENOSCOPY (EGD) WITH PROPOFOL;  Surgeon: Daneil Dolin, MD;  Location: [facility];  Service: Endoscopy;  Laterality: N/A;  8:15am  . INCONTINENCE SURGERY      . MALONEY DILATION N/A 05/30/2016    Procedure: Venia Minks  DILATION;  Surgeon: Daneil Dolin, MD;  Location: [facility];  Service: Endoscopy;  Laterality: N/A;  . SPLENECTOMY, PARTIAL        [facility] due [facility]  . TONSILLECTOMY        child  . TUBAL LIGATION               Family History  Problem Relation Age of Onset  . Anesthesia problems Neg Hx    . Hypotension Neg Hx    . Malignant hyperthermia Neg Hx    . Pseudochol deficiency Neg Hx    . Colon cancer Neg Hx           Social History  Substance Use Topics  . Smoking status: Current Every Day Smoker      Packs/day: 1.00      Years: 40.00      Types: Cigarettes  . Smokeless tobacco: Never Used  . Alcohol use No      BP 109/77   Pulse 73   Ht 5\' 1"  (1.549 m)   Wt 141 lb (64 kg)   BMI 26.64 kg/m    Physical Exam  Constitutional: She is oriented to person, place, and time. She appears well-developed  and well-nourished.  Neurological: She is alert and oriented to person, place, and time.  Psychiatric: She has a normal mood and affect.  Vitals reviewed.     Ortho Exam    I did not try significant problems with her gait. Her left knee looks good alignment is normal range of motion is full stability and strength tests were normal as well. She had no skin rashes she had good pulse temperature no edema normal sensation was noted as well in her balance was good.   On the right side we do find not tender medial and lateral compartment primarily medial with positive McMurray sign for the medial meniscal tear. Muscle strength and tone are normal ligament ligaments are stable pulse and temperature was normal without edema or swelling sensation was normal.      Meds ordered this encounter  Medications  . tiZANidine (ZANAFLEX) 2 MG tablet  . traMADol (ULTRAM) 50 MG tablet          Encounter Diagnoses  Name Primary?  . Derangement of posterior horn of medial meniscus of right knee Yes  . Other tear of lateral meniscus of right knee as current  injury, subsequent encounter    . Primary osteoarthritis of right knee       IMPRESSION: Horizontal tear posterior horn medial meniscus.   The body of the lateral meniscus is markedly degenerated and extruded peripherally out of the joint. Horizontal tear throughout the body reaches the meniscal undersurface.   Osteoarthritis about the knee appearing worst in the lateral compartment     Electronically Signed   By: Inge Rise M.D.   On: 06/03/2016 15:23    PLAN:  We recommend arthroscopy of the right knee and partial medial and lateral meniscectomy

## 2016-07-14 ENCOUNTER — Encounter (HOSPITAL_COMMUNITY)
Admission: RE | Disposition: A | Discharge status: Home / Self Care | Payer: Self-pay | Source: Ambulatory Visit | Attending: Orthopedic Surgery

## 2016-07-14 ENCOUNTER — Ambulatory Visit (HOSPITAL_COMMUNITY)
Admission: RE | Admit: 2016-07-14 | Discharge: 2016-07-14 | Disposition: A | Discharge status: Home / Self Care | Payer: PPO | Source: Ambulatory Visit | Attending: Orthopedic Surgery | Admitting: Orthopedic Surgery

## 2016-07-14 ENCOUNTER — Encounter (HOSPITAL_COMMUNITY): Payer: Self-pay | Admitting: Anesthesiology

## 2016-07-14 ENCOUNTER — Ambulatory Visit (HOSPITAL_COMMUNITY): Payer: PPO | Admitting: Anesthesiology

## 2016-07-14 DIAGNOSIS — S83281A Other tear of lateral meniscus, current injury, right knee, initial encounter: Secondary | ICD-10-CM | POA: Diagnosis not present

## 2016-07-14 DIAGNOSIS — Y9289 Other specified places as the place of occurrence of the external cause: Secondary | ICD-10-CM | POA: Diagnosis not present

## 2016-07-14 DIAGNOSIS — J449 Chronic obstructive pulmonary disease, unspecified: Secondary | ICD-10-CM | POA: Insufficient documentation

## 2016-07-14 DIAGNOSIS — F419 Anxiety disorder, unspecified: Secondary | ICD-10-CM | POA: Diagnosis not present

## 2016-07-14 DIAGNOSIS — D649 Anemia, unspecified: Secondary | ICD-10-CM | POA: Insufficient documentation

## 2016-07-14 DIAGNOSIS — R000 Tachycardia, unspecified: Secondary | ICD-10-CM | POA: Diagnosis not present

## 2016-07-14 DIAGNOSIS — M1711 Unilateral primary osteoarthritis, right knee: Secondary | ICD-10-CM | POA: Insufficient documentation

## 2016-07-14 DIAGNOSIS — M797 Fibromyalgia: Secondary | ICD-10-CM | POA: Diagnosis not present

## 2016-07-14 DIAGNOSIS — Y939 Activity, unspecified: Secondary | ICD-10-CM | POA: Insufficient documentation

## 2016-07-14 DIAGNOSIS — N393 Stress incontinence (female) (male): Secondary | ICD-10-CM | POA: Insufficient documentation

## 2016-07-14 DIAGNOSIS — M1990 Unspecified osteoarthritis, unspecified site: Secondary | ICD-10-CM | POA: Diagnosis not present

## 2016-07-14 DIAGNOSIS — G629 Polyneuropathy, unspecified: Secondary | ICD-10-CM | POA: Insufficient documentation

## 2016-07-14 DIAGNOSIS — Z8711 Personal history of peptic ulcer disease: Secondary | ICD-10-CM | POA: Diagnosis not present

## 2016-07-14 DIAGNOSIS — S83241A Other tear of medial meniscus, current injury, right knee, initial encounter: Secondary | ICD-10-CM | POA: Diagnosis not present

## 2016-07-14 DIAGNOSIS — F17210 Nicotine dependence, cigarettes, uncomplicated: Secondary | ICD-10-CM | POA: Diagnosis not present

## 2016-07-14 DIAGNOSIS — K219 Gastro-esophageal reflux disease without esophagitis: Secondary | ICD-10-CM | POA: Diagnosis not present

## 2016-07-14 DIAGNOSIS — W1830XA Fall on same level, unspecified, initial encounter: Secondary | ICD-10-CM | POA: Insufficient documentation

## 2016-07-14 DIAGNOSIS — F329 Major depressive disorder, single episode, unspecified: Secondary | ICD-10-CM | POA: Diagnosis not present

## 2016-07-14 DIAGNOSIS — M23300 Other meniscus derangements, unspecified lateral meniscus, right knee: Secondary | ICD-10-CM | POA: Diagnosis not present

## 2016-07-14 DIAGNOSIS — Z9889 Other specified postprocedural states: Secondary | ICD-10-CM

## 2016-07-14 HISTORY — PX: KNEE ARTHROSCOPY WITH MEDIAL MENISECTOMY: SHX5651

## 2016-07-14 SURGERY — ARTHROSCOPY, KNEE, WITH MEDIAL MENISCECTOMY
Anesthesia: General | Site: Knee | Laterality: Right

## 2016-07-14 MED ORDER — PROPOFOL 10 MG/ML IV BOLUS
INTRAVENOUS | Status: DC | PRN
  Administered 2016-07-14: 190 mg via INTRAVENOUS

## 2016-07-14 MED ORDER — CEFAZOLIN SODIUM-DEXTROSE 2-4 GM/100ML-% IV SOLN
2.0000 g | INTRAVENOUS | Status: AC
Start: 2016-07-14 — End: 2016-07-14
  Administered 2016-07-14: 2 g via INTRAVENOUS
  Filled 2016-07-14: qty 100

## 2016-07-14 MED ORDER — FENTANYL CITRATE (PF) 250 MCG/5ML IJ SOLN
INTRAMUSCULAR | Status: AC
  Filled 2016-07-14: qty 5

## 2016-07-14 MED ORDER — EPINEPHRINE PF 1 MG/ML IJ SOLN
INTRAMUSCULAR | Status: AC
  Filled 2016-07-14: qty 3

## 2016-07-14 MED ORDER — LIDOCAINE HCL (PF) 1 % IJ SOLN
INTRAMUSCULAR | Status: AC
  Filled 2016-07-14: qty 5

## 2016-07-14 MED ORDER — OXYCODONE-ACETAMINOPHEN 10-325 MG PO TABS: 1.0000 | ORAL_TABLET | ORAL | 0 refills | Status: DC | PRN

## 2016-07-14 MED ORDER — LIDOCAINE HCL (CARDIAC) 10 MG/ML IV SOLN
INTRAVENOUS | Status: DC | PRN
  Administered 2016-07-14: 40 mg via INTRAVENOUS

## 2016-07-14 MED ORDER — ONDANSETRON HCL 4 MG/2ML IJ SOLN
INTRAMUSCULAR | Status: AC
  Filled 2016-07-14: qty 2

## 2016-07-14 MED ORDER — KETOROLAC TROMETHAMINE 30 MG/ML IJ SOLN
30.0000 mg | Freq: Once | INTRAMUSCULAR | Status: AC
Start: 2016-07-14 — End: 2016-07-14
  Administered 2016-07-14: 30 mg via INTRAVENOUS
  Filled 2016-07-14: qty 1

## 2016-07-14 MED ORDER — ONDANSETRON HCL 4 MG/2ML IJ SOLN
4.0000 mg | Freq: Once | INTRAMUSCULAR | Status: AC
  Administered 2016-07-14: 4 mg via INTRAVENOUS
  Filled 2016-07-14: qty 2

## 2016-07-14 MED ORDER — IPRATROPIUM-ALBUTEROL 0.5-2.5 (3) MG/3ML IN SOLN
3.0000 mL | Freq: Once | RESPIRATORY_TRACT | Status: AC
  Administered 2016-07-14: 3 mL via RESPIRATORY_TRACT

## 2016-07-14 MED ORDER — SODIUM CHLORIDE 0.9 % IJ SOLN
INTRAMUSCULAR | Status: AC
  Filled 2016-07-14: qty 10

## 2016-07-14 MED ORDER — BUPIVACAINE-EPINEPHRINE (PF) 0.5% -1:200000 IJ SOLN
INTRAMUSCULAR | Status: AC
  Filled 2016-07-14: qty 60

## 2016-07-14 MED ORDER — ONDANSETRON HCL 4 MG/2ML IJ SOLN
4.0000 mg | Freq: Once | INTRAMUSCULAR | Status: AC
  Administered 2016-07-14: 4 mg via INTRAVENOUS

## 2016-07-14 MED ORDER — FENTANYL CITRATE (PF) 100 MCG/2ML IJ SOLN
25.0000 ug | INTRAMUSCULAR | Status: DC | PRN
  Administered 2016-07-14: 50 ug via INTRAVENOUS
  Filled 2016-07-14: qty 2

## 2016-07-14 MED ORDER — BUPIVACAINE-EPINEPHRINE (PF) 0.5% -1:200000 IJ SOLN
INTRAMUSCULAR | Status: DC | PRN
  Administered 2016-07-14: 60 mL

## 2016-07-14 MED ORDER — IPRATROPIUM-ALBUTEROL 0.5-2.5 (3) MG/3ML IN SOLN
RESPIRATORY_TRACT | Status: AC
  Filled 2016-07-14: qty 3

## 2016-07-14 MED ORDER — EPINEPHRINE PF 1 MG/ML IJ SOLN
INTRAMUSCULAR | Status: AC
  Filled 2016-07-14: qty 2

## 2016-07-14 MED ORDER — MIDAZOLAM HCL 2 MG/2ML IJ SOLN
INTRAMUSCULAR | Status: AC
  Filled 2016-07-14: qty 2

## 2016-07-14 MED ORDER — SODIUM CHLORIDE 0.9 % IR SOLN
Status: DC | PRN
  Administered 2016-07-14 (×4): 3000 mL

## 2016-07-14 MED ORDER — LACTATED RINGERS IV SOLN
INTRAVENOUS | Status: DC
  Administered 2016-07-14 (×2): via INTRAVENOUS

## 2016-07-14 MED ORDER — FENTANYL CITRATE (PF) 100 MCG/2ML IJ SOLN
INTRAMUSCULAR | Status: DC | PRN
  Administered 2016-07-14: 25 ug via INTRAVENOUS
  Administered 2016-07-14: 50 ug via INTRAVENOUS
  Administered 2016-07-14: 25 ug via INTRAVENOUS

## 2016-07-14 MED ORDER — EPHEDRINE SULFATE 50 MG/ML IJ SOLN
INTRAMUSCULAR | Status: AC
Start: 2016-07-14 — End: 2016-07-14
  Filled 2016-07-14: qty 1

## 2016-07-14 MED ORDER — HYDROCODONE-ACETAMINOPHEN 7.5-325 MG PO TABS
1.0000 | ORAL_TABLET | Freq: Once | ORAL | Status: AC
  Administered 2016-07-14: 1 via ORAL
  Filled 2016-07-14: qty 1

## 2016-07-14 MED ORDER — MIDAZOLAM HCL 2 MG/2ML IJ SOLN
1.0000 mg | INTRAMUSCULAR | Status: AC
  Administered 2016-07-14: 2 mg via INTRAVENOUS

## 2016-07-14 MED ORDER — CHLORHEXIDINE GLUCONATE 4 % EX LIQD: 60.0000 mL | Freq: Once | CUTANEOUS | Status: DC

## 2016-07-14 MED ORDER — PANTOPRAZOLE SODIUM 40 MG PO TBEC: 40.0000 mg | DELAYED_RELEASE_TABLET | Freq: Two times a day (BID) | ORAL | 2 refills | Status: DC

## 2016-07-14 SURGICAL SUPPLY — 51 items
BAG HAMPER (MISCELLANEOUS) ×3 IMPLANT
BANDAGE ELASTIC 6 LF NS (GAUZE/BANDAGES/DRESSINGS) ×3 IMPLANT
BLADE AGGRESSIVE PLUS 4.0 (BLADE) ×3 IMPLANT
BLADE SURG SZ11 CARB STEEL (BLADE) ×3 IMPLANT
BNDG CMPR MED 5X6 ELC HKLP NS (GAUZE/BANDAGES/DRESSINGS) ×1
BUR 5.0 BARRELL (BURR) ×2 IMPLANT
CHLORAPREP W/TINT 26ML (MISCELLANEOUS) ×3 IMPLANT
CLOTH BEACON ORANGE TIMEOUT ST (SAFETY) ×3 IMPLANT
COOLER CRYO IC GRAV AND TUBE (ORTHOPEDIC SUPPLIES) ×3 IMPLANT
CUFF CRYO KNEE18X23 MED (MISCELLANEOUS) ×2 IMPLANT
CUFF TOURNIQUET SINGLE 24IN (TOURNIQUET CUFF) ×2 IMPLANT
DECANTER SPIKE VIAL GLASS SM (MISCELLANEOUS) ×6 IMPLANT
GAUZE SPONGE 4X4 12PLY STRL (GAUZE/BANDAGES/DRESSINGS) ×3 IMPLANT
GAUZE SPONGE 4X4 16PLY XRAY LF (GAUZE/BANDAGES/DRESSINGS) ×3 IMPLANT
GAUZE XEROFORM 5X9 LF (GAUZE/BANDAGES/DRESSINGS) ×3 IMPLANT
GLOVE BIO SURGEON STRL SZ7 (GLOVE) ×2 IMPLANT
GLOVE BIOGEL PI IND STRL 7.0 (GLOVE) ×1 IMPLANT
GLOVE BIOGEL PI INDICATOR 7.0 (GLOVE) ×2
GLOVE SKINSENSE NS SZ8.0 LF (GLOVE) ×2
GLOVE SKINSENSE STRL SZ8.0 LF (GLOVE) ×1 IMPLANT
GLOVE SS N UNI LF 8.5 STRL (GLOVE) ×3 IMPLANT
GOWN STRL REUS W/ TWL LRG LVL3 (GOWN DISPOSABLE) ×1 IMPLANT
GOWN STRL REUS W/TWL LRG LVL3 (GOWN DISPOSABLE) ×3
GOWN STRL REUS W/TWL XL LVL3 (GOWN DISPOSABLE) ×3 IMPLANT
HLDR LEG FOAM (MISCELLANEOUS) ×1 IMPLANT
IV NS IRRIG 3000ML ARTHROMATIC (IV SOLUTION) ×10 IMPLANT
KIT BLADEGUARD II DBL (SET/KITS/TRAYS/PACK) ×3 IMPLANT
KIT ROOM TURNOVER AP CYSTO (KITS) ×3 IMPLANT
LEG HOLDER FOAM (MISCELLANEOUS) ×2
MANIFOLD NEPTUNE II (INSTRUMENTS) ×3 IMPLANT
MARKER SKIN DUAL TIP RULER LAB (MISCELLANEOUS) ×3 IMPLANT
NDL HYPO 18GX1.5 BLUNT FILL (NEEDLE) ×1 IMPLANT
NDL HYPO 21X1.5 SAFETY (NEEDLE) ×1 IMPLANT
NDL SPNL 18GX3.5 QUINCKE PK (NEEDLE) ×1 IMPLANT
NEEDLE HYPO 18GX1.5 BLUNT FILL (NEEDLE) ×3 IMPLANT
NEEDLE HYPO 21X1.5 SAFETY (NEEDLE) ×3 IMPLANT
NEEDLE SPNL 18GX3.5 QUINCKE PK (NEEDLE) ×3 IMPLANT
NS IRRIG 1000ML POUR BTL (IV SOLUTION) ×3 IMPLANT
PACK ARTHRO LIMB DRAPE STRL (MISCELLANEOUS) ×3 IMPLANT
PAD ABD 5X9 TENDERSORB (GAUZE/BANDAGES/DRESSINGS) ×3 IMPLANT
PAD ARMBOARD 7.5X6 YLW CONV (MISCELLANEOUS) ×3 IMPLANT
PADDING CAST COTTON 6X4 STRL (CAST SUPPLIES) ×3 IMPLANT
PADDING WEBRIL 6 STERILE (GAUZE/BANDAGES/DRESSINGS) ×2 IMPLANT
SET ARTHROSCOPY INST (INSTRUMENTS) ×3 IMPLANT
SET ARTHROSCOPY PUMP TUBE (IRRIGATION / IRRIGATOR) ×3 IMPLANT
SET BASIN LINEN APH (SET/KITS/TRAYS/PACK) ×3 IMPLANT
SUT ETHILON 3 0 FSL (SUTURE) ×3 IMPLANT
SYR 30ML LL (SYRINGE) ×3 IMPLANT
SYRINGE 10CC LL (SYRINGE) ×3 IMPLANT
TUBE CONNECTING 12'X1/4 (SUCTIONS) ×3
TUBE CONNECTING 12X1/4 (SUCTIONS) ×6 IMPLANT

## 2016-07-14 NOTE — Brief Op Note (Addendum)
07/14/2016  12:02 PM  PATIENT:  Brooke Deleon  61 y.o. female  PRE-OPERATIVE DIAGNOSIS:  RIGHT KNEE LATERAL AND MEDIAL MENISCUS TEARS  POST-OPERATIVE DIAGNOSIS:  arthritis and torn lateral meniscus right knee  PROCEDURE:  Procedure(s): KNEE ARTHROSCOPY WITH  LATERAL MENISECTOMY (Right)  SURGEON:  Surgeon(s) and Role:    Carole Civil, MD - Primary  PHYSICIAN ASSISTANT:   ASSISTANTS: none   ANESTHESIA:   general  EBL:  Total I/O In: 900 [I.V.:900] Out: 0   BLOOD ADMINISTERED:none  DRAINS: none   LOCAL MEDICATIONS USED:  MARCAINE     SPECIMEN:  No Specimen  DISPOSITION OF SPECIMEN:  N/A  COUNTS:  YES  TOURNIQUET:    DICTATION: .Dragon Dictation  PLAN OF CARE: Discharge to home after PACU  PATIENT DISPOSITION:  PACU - hemodynamically stable.   Delay start of Pharmacological VTE agent (>24hrs) due to surgical blood loss or risk of bleeding: not applicable  [id number]

## 2016-07-14 NOTE — Addendum Note (Signed)
Addendum  created 07/14/16 0907 by Ollen Bowl, CRNA   Anesthesia Staff edited

## 2016-07-14 NOTE — Anesthesia Postprocedure Evaluation (Signed)
Anesthesia Post Note  Patient: Brooke Deleon  Procedure(s) Performed: Procedure(s) (LRB): KNEE ARTHROSCOPY WITH  LATERAL MENISECTOMY (Right)  Patient location during evaluation: PACU Anesthesia Type: General Level of consciousness: awake and alert and oriented Pain management: pain level controlled Vital Signs Assessment: post-procedure vital signs reviewed and stable Respiratory status: spontaneous breathing Cardiovascular status: blood pressure returned to baseline Postop Assessment: no signs of nausea or vomiting and adequate PO intake Anesthetic complications: no     Last Vitals:  Vitals:   07/14/16 1230 07/14/16 1232  BP: (!) 141/60 (!) 141/60  Pulse: 80 80  Resp: 17 (!) 8  Temp:      Last Pain:  Vitals:   07/14/16 1232  PainSc: 9                  Lamontae Ricardo

## 2016-07-14 NOTE — Anesthesia Procedure Notes (Signed)
Procedure Name: LMA Insertion Date/Time: 07/14/2016 11:04 AM Performed by: Tressie Stalker E Pre-anesthesia Checklist: Patient identified, Patient being monitored, Emergency Drugs available, Timeout performed and Suction available Patient Re-evaluated:Patient Re-evaluated prior to inductionOxygen Delivery Method: Circle System Utilized Preoxygenation: Pre-oxygenation with 100% oxygen Intubation Type: IV induction Ventilation: Mask ventilation without difficulty LMA: LMA inserted LMA Size: 4.0 Number of attempts: 1 Placement Confirmation: positive ETCO2 and breath sounds checked- equal and bilateral

## 2016-07-14 NOTE — Interval H&P Note (Signed)
History and Physical Interval Note:  07/14/2016 10:51 AM  Brooke Deleon  has presented today for surgery, with the diagnosis of RIGHT KNEE LATERAL AND MEDIAL MENISCUS TEARS  The various methods of treatment have been discussed with the patient and family. After consideration of risks, benefits and other options for treatment, the patient has consented to  Procedure(s): KNEE ARTHROSCOPY WITH MEDIAL MENISECTOMY AND LATERAL MENISECTOMY (Right) as a surgical intervention .  The patient's history has been reviewed, patient examined, no change in status, stable for surgery.  I have reviewed the patient's chart and labs.  Questions were answered to the patient's satisfaction.     Arther Abbott

## 2016-07-14 NOTE — Op Note (Signed)
07/14/2016  12:02 PM  PATIENT:  Brooke Deleon  61 y.o. female  PRE-OPERATIVE DIAGNOSIS:  RIGHT KNEE LATERAL AND MEDIAL MENISCUS TEARS  POST-OPERATIVE DIAGNOSIS:  arthritis and torn lateral meniscus right knee  PROCEDURE:  Procedure(s): KNEE ARTHROSCOPY WITH  LATERAL MENISECTOMY (Right)  SURGEON:  Surgeon(s) and Role:    Carole Civil, MD - Primary  PHYSICIAN ASSISTANT:   ASSISTANTS: none   ANESTHESIA:   general  EBL:  Total I/O In: 900 [I.V.:900] Out: 0   BLOOD ADMINISTERED:none  DRAINS: none   LOCAL MEDICATIONS USED:  MARCAINE     SPECIMEN:  No Specimen  DISPOSITION OF SPECIMEN:  N/A  COUNTS:  YES  TOURNIQUET:    DICTATION: .Dragon Dictation  Knee arthroscopy dictation  The patient was identified in the preoperative holding area using 2 approved identification mechanisms. The chart was reviewed and updated. The surgical site was confirmed as right knee and marked with an indelible marker.  The patient was taken to the operating room for anesthesia. After successful  general anesthesia, Ancef 2 g was used as IV antibiotics.  The patient was placed in the supine position with the (right leg) the operative extremity in an arthroscopic leg holder and the opposite extremity in a padded leg holder.  The timeout was executed.  A lateral portal was established with an 11 blade and the scope was introduced into the joint. A diagnostic arthroscopy was performed in circumferential manner examining the entire knee joint. A medial portal was established and the diagnostic arthroscopy was repeated using a probe to palpate intra-articular structures as they were encountered.   The operative findings are   Medial normal medial compartment no tear Lateral grade 4 chondral lesion posterior portion of lateral femoral condyle, posterior horn lateral meniscus tear free edge degeneration of body of lateral meniscus, there appeared to be a loose body in the posterior part of  the knee near the posterior horn lateral meniscus. However, this was attached to the soft tissues and was left in place after several attempts at obtaining Patellofemoral grade 2 and 3 chondromalacia of the patellofemoral joint Notch normal anterior cruciate ligament PCL   The lateral meniscus was resected using a motorized shaver until a stable rim was obtained confirmed by probe   The apparent loose body was grabbed with a grasper several times however it was not loose. I also took a shaver and a scissors puncture rounded to try to free it and I was not able to free it. An accessory lateral portal was attempted but since the cartilaginous bony fragment was not loose I decided the better part of valor was to leave it rather than interfere with the condition of the condyle  We did do an abrasion arthroplasty of the exposed bone of the lateral femoral condyle  The arthroscopic pump was placed on the wash mode and any excess debris was removed from the joint using suction.  60 cc of Marcaine with epinephrine was injected through the arthroscope.  The portals were closed with 3-0 nylon suture.  A sterile bandage, Ace wrap and Cryo/Cuff was placed and the Cryo/Cuff was activated. The patient was taken to the recovery room in stable condition.    PLAN OF CARE: Discharge to home after PACU  PATIENT DISPOSITION:  PACU - hemodynamically stable.   Delay start of Pharmacological VTE agent (>24hrs) due to surgical blood loss or risk of bleeding: not applicable  [id number]

## 2016-07-14 NOTE — Transfer of Care (Signed)
Immediate Anesthesia Transfer of Care Note  Patient: Brooke Deleon  Procedure(s) Performed: Procedure(s): KNEE ARTHROSCOPY WITH  LATERAL MENISECTOMY (Right)  Patient Location: PACU  Anesthesia Type:General  Level of Consciousness: awake and alert   Airway & Oxygen Therapy: Patient Spontanous Breathing and Patient connected to face mask oxygen  Post-op Assessment: Report given to RN  Post vital signs: Reviewed and stable  Last Vitals:  Vitals:   07/14/16 1040 07/14/16 1050  BP: 134/78 124/78  Resp: 17 18    Last Pain:  Vitals:   07/14/16 1023  PainSc: 8       Patients Stated Pain Goal: 6 (06/17/00 1117)  Complications: No apparent anesthesia complications

## 2016-07-14 NOTE — Anesthesia Preprocedure Evaluation (Addendum)
Anesthesia Evaluation  Patient identified by MRN, date of birth, ID band Patient awake    Reviewed: Allergy & Precautions, NPO status , Patient's Chart, lab work & pertinent test results  Airway Mallampati: III  TM Distance: <3 FB     Dental  (+) Edentulous Upper, Edentulous Lower   Pulmonary COPD, Current Smoker,           Cardiovascular negative cardio ROS   Rhythm:Regular Rate:Tachycardia     Neuro/Psych PSYCHIATRIC DISORDERS Anxiety Depression    GI/Hepatic PUD, GERD  ,GI bleed with anemia.   Endo/Other    Renal/GU      Musculoskeletal  (+) Fibromyalgia -  Abdominal   Peds  Hematology  (+) anemia ,   Anesthesia Other Findings   Reproductive/Obstetrics                             Anesthesia Physical Anesthesia Plan  ASA: III  Anesthesia Plan: General   Post-op Pain Management:    Induction: Intravenous  PONV Risk Score and Plan:   Airway Management Planned: LMA  Additional Equipment:   Intra-op Plan:   Post-operative Plan: Extubation in OR  Informed Consent: I have reviewed the patients History and Physical, chart, labs and discussed the procedure including the risks, benefits and alternatives for the proposed anesthesia with the patient or authorized representative who has indicated his/her understanding and acceptance.     Plan Discussed with:   Anesthesia Plan Comments:        Anesthesia Quick Evaluation

## 2016-07-14 NOTE — Discharge Instructions (Signed)
PATIENT INSTRUCTIONS °POST-ANESTHESIA ° °IMMEDIATELY FOLLOWING SURGERY:  Do not drive or operate machinery for the first twenty four hours after surgery.  Do not make any important decisions for twenty four hours after surgery or while taking narcotic pain medications or sedatives.  If you develop intractable nausea and vomiting or a severe headache please notify your doctor immediately. ° °FOLLOW-UP:  Please make an appointment with your surgeon as instructed. You do not need to follow up with anesthesia unless specifically instructed to do so. ° °WOUND CARE INSTRUCTIONS (if applicable):  Keep a dry clean dressing on the anesthesia/puncture wound site if there is drainage.  Once the wound has quit draining you may leave it open to air.  Generally you should leave the bandage intact for twenty four hours unless there is drainage.  If the epidural site drains for more than 36-48 hours please call the anesthesia department. ° °QUESTIONS?:  Please feel free to call your physician or the hospital operator if you have any questions, and they will be happy to assist you.    ° ° ° °Knee Arthroscopy, Care After °Refer to this sheet in the next few weeks. These instructions provide you with information about caring for yourself after your procedure. Your health care provider may also give you more specific instructions. Your treatment has been planned according to current medical practices, but problems sometimes occur. Call your health care provider if you have any problems or questions after your procedure. °What can I expect after the procedure? °After the procedure, it is common to have: °· Soreness. °· Pain. ° °Follow these instructions at home: °Bathing °· Do not take baths, swim, or use a hot tub until your health care provider approves. °Incision care °· There are many different ways to close and cover an incision, including stitches, skin glue, and adhesive strips. Follow your health care provider’s instructions  about: °? Incision care. °? Bandage (dressing) changes and removal. °? Incision closure removal. °· Check your incision area every day for signs of infection. Watch for: °? Redness, swelling, or pain. °? Fluid, blood, or pus. °Activity °· Avoid strenuous activities for as long as directed by your health care provider. °· Return to your normal activities as directed by your health care provider. Ask your health care provider what activities are safe for you. °· Perform range-of-motion exercises only as directed by your health care provider. °· Do not lift anything that is heavier than 10 lb (4.5 kg). °· Do not drive or operate heavy machinery while taking pain medicine. °· If you were given crutches, use them as directed by your health care provider. °Managing pain, stiffness, and swelling °· If directed, apply ice to the injured area: °? Put ice in a plastic bag. °? Place a towel between your skin and the bag. °? Leave the ice on for 20 minutes, 2-3 times per day. °· Raise the injured area above the level of your heart while you are sitting or lying down as directed by your health care provider. °General instructions °· Keep all follow-up visits as directed by your health care provider. This is important. °· Take medicines only as directed by your health care provider. °· Do not use any tobacco products, including cigarettes, chewing tobacco, or electronic cigarettes. If you need help quitting, ask your health care provider. °· If you were given compression stockings, wear them as directed by your health care provider. These stockings help prevent blood clots and reduce swelling in your legs. °  Contact a health care provider if: °· You have severe pain with any movement of your knee. °· You notice a bad smell coming from the incision or dressing. °· You have redness, swelling, or pain at the site of your incision. °· You have fluid, blood, or pus coming from your incision. °Get help right away if: °· You develop a  rash. °· You have a fever. °· You have difficulty breathing or have shortness of breath. °· You develop pain in your calves or in the back of your knee. °· You develop chest pain. °· You develop numbness or tingling in your leg or foot. °This information is not intended to replace advice given to you by your health care provider. Make sure you discuss any questions you have with your health care provider. °Document Released: 08/13/2004 Document Revised: 06/26/2015 Document Reviewed: 01/20/2014 °Elsevier Interactive Patient Education © 2017 [employer] ° ° °

## 2016-07-15 ENCOUNTER — Encounter (HOSPITAL_COMMUNITY): Payer: Self-pay | Admitting: Orthopedic Surgery

## 2016-07-18 ENCOUNTER — Ambulatory Visit (INDEPENDENT_AMBULATORY_CARE_PROVIDER_SITE_OTHER): Payer: PPO | Admitting: Orthopedic Surgery

## 2016-07-18 ENCOUNTER — Encounter: Payer: Self-pay | Admitting: Orthopedic Surgery

## 2016-07-18 DIAGNOSIS — Z9889 Other specified postprocedural states: Secondary | ICD-10-CM

## 2016-07-18 DIAGNOSIS — Z4889 Encounter for other specified surgical aftercare: Secondary | ICD-10-CM

## 2016-07-18 NOTE — Progress Notes (Signed)
Postop visit #1 status post knee arthroscopy  Chief Complaint  Patient presents with  . Follow-up    SARK, DOS 07/14/16     07/14/2016  12:02 PM  PATIENT:  Brooke Deleon  61 y.o. female  PRE-OPERATIVE DIAGNOSIS:  RIGHT KNEE LATERAL AND MEDIAL MENISCUS TEARS  POST-OPERATIVE DIAGNOSIS:  arthritis and torn lateral meniscus right knee  PROCEDURE:  Procedure(s): KNEE ARTHROSCOPY WITH  LATERAL MENISECTOMY (Right)  SURGEON:  Surgeon(s) and Role:    Carole Civil, MD - Primary  She is doing well        Current Meds  Medication Sig  . ALPRAZolam (XANAX) 1 MG tablet Take 1 mg by mouth 4 (four) times daily.    . citalopram (CELEXA) 40 MG tablet Take 40 mg by mouth daily.    Marland Kitchen desmopressin (DDAVP) 0.2 MG tablet Take 0.2 mg by mouth at bedtime.   . DULoxetine (CYMBALTA) 30 MG capsule Take 30 mg by mouth every morning.    . fluticasone (FLONASE) 50 MCG/ACT nasal spray Place 1 spray into both nostrils daily.  Marland Kitchen gabapentin (NEURONTIN) 800 MG tablet Take 800 mg by mouth 4 (four) times daily.   . methocarbamol (ROBAXIN) 750 MG tablet Take 750 mg by mouth 3 (three) times daily.    . metoprolol (LOPRESSOR) 100 MG tablet Take 0.5 tablets (50 mg total) by mouth 2 (two) times daily.  Marland Kitchen oxyCODONE-acetaminophen (PERCOCET) 10-325 MG tablet Take 1 tablet by mouth every 4 (four) hours as needed for pain.  . pantoprazole (PROTONIX) 40 MG tablet Take 1 tablet (40 mg total) by mouth 2 (two) times daily before a meal.  . tiZANidine (ZANAFLEX) 2 MG tablet   . traMADol (ULTRAM) 50 MG tablet      The portal sites are clean dry and intact, the sutures were removed.  The calf was supple soft and the Homans sign was negative.  The patient is regaining knee flexion  Physical therapy will be started At home  Status post knee arthroscopy  Follow-up in  3-4 WEEKS

## 2016-07-29 DIAGNOSIS — Z79899 Other long term (current) drug therapy: Secondary | ICD-10-CM | POA: Diagnosis not present

## 2016-07-29 DIAGNOSIS — M545 Low back pain: Secondary | ICD-10-CM | POA: Diagnosis not present

## 2016-07-29 DIAGNOSIS — F17200 Nicotine dependence, unspecified, uncomplicated: Secondary | ICD-10-CM | POA: Diagnosis not present

## 2016-07-29 DIAGNOSIS — I10 Essential (primary) hypertension: Secondary | ICD-10-CM | POA: Diagnosis not present

## 2016-07-29 DIAGNOSIS — M25519 Pain in unspecified shoulder: Secondary | ICD-10-CM | POA: Diagnosis not present

## 2016-07-29 DIAGNOSIS — N393 Stress incontinence (female) (male): Secondary | ICD-10-CM | POA: Diagnosis not present

## 2016-07-29 DIAGNOSIS — G894 Chronic pain syndrome: Secondary | ICD-10-CM | POA: Diagnosis not present

## 2016-07-29 DIAGNOSIS — Q059 Spina bifida, unspecified: Secondary | ICD-10-CM | POA: Diagnosis not present

## 2016-07-29 DIAGNOSIS — J449 Chronic obstructive pulmonary disease, unspecified: Secondary | ICD-10-CM | POA: Diagnosis not present

## 2016-07-29 DIAGNOSIS — F329 Major depressive disorder, single episode, unspecified: Secondary | ICD-10-CM | POA: Diagnosis not present

## 2016-07-29 DIAGNOSIS — M797 Fibromyalgia: Secondary | ICD-10-CM | POA: Diagnosis not present

## 2016-07-29 DIAGNOSIS — R2681 Unsteadiness on feet: Secondary | ICD-10-CM | POA: Diagnosis not present

## 2016-08-02 ENCOUNTER — Encounter (HOSPITAL_COMMUNITY): Payer: Self-pay | Admitting: Family Medicine

## 2016-08-02 DIAGNOSIS — F329 Major depressive disorder, single episode, unspecified: Secondary | ICD-10-CM | POA: Diagnosis not present

## 2016-08-02 DIAGNOSIS — Z79899 Other long term (current) drug therapy: Secondary | ICD-10-CM | POA: Diagnosis not present

## 2016-08-02 DIAGNOSIS — J449 Chronic obstructive pulmonary disease, unspecified: Secondary | ICD-10-CM | POA: Diagnosis not present

## 2016-08-02 DIAGNOSIS — F17210 Nicotine dependence, cigarettes, uncomplicated: Secondary | ICD-10-CM | POA: Diagnosis not present

## 2016-08-02 LAB — SALICYLATE LEVEL: Salicylate Lvl: <7 mg/dL (ref 2.8–30.0)

## 2016-08-02 LAB — COMPREHENSIVE METABOLIC PANEL
ALK PHOS: 90 U/L (ref 38–126)
ALT: 12 U/L — AB (ref 14–54)
AST: 21 U/L (ref 15–41)
Albumin: 3.7 g/dL (ref 3.5–5.0)
Anion gap: 8 (ref 5–15)
BUN: 6 mg/dL (ref 6–20)
CALCIUM: 9.1 mg/dL (ref 8.9–10.3)
CO2: 29 mmol/L (ref 22–32)
CREATININE: 0.99 mg/dL (ref 0.44–1.00)
Chloride: 98 mmol/L — ABNORMAL LOW (ref 101–111)
GFR calc non Af Amer: >60 mL/min (ref >60)
Glucose, Bld: 64 mg/dL — ABNORMAL LOW (ref 65–99)
Potassium: 3.5 mmol/L (ref 3.5–5.1)
SODIUM: 135 mmol/L (ref 135–145)
Total Bilirubin: 0.4 mg/dL (ref 0.3–1.2)
Total Protein: 6.8 g/dL (ref 6.5–8.1)

## 2016-08-02 LAB — CBC
HEMATOCRIT: 41.9 % (ref 36.0–46.0)
Hemoglobin: 13.6 g/dL (ref 12.0–15.0)
MCH: 30.3 pg (ref 26.0–34.0)
MCHC: 32.5 g/dL (ref 30.0–36.0)
MCV: 93.3 fL (ref 78.0–100.0)
Platelets: 290 10*3/uL (ref 150–400)
RBC: 4.49 MIL/uL (ref 3.87–5.11)
RDW: 14.2 % (ref 11.5–15.5)
WBC: 9 10*3/uL (ref 4.0–10.5)

## 2016-08-02 LAB — ACETAMINOPHEN LEVEL: Acetaminophen (Tylenol), Serum: <10 ug/mL — ABNORMAL LOW (ref 10–30)

## 2016-08-02 LAB — RAPID URINE DRUG SCREEN, HOSP PERFORMED
AMPHETAMINES: NOT DETECTED
BARBITURATES: NOT DETECTED
BENZODIAZEPINES: POSITIVE — AB
Cocaine: NOT DETECTED
Opiates: POSITIVE — AB
Tetrahydrocannabinol: NOT DETECTED

## 2016-08-02 LAB — ETHANOL: Alcohol, Ethyl (B): <5 mg/dL (ref <5)

## 2016-08-02 NOTE — ED Triage Notes (Signed)
Patient reports she feels depressed due to "alot that has happen in the past". Pt denies being SI or HI. Pt would like to talk to someone about her depression.

## 2016-08-03 ENCOUNTER — Emergency Department (HOSPITAL_COMMUNITY)
Admission: EM | Admit: 2016-08-03 | Discharge: 2016-08-03 | Disposition: A | Discharge status: Home / Self Care | Payer: PPO | Attending: Emergency Medicine | Admitting: Emergency Medicine

## 2016-08-03 ENCOUNTER — Emergency Department (HOSPITAL_COMMUNITY): Payer: PPO

## 2016-08-03 DIAGNOSIS — F329 Major depressive disorder, single episode, unspecified: Secondary | ICD-10-CM

## 2016-08-03 DIAGNOSIS — J449 Chronic obstructive pulmonary disease, unspecified: Secondary | ICD-10-CM | POA: Diagnosis not present

## 2016-08-03 DIAGNOSIS — Z751 Person awaiting admission to adequate facility elsewhere: Secondary | ICD-10-CM

## 2016-08-03 DIAGNOSIS — F32A Depression, unspecified: Secondary | ICD-10-CM

## 2016-08-03 LAB — URINALYSIS, ROUTINE W REFLEX MICROSCOPIC
Glucose, UA: NEGATIVE mg/dL
Hgb urine dipstick: NEGATIVE
KETONES UR: NEGATIVE mg/dL
Leukocytes, UA: NEGATIVE
Nitrite: NEGATIVE
PH: 5 (ref 5.0–8.0)
PROTEIN: NEGATIVE mg/dL
Specific Gravity, Urine: 1.016 (ref 1.005–1.030)

## 2016-08-03 LAB — CBG MONITORING, ED: GLUCOSE-CAPILLARY: 79 mg/dL (ref 65–99)

## 2016-08-03 MED ORDER — FLUTICASONE PROPIONATE 50 MCG/ACT NA SUSP
1.0000 | Freq: Every day | NASAL | Status: DC
  Administered 2016-08-03: 1 via NASAL
  Filled 2016-08-03: qty 16

## 2016-08-03 MED ORDER — TRAMADOL HCL 50 MG PO TABS: 100.0000 mg | ORAL_TABLET | Freq: Four times a day (QID) | ORAL | Status: DC | PRN

## 2016-08-03 MED ORDER — OXYCODONE-ACETAMINOPHEN 10-325 MG PO TABS: 1.0000 | ORAL_TABLET | Freq: Three times a day (TID) | ORAL | Status: DC | PRN

## 2016-08-03 MED ORDER — DESMOPRESSIN ACETATE 0.2 MG PO TABS
0.2000 mg | ORAL_TABLET | Freq: Every day | ORAL | Status: DC
  Filled 2016-08-03: qty 1

## 2016-08-03 MED ORDER — ALBUTEROL SULFATE HFA 108 (90 BASE) MCG/ACT IN AERS: 1.0000 | INHALATION_SPRAY | Freq: Four times a day (QID) | RESPIRATORY_TRACT | Status: DC | PRN

## 2016-08-03 MED ORDER — METHOCARBAMOL 500 MG PO TABS
750.0000 mg | ORAL_TABLET | Freq: Three times a day (TID) | ORAL | Status: DC
  Administered 2016-08-03 (×2): 750 mg via ORAL
  Filled 2016-08-03 (×2): qty 2

## 2016-08-03 MED ORDER — DULOXETINE HCL 30 MG PO CPEP
30.0000 mg | ORAL_CAPSULE | ORAL | Status: DC
  Administered 2016-08-03: 30 mg via ORAL
  Filled 2016-08-03: qty 1

## 2016-08-03 MED ORDER — METOPROLOL TARTRATE 25 MG PO TABS
50.0000 mg | ORAL_TABLET | Freq: Two times a day (BID) | ORAL | Status: DC
  Administered 2016-08-03: 50 mg via ORAL
  Filled 2016-08-03 (×2): qty 2

## 2016-08-03 MED ORDER — GABAPENTIN 400 MG PO CAPS
800.0000 mg | ORAL_CAPSULE | Freq: Four times a day (QID) | ORAL | Status: DC
  Administered 2016-08-03 (×2): 800 mg via ORAL
  Filled 2016-08-03 (×2): qty 2

## 2016-08-03 MED ORDER — OXYCODONE HCL 5 MG PO TABS
5.0000 mg | ORAL_TABLET | Freq: Three times a day (TID) | ORAL | Status: DC | PRN
  Administered 2016-08-03 (×2): 5 mg via ORAL
  Filled 2016-08-03 (×2): qty 1

## 2016-08-03 MED ORDER — TIZANIDINE HCL 4 MG PO TABS: 2.0000 mg | ORAL_TABLET | Freq: Every day | ORAL | Status: DC | PRN

## 2016-08-03 MED ORDER — PANTOPRAZOLE SODIUM 40 MG PO TBEC
40.0000 mg | DELAYED_RELEASE_TABLET | Freq: Two times a day (BID) | ORAL | Status: DC
  Administered 2016-08-03 (×2): 40 mg via ORAL
  Filled 2016-08-03 (×2): qty 1

## 2016-08-03 MED ORDER — CITALOPRAM HYDROBROMIDE 10 MG PO TABS
40.0000 mg | ORAL_TABLET | Freq: Every day | ORAL | Status: DC
  Administered 2016-08-03: 40 mg via ORAL
  Filled 2016-08-03: qty 4

## 2016-08-03 MED ORDER — ALPRAZOLAM 0.5 MG PO TABS
1.0000 mg | ORAL_TABLET | Freq: Three times a day (TID) | ORAL | Status: DC | PRN
Start: 2016-08-03 — End: 2016-08-04
  Administered 2016-08-03 (×2): 1 mg via ORAL
  Filled 2016-08-03 (×2): qty 2

## 2016-08-03 MED ORDER — OXYCODONE-ACETAMINOPHEN 5-325 MG PO TABS
1.0000 | ORAL_TABLET | Freq: Three times a day (TID) | ORAL | Status: DC | PRN
  Administered 2016-08-03 (×2): 1 via ORAL
  Filled 2016-08-03 (×2): qty 1

## 2016-08-03 NOTE — ED Notes (Signed)
Patient states she wants to go home and get a referral to a psychiatrist from her pain clinic provider. Dr. Ayesha Rumpf notified of patients wishes and says she will talk to the patient.

## 2016-08-03 NOTE — ED Notes (Signed)
Patient voicing that she wants to go home because she has not been placed. Explained to patient that a note was placed that she was accepted at [facility]. Patient stated, she had a friend that went there and she was not impressed with the facility.
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
## 2016-08-03 NOTE — ED Provider Notes (Signed)
Patient in the emergency department pending psychiatric placement for depression. She is decided during her ED stay that this she does not want to go to [facility] for [facility] psych placement. She denies any ongoing SI and feels safe at home. She states that her cat misses her and she has friends to assist her at home. She will contract for safety.  She is calm and appropriate on exam.  Discussed recommendations from psychiatrist for inpatient treatment and patient declined. She was discharged from the hospital [facility]. Discussed that she is welcome to return at any time.   Quintella Reichert, MD 08/03/16 2328

## 2016-08-03 NOTE — Progress Notes (Signed)
CSW received a call from [facility] asking if the pt has had an EKG.  CSW faxed [facility] pt's EKG.  CSW awaiting pt's acceptance status from [place]. Margaretmary Prisk, Reed Pandy, CSI Clinical Social Worker Ph: [phone]
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
## 2016-08-03 NOTE — Progress Notes (Addendum)
CSW received a call from [facility] Pt has been accepted by: [facility] Number for report is: [phone] Pt's unit/room/bed number will be: Room 402-A Geriatric Psyche Unit Accepting physician: Dr. Alonna Minium   Pt can arrive after 7am am on 6/28  CSW will update RN and EDP.  Alphonse Guild. Ryland Tungate, Reed Pandy, CSI Clinical Social Worker Ph: [phone]

## 2016-08-03 NOTE — BH Assessment (Addendum)
[facility] Assessment Progress Note  Per Corena Pilgrim, MD, this pt requires psychiatric hospitalization at this time.  The following facilities have been contacted to seek placement for this pt, with results as noted:  Beds available, information sent, decision pending:  [facility]   Declined:  [facility] (lack of criteria)    Jalene Mullet, MA Triage Specialist [phone]

## 2016-08-03 NOTE — ED Notes (Signed)
Patient alert and oriented upon discharge. RN reviewed discharge instructions and answered all questions. Patient states she will make an appointment with psychiatrist first thing tomorrow.

## 2016-08-05 ENCOUNTER — Ambulatory Visit: Payer: PPO | Admitting: Family Medicine

## 2016-08-09 ENCOUNTER — Ambulatory Visit (INDEPENDENT_AMBULATORY_CARE_PROVIDER_SITE_OTHER): Payer: PPO | Admitting: Orthopedic Surgery

## 2016-08-09 ENCOUNTER — Encounter: Payer: Self-pay | Admitting: Orthopedic Surgery

## 2016-08-09 DIAGNOSIS — Z4889 Encounter for other specified surgical aftercare: Secondary | ICD-10-CM

## 2016-08-09 DIAGNOSIS — Z9889 Other specified postprocedural states: Secondary | ICD-10-CM

## 2016-08-09 NOTE — Progress Notes (Signed)
Post op   Chief Complaint  Patient presents with  . Follow-up    Recheck on right knee, DOS 07-07-16.    PATIENT:  Brooke Deleon  61 y.o. female   PRE-OPERATIVE DIAGNOSIS:  RIGHT KNEE LATERAL AND MEDIAL MENISCUS TEARS   POST-OPERATIVE DIAGNOSIS:  arthritis and torn lateral meniscus right knee   PROCEDURE:  Procedure(s): KNEE ARTHROSCOPY WITH  LATERAL MENISECTOMY (Right)   SURGEON:  Surgeon(s) and Role:    Carole Civil, MD - Primary   She is doing well   Right knee 0-125 degrees No swelling no tenderness  Left knee is actually bothering her but it's arthritic she can't take NSAIDs will see if she redistributes her weight now that her right knee is better enough that goes away if it doesn't she'll calls back for re-valuation

## 2016-08-29 DIAGNOSIS — Q059 Spina bifida, unspecified: Secondary | ICD-10-CM | POA: Diagnosis not present

## 2016-08-29 DIAGNOSIS — F329 Major depressive disorder, single episode, unspecified: Secondary | ICD-10-CM | POA: Diagnosis not present

## 2016-08-29 DIAGNOSIS — R2681 Unsteadiness on feet: Secondary | ICD-10-CM | POA: Diagnosis not present

## 2016-08-29 DIAGNOSIS — G894 Chronic pain syndrome: Secondary | ICD-10-CM | POA: Diagnosis not present

## 2016-08-29 DIAGNOSIS — M797 Fibromyalgia: Secondary | ICD-10-CM | POA: Diagnosis not present

## 2016-08-29 DIAGNOSIS — M545 Low back pain: Secondary | ICD-10-CM | POA: Diagnosis not present

## 2016-08-29 DIAGNOSIS — Z79899 Other long term (current) drug therapy: Secondary | ICD-10-CM | POA: Diagnosis not present

## 2016-08-29 DIAGNOSIS — J449 Chronic obstructive pulmonary disease, unspecified: Secondary | ICD-10-CM | POA: Diagnosis not present

## 2016-08-29 DIAGNOSIS — M25519 Pain in unspecified shoulder: Secondary | ICD-10-CM | POA: Diagnosis not present

## 2016-08-29 DIAGNOSIS — I10 Essential (primary) hypertension: Secondary | ICD-10-CM | POA: Diagnosis not present

## 2016-08-29 DIAGNOSIS — N393 Stress incontinence (female) (male): Secondary | ICD-10-CM | POA: Diagnosis not present

## 2016-08-29 DIAGNOSIS — F17200 Nicotine dependence, unspecified, uncomplicated: Secondary | ICD-10-CM | POA: Diagnosis not present

## 2016-09-15 ENCOUNTER — Encounter: Payer: Self-pay | Admitting: Family Medicine

## 2016-09-15 ENCOUNTER — Ambulatory Visit (INDEPENDENT_AMBULATORY_CARE_PROVIDER_SITE_OTHER): Payer: PPO | Admitting: Family Medicine

## 2016-09-15 VITALS — BP 128/82 | HR 80 | Temp 97.7°F | Resp 18 | Ht 60.0 in | Wt 136.1 lb

## 2016-09-15 DIAGNOSIS — F4310 Post-traumatic stress disorder, unspecified: Secondary | ICD-10-CM

## 2016-09-15 DIAGNOSIS — G894 Chronic pain syndrome: Secondary | ICD-10-CM

## 2016-09-15 DIAGNOSIS — I700 Atherosclerosis of aorta: Secondary | ICD-10-CM | POA: Insufficient documentation

## 2016-09-15 DIAGNOSIS — G629 Polyneuropathy, unspecified: Secondary | ICD-10-CM

## 2016-09-15 DIAGNOSIS — Z720 Tobacco use: Secondary | ICD-10-CM

## 2016-09-15 NOTE — Patient Instructions (Signed)
I believe you are on too much medicine I recommend you try and reduce the xanax and pain medicine and muscle relaxers  Stay as active as you can manage  You do need to see a psychiatrist  You need to find another primary care doctor

## 2016-09-15 NOTE — Progress Notes (Signed)
Here as new patient for consultation regarding joining practice. Medical history reviewed Medications reviewed I discussed with patient I do not feel she is currently taking a safe combination of drugs, and that I feel she is over medicated/sedated. Specifically is on xanax, oxycodone, tramadol, robaxin, tizanidine, and gabapentin. She appears slightly sedated/slurred in her presentation. She is unwilling to consider a reduction in her medicines  I recommend she obtain medical care elsewhere.

## 2016-10-09 ENCOUNTER — Emergency Department (HOSPITAL_COMMUNITY)
Admission: EM | Admit: 2016-10-09 | Discharge: 2016-10-09 | Disposition: A | Discharge status: Home / Self Care | Payer: PPO | Attending: Emergency Medicine | Admitting: Emergency Medicine

## 2016-10-09 ENCOUNTER — Encounter (HOSPITAL_COMMUNITY): Payer: Self-pay | Admitting: Cardiology

## 2016-10-09 ENCOUNTER — Emergency Department (HOSPITAL_COMMUNITY): Payer: PPO

## 2016-10-09 DIAGNOSIS — L02413 Cutaneous abscess of right upper limb: Secondary | ICD-10-CM | POA: Diagnosis not present

## 2016-10-09 DIAGNOSIS — L0291 Cutaneous abscess, unspecified: Secondary | ICD-10-CM

## 2016-10-09 DIAGNOSIS — J45909 Unspecified asthma, uncomplicated: Secondary | ICD-10-CM | POA: Insufficient documentation

## 2016-10-09 DIAGNOSIS — I10 Essential (primary) hypertension: Secondary | ICD-10-CM | POA: Diagnosis not present

## 2016-10-09 DIAGNOSIS — Z79899 Other long term (current) drug therapy: Secondary | ICD-10-CM | POA: Insufficient documentation

## 2016-10-09 DIAGNOSIS — E860 Dehydration: Secondary | ICD-10-CM | POA: Insufficient documentation

## 2016-10-09 DIAGNOSIS — L03113 Cellulitis of right upper limb: Secondary | ICD-10-CM | POA: Diagnosis not present

## 2016-10-09 DIAGNOSIS — J449 Chronic obstructive pulmonary disease, unspecified: Secondary | ICD-10-CM | POA: Insufficient documentation

## 2016-10-09 DIAGNOSIS — F17210 Nicotine dependence, cigarettes, uncomplicated: Secondary | ICD-10-CM | POA: Diagnosis not present

## 2016-10-09 DIAGNOSIS — M25531 Pain in right wrist: Secondary | ICD-10-CM | POA: Diagnosis not present

## 2016-10-09 DIAGNOSIS — S2195XA Open bite of unspecified part of thorax, initial encounter: Secondary | ICD-10-CM | POA: Diagnosis not present

## 2016-10-09 DIAGNOSIS — R2231 Localized swelling, mass and lump, right upper limb: Secondary | ICD-10-CM | POA: Diagnosis present

## 2016-10-09 LAB — COMPREHENSIVE METABOLIC PANEL
ALT: 13 U/L — AB (ref 14–54)
AST: 21 U/L (ref 15–41)
Albumin: 3.5 g/dL (ref 3.5–5.0)
Alkaline Phosphatase: 99 U/L (ref 38–126)
Anion gap: 8 (ref 5–15)
BILIRUBIN TOTAL: 0.4 mg/dL (ref 0.3–1.2)
BUN: 9 mg/dL (ref 6–20)
CALCIUM: 9 mg/dL (ref 8.9–10.3)
CO2: 29 mmol/L (ref 22–32)
CREATININE: 0.96 mg/dL (ref 0.44–1.00)
Chloride: 94 mmol/L — ABNORMAL LOW (ref 101–111)
GFR calc Af Amer: >60 mL/min (ref >60)
Glucose, Bld: 118 mg/dL — ABNORMAL HIGH (ref 65–99)
Potassium: 3.8 mmol/L (ref 3.5–5.1)
Sodium: 131 mmol/L — ABNORMAL LOW (ref 135–145)
Total Protein: 6.7 g/dL (ref 6.5–8.1)

## 2016-10-09 LAB — CBC WITH DIFFERENTIAL/PLATELET
BASOS ABS: 0.1 10*3/uL (ref 0.0–0.1)
BASOS PCT: 1 %
EOS ABS: 0.2 10*3/uL (ref 0.0–0.7)
Eosinophils Relative: 2 %
HCT: 40.2 % (ref 36.0–46.0)
HEMOGLOBIN: 13.6 g/dL (ref 12.0–15.0)
Lymphocytes Relative: 14 %
Lymphs Abs: 1.4 10*3/uL (ref 0.7–4.0)
MCH: 31.3 pg (ref 26.0–34.0)
MCHC: 33.8 g/dL (ref 30.0–36.0)
MCV: 92.6 fL (ref 78.0–100.0)
Monocytes Absolute: 1.1 10*3/uL — ABNORMAL HIGH (ref 0.1–1.0)
Monocytes Relative: 12 %
NEUTROS PCT: 71 %
Neutro Abs: 7 10*3/uL (ref 1.7–7.7)
Platelets: 291 10*3/uL (ref 150–400)
RBC: 4.34 MIL/uL (ref 3.87–5.11)
RDW: 12.8 % (ref 11.5–15.5)
WBC: 9.9 10*3/uL (ref 4.0–10.5)

## 2016-10-09 LAB — URINALYSIS, ROUTINE W REFLEX MICROSCOPIC
Bilirubin Urine: NEGATIVE
GLUCOSE, UA: NEGATIVE mg/dL
HGB URINE DIPSTICK: NEGATIVE
Ketones, ur: NEGATIVE mg/dL
Leukocytes, UA: NEGATIVE
Nitrite: NEGATIVE
Protein, ur: NEGATIVE mg/dL
SPECIFIC GRAVITY, URINE: 1.008 (ref 1.005–1.030)
pH: 7 (ref 5.0–8.0)

## 2016-10-09 LAB — I-STAT CG4 LACTIC ACID, ED: Lactic Acid, Venous: 0.71 mmol/L (ref 0.5–1.9)

## 2016-10-09 LAB — LACTIC ACID, PLASMA: LACTIC ACID, VENOUS: 0.6 mmol/L (ref 0.5–1.9)

## 2016-10-09 MED ORDER — SODIUM CHLORIDE 0.9 % IV BOLUS (SEPSIS)
2000.0000 mL | Freq: Once | INTRAVENOUS | Status: AC
  Administered 2016-10-09: 2000 mL via INTRAVENOUS

## 2016-10-09 MED ORDER — DOXYCYCLINE HYCLATE 100 MG PO CAPS: 100.0000 mg | ORAL_CAPSULE | Freq: Two times a day (BID) | ORAL | 0 refills | Status: DC

## 2016-10-09 MED ORDER — POVIDONE-IODINE 10 % EX SOLN
CUTANEOUS | Status: AC
  Filled 2016-10-09: qty 15

## 2016-10-09 MED ORDER — VANCOMYCIN HCL IN DEXTROSE 1-5 GM/200ML-% IV SOLN
1000.0000 mg | Freq: Once | INTRAVENOUS | Status: AC
  Administered 2016-10-09: 1000 mg via INTRAVENOUS
  Filled 2016-10-09: qty 200

## 2016-10-09 MED ORDER — LIDOCAINE HCL (PF) 1 % IJ SOLN
INTRAMUSCULAR | Status: AC
  Filled 2016-10-09: qty 5

## 2016-10-09 NOTE — ED Notes (Signed)
This NT walked into pt room and pt was taking home 5:00 meds, This NT then instructed pt to hold off taking home meds because RN will give meds here and taking home meds could risk over medicating. RN Notified of pt taking home meds.

## 2016-10-09 NOTE — ED Provider Notes (Signed)
[facility] DEPT Provider Note   CSN: [id number] Arrival date & time: 10/09/16  1426     History   Chief Complaint Chief Complaint  Patient presents with  . Insect Bite    HPI Brooke Deleon is a 61 y.o. female.  Patient complains of swelling in pain to the dorsum of right wrist. Patient thinks she was bitten by an insect but she did not see anything   The history is provided by the patient.  Rash   This is a new problem. The current episode started yesterday. The problem has not changed since onset.The problem is associated with nothing. There has been no fever. Affected Location: right wrist. The pain is at a severity of 6/10. The pain is moderate.    Past Medical History:  Diagnosis Date  . Allergy   . Anemia   . Anxiety   . Arthritis   . Asthma   . COPD (chronic obstructive pulmonary disease) ([place])   . Depression   . Fibromyalgia   . Gastric ulcer   . GERD (gastroesophageal reflux disease)   . Hypertension   . Neuropathy   . Stress incontinence   . Tachycardia     Patient Active Problem List   Diagnosis Date Noted  . Chronic pain disorder 09/15/2016  . Neuropathy 09/15/2016  . PTSD (post-traumatic stress disorder) 09/15/2016  . Aortic atherosclerosis ([place]) 09/15/2016  . Tobacco abuse 09/15/2016  . Esophageal dysphagia 04/28/2016  . Constipation 04/28/2016  . Anemia   . COPD with acute exacerbation ([place]) 02/29/2016  . Upper GI bleed 02/29/2016    Past Surgical History:  Procedure Laterality Date  . BIOPSY  03/01/2016   Procedure: BIOPSY;  Surgeon: Danie Binder, MD;  Location: [facility];  Service: Endoscopy;;  gastric bx's  . COLONOSCOPY     Dr. Doristine Mango: normal. next TCS 10 years  . [facility]  [facility]  [facility] ESOPHAGOGASTRODUODENOSCOPY (EGD) WITH PROPOFOL N/A 03/01/2016   Dr. Oneida Alar: Upper GI bleed secondary to large gastric ulcer. Biopsies benign, no H pylori  . ESOPHAGOGASTRODUODENOSCOPY  (EGD) WITH PROPOFOL N/A 05/30/2016   Procedure: ESOPHAGOGASTRODUODENOSCOPY (EGD) WITH PROPOFOL;  Surgeon: Daneil Dolin, MD;  Location: [facility];  Service: Endoscopy;  Laterality: N/A;  8:15am  . INCONTINENCE SURGERY    . KNEE ARTHROSCOPY WITH MEDIAL MENISECTOMY Right 07/14/2016   Procedure: KNEE ARTHROSCOPY WITH  LATERAL MENISECTOMY;  Surgeon: Carole Civil, MD;  Location: [facility] ORS;  Service: Orthopedics;  Laterality: Right;  . MALONEY DILATION N/A 05/30/2016   Procedure: Venia Minks DILATION;  Surgeon: Daneil Dolin, MD;  Location: [facility];  Service: Endoscopy;  Laterality: N/A;  . SPLENECTOMY, PARTIAL     [facility] due [facility]  . TONSILLECTOMY     child  . TUBAL LIGATION      OB History    No data available       Home Medications    Prior to Admission medications   Medication Sig Start Date End Date Taking? Authorizing Provider  albuterol (PROVENTIL HFA;VENTOLIN HFA) 108 (90 Base) MCG/ACT inhaler Inhale 1-2 puffs into the lungs every 6 (six) hours as needed for wheezing or shortness of breath.   Yes [provider]  ALPRAZolam Duanne Moron) 1 MG tablet Take 1 mg by mouth 4 (four) times daily.     Yes [provider]  cholecalciferol (VITAMIN D) 1000 units tablet Take 1,000 Units by mouth daily.  Yes [provider]  citalopram (CELEXA) 40 MG tablet Take 40 mg by mouth daily.     Yes [provider]  desmopressin (DDAVP) 0.2 MG tablet Take 0.2 mg by mouth at bedtime.    Yes [provider]  DULoxetine (CYMBALTA) 30 MG capsule Take 30 mg by mouth every morning.     Yes [provider]  ferrous sulfate 325 (65 FE) MG tablet Take 325 mg by mouth daily with breakfast.   Yes [provider]  fluticasone (FLONASE) 50 MCG/ACT nasal spray Place 1 spray into both nostrils daily.   Yes [provider]  gabapentin (NEURONTIN) 800 MG tablet Take 800 mg by mouth 4 (four) times daily.    Yes  [provider]  methocarbamol (ROBAXIN) 500 MG tablet Take 500-1,000 mg by mouth 3 (three) times daily.  09/03/16  Yes [provider]  metoprolol succinate (TOPROL-XL) 100 MG 24 hr tablet Take 50 mg by mouth 2 (two) times daily.  09/05/16  Yes [provider]  Multiple Vitamin (MULTIVITAMIN WITH MINERALS) TABS tablet Take 1 tablet by mouth daily.   Yes [provider]  oxyCODONE-acetaminophen (PERCOCET) 10-325 MG tablet Take 1 tablet by mouth every 4 (four) hours as needed for pain. Patient taking differently: Take 0.5-1 tablets by mouth every 4 (four) hours as needed for pain.  07/14/16  Yes Carole Civil, MD  pantoprazole (PROTONIX) 40 MG tablet Take 1 tablet (40 mg total) by mouth 2 (two) times daily before a meal. Patient taking differently: Take 40 mg by mouth every morning.  07/14/16  Yes Carole Civil, MD  tiZANidine (ZANAFLEX) 2 MG tablet Take 2 mg by mouth daily as needed for muscle spasms.  06/13/16  Yes [provider]  traMADol (ULTRAM) 50 MG tablet Take 50-100 mg by mouth every 6 (six) hours as needed for moderate pain.  06/25/16  Yes [provider]  vitamin B-12 (CYANOCOBALAMIN) 1000 MCG tablet Take 1,000 mcg by mouth daily.   Yes [provider]  vitamin C (ASCORBIC ACID) 500 MG tablet Take 1,000 mg by mouth daily.   Yes [provider]  doxycycline (VIBRAMYCIN) 100 MG capsule Take 1 capsule (100 mg total) by mouth 2 (two) times daily. One po bid x 7 days 10/09/16   Milton Ferguson, MD    Family History Family History  Problem Relation Age of Onset  . Alcohol abuse Mother   . Asthma Mother   . Heart disease Father   . Alcohol abuse Father   . Early death Sister   . Early death Brother        suicide  . Anesthesia problems Neg Hx   . Hypotension Neg Hx   . Malignant hyperthermia Neg Hx   . Pseudochol deficiency Neg Hx   . Colon cancer Neg Hx     Social History Social History  Substance Use  Topics  . Smoking status: Current Every Day Smoker    Packs/day: 1.00    Years: 40.00    Types: Cigarettes    Start date: 02/08/1968  . Smokeless tobacco: Never Used  . Alcohol use No     Allergies   Codeine   Review of Systems Review of Systems  Constitutional: Negative for appetite change and fatigue.  HENT: Negative for congestion, ear discharge and sinus pressure.   Eyes: Negative for discharge.  Respiratory: Negative for cough.   Cardiovascular: Negative for chest pain.  Gastrointestinal: Negative for abdominal pain and diarrhea.  Genitourinary: Negative for frequency and hematuria.  Musculoskeletal: Negative for back pain.       Pain in right wrist  Skin: Positive for rash.  Neurological: Negative for seizures and headaches.  Psychiatric/Behavioral: Negative for hallucinations.     Physical Exam Updated Vital Signs BP 130/82   Pulse (!) 58   Temp 97.7 F (36.5 C) (Oral)   Resp 12   Ht 5' (1.524 m)   Wt 62.6 kg (138 lb)   SpO2 99%   BMI 26.95 kg/m   Physical Exam  Constitutional: She is oriented to person, place, and time. She appears well-developed.  HENT:  Head: Normocephalic.  Eyes: Conjunctivae and EOM are normal. No scleral icterus.  Neck: Neck supple. No thyromegaly present.  Cardiovascular: Normal rate and regular rhythm.  Exam reveals no gallop and no friction rub.   No murmur heard. Pulmonary/Chest: No stridor. She has no wheezes. She has no rales. She exhibits no tenderness.  Abdominal: She exhibits no distension. There is no tenderness. There is no rebound.  Musculoskeletal: Normal range of motion. She exhibits no edema.  Lymphadenopathy:    She has no cervical adenopathy.  Neurological: She is oriented to person, place, and time. She exhibits normal muscle tone. Coordination normal.  Skin: No rash noted. There is erythema.  Swelling redness tender area to dorsum of right wrist approximately 1.5 cm diameter  Psychiatric: She has a normal mood  and affect. Her behavior is normal.     ED Treatments / Results  Labs (all labs ordered are listed, but only abnormal results are displayed) Labs Reviewed  COMPREHENSIVE METABOLIC PANEL - Abnormal; Notable for the following:       Result Value   Sodium 131 (*)    Chloride 94 (*)    Glucose, Bld 118 (*)    ALT 13 (*)    All other components within normal limits  CBC WITH DIFFERENTIAL/PLATELET - Abnormal; Notable for the following:    Monocytes Absolute 1.1 (*)    All other components within normal limits  CULTURE, BLOOD (ROUTINE X 2)  CULTURE, BLOOD (ROUTINE X 2)  AEROBIC CULTURE (SUPERFICIAL SPECIMEN)  URINALYSIS, ROUTINE W REFLEX MICROSCOPIC  LACTIC ACID, PLASMA  I-STAT CG4 LACTIC ACID, ED  I-STAT CG4 LACTIC ACID, ED    EKG  EKG Interpretation None       Radiology Dg Chest 2 View  Result Date: 10/09/2016 CLINICAL DATA:  Spider bite.  Pain. EXAM: CHEST  2 VIEW COMPARISON:  08/03/2016 and prior radiographs FINDINGS: The cardiomediastinal silhouette is unchanged. There is no evidence of focal airspace disease, pulmonary edema, suspicious pulmonary nodule/mass, pleural effusion, or pneumothorax. No acute bony abnormalities are identified. A midthoracic compression fracture is again identified. IMPRESSION: No active cardiopulmonary disease. Electronically Signed   By: Margarette Canada M.D.   On: 10/09/2016 17:47   Dg Wrist Complete Right  Result Date: 10/09/2016 CLINICAL DATA:  Acute right wrist pain following spider bite. Initial encounter. EXAM: RIGHT WRIST - COMPLETE 3+ VIEW COMPARISON:  None. FINDINGS: No acute fracture, subluxation or dislocation identified. No radiopaque foreign bodies are present. Soft tissue swelling is identified. No suspicious focal bony lesions are noted. IMPRESSION: Soft tissue swelling without acute bony abnormality. Electronically Signed   By: Margarette Canada M.D.   On: 10/09/2016 17:48    Procedures .Marland KitchenIncision and Drainage Date/Time: 10/09/2016 7:49  PM Performed by: Milton Ferguson Authorized by: Milton Ferguson   Comments:     Patient had an abscess to right wrist.  Area was cleaned thoroughly with Betadine. 1% lidocaine without  Was used to anesthetize the area. A #11 blade was used to open the abscess. Moderate amount of blood and pus was removed. Cultures were taken. Patient tolerated the procedure well   (including critical care time)  Medications Ordered in ED Medications  lidocaine (PF) (XYLOCAINE) 1 % injection (not administered)  povidone-iodine (BETADINE) 10 % external solution (not administered)  sodium chloride 0.9 % bolus 2,000 mL (0 mLs Intravenous Stopped 10/09/16 1830)  vancomycin (VANCOCIN) IVPB 1000 mg/200 mL premix (0 mg Intravenous Stopped 10/09/16 1730)     Initial Impression / Assessment and Plan / ED Course  I have reviewed the triage vital signs and the nursing notes.  Pertinent labs & imaging results that were available during my care of the patient were reviewed by me and considered in my medical decision making (see chart for details).     CRITICAL CARE Performed by: Jerrico Covello L Total critical care time:45 minutes Critical care time was exclusive of separately billable procedures and treating other patients. Critical care was necessary to treat or prevent imminent or life-threatening deterioration. Critical care was time spent personally by me on the following activities: development of treatment plan with patient and/or surrogate as well as nursing, discussions with consultants, evaluation of patient's response to treatment, examination of patient, obtaining history from patient or surrogate, ordering and performing treatments and interventions, ordering and review of laboratory studies, ordering and review of radiographic studies, pulse oximetry and re-evaluation of patient's condition.  Initially the patient's blood pressure was extremely low so sepsis protocol was initiated. I suspect that she was not  septic and she was just mildly dehydrated. She also has an abscess to her right wrist with mild cellulitis. Patient will be placed on doxycycline and follow-up with his family doctor or orthopedics Final Clinical Impressions(s) / ED Diagnoses   Final diagnoses:  Abscess    New Prescriptions New Prescriptions   DOXYCYCLINE (VIBRAMYCIN) 100 MG CAPSULE    Take 1 capsule (100 mg total) by mouth 2 (two) times daily. One po bid x 7 days     Milton Ferguson, MD 10/09/16 1950

## 2016-10-09 NOTE — ED Notes (Signed)
Pt reports that she was bitten by a spider in her sleep last night  She has a reddened swollen area to her right ulnar wrist

## 2016-10-09 NOTE — ED Notes (Signed)
I stat lactic 0.71

## 2016-10-09 NOTE — ED Notes (Addendum)
Pt hypotensive in triage.  Pt denies feeling light headed.

## 2016-10-09 NOTE — ED Notes (Signed)
From radiology 

## 2016-10-09 NOTE — ED Notes (Signed)
Pt was taking her home meds at 1700   Gabapentin 800mg  Oxycodone 5mg  Robaxin 500mg  Xanax 1 mg  Dr Roderic Palau informed

## 2016-10-09 NOTE — ED Triage Notes (Signed)
Thinks she was bit by a spider Wednesday night while sleeping.  Red area to right wrist.

## 2016-10-09 NOTE — ED Notes (Signed)
Call from [place]- please have another lactate prior to 1900

## 2016-10-09 NOTE — ED Notes (Signed)
From Radiology 

## 2016-10-09 NOTE — ED Notes (Signed)
Pt took 800mg  gabapentin,  5 mg oxycodone, robaxin 500mg  and xanax 1 mg in room.  Dr. Roderic Palau notified.

## 2016-10-09 NOTE — Discharge Instructions (Signed)
Follow up with your family md or dr. Aline Brochure next this week for recheck

## 2016-10-12 LAB — AEROBIC CULTURE W GRAM STAIN (SUPERFICIAL SPECIMEN): Culture: NO GROWTH

## 2016-10-14 LAB — CULTURE, BLOOD (ROUTINE X 2)
Culture: NO GROWTH
Culture: NO GROWTH
SPECIAL REQUESTS: ADEQUATE
Special Requests: ADEQUATE

## 2016-10-17 DIAGNOSIS — T7840XA Allergy, unspecified, initial encounter: Secondary | ICD-10-CM | POA: Diagnosis not present

## 2016-10-17 DIAGNOSIS — I10 Essential (primary) hypertension: Secondary | ICD-10-CM | POA: Diagnosis not present

## 2016-10-17 DIAGNOSIS — Z6826 Body mass index (BMI) 26.0-26.9, adult: Secondary | ICD-10-CM | POA: Diagnosis not present

## 2016-10-17 DIAGNOSIS — M549 Dorsalgia, unspecified: Secondary | ICD-10-CM | POA: Diagnosis not present

## 2016-10-17 DIAGNOSIS — K219 Gastro-esophageal reflux disease without esophagitis: Secondary | ICD-10-CM | POA: Diagnosis not present

## 2016-10-17 DIAGNOSIS — R110 Nausea: Secondary | ICD-10-CM | POA: Diagnosis not present

## 2016-10-17 DIAGNOSIS — F329 Major depressive disorder, single episode, unspecified: Secondary | ICD-10-CM | POA: Diagnosis not present

## 2016-10-17 DIAGNOSIS — N393 Stress incontinence (female) (male): Secondary | ICD-10-CM | POA: Diagnosis not present

## 2016-10-17 DIAGNOSIS — Z299 Encounter for prophylactic measures, unspecified: Secondary | ICD-10-CM | POA: Diagnosis not present

## 2016-10-17 DIAGNOSIS — G629 Polyneuropathy, unspecified: Secondary | ICD-10-CM | POA: Diagnosis not present

## 2016-10-17 DIAGNOSIS — M791 Myalgia: Secondary | ICD-10-CM | POA: Diagnosis not present

## 2016-11-03 DIAGNOSIS — Z79899 Other long term (current) drug therapy: Secondary | ICD-10-CM | POA: Diagnosis not present

## 2016-11-03 DIAGNOSIS — M25519 Pain in unspecified shoulder: Secondary | ICD-10-CM | POA: Diagnosis not present

## 2016-11-03 DIAGNOSIS — M797 Fibromyalgia: Secondary | ICD-10-CM | POA: Diagnosis not present

## 2016-11-03 DIAGNOSIS — M545 Low back pain: Secondary | ICD-10-CM | POA: Diagnosis not present

## 2016-11-03 DIAGNOSIS — G894 Chronic pain syndrome: Secondary | ICD-10-CM | POA: Diagnosis not present

## 2016-11-03 DIAGNOSIS — Z79891 Long term (current) use of opiate analgesic: Secondary | ICD-10-CM | POA: Diagnosis not present

## 2016-11-29 ENCOUNTER — Ambulatory Visit: Payer: PPO | Admitting: Gastroenterology

## 2016-12-24 ENCOUNTER — Other Ambulatory Visit: Payer: Self-pay | Admitting: Orthopedic Surgery

## 2016-12-26 DIAGNOSIS — Z299 Encounter for prophylactic measures, unspecified: Secondary | ICD-10-CM | POA: Diagnosis not present

## 2016-12-26 DIAGNOSIS — J209 Acute bronchitis, unspecified: Secondary | ICD-10-CM | POA: Diagnosis not present

## 2016-12-26 DIAGNOSIS — Z6827 Body mass index (BMI) 27.0-27.9, adult: Secondary | ICD-10-CM | POA: Diagnosis not present

## 2016-12-26 DIAGNOSIS — N393 Stress incontinence (female) (male): Secondary | ICD-10-CM | POA: Diagnosis not present

## 2016-12-26 DIAGNOSIS — I10 Essential (primary) hypertension: Secondary | ICD-10-CM | POA: Diagnosis not present

## 2016-12-26 DIAGNOSIS — K219 Gastro-esophageal reflux disease without esophagitis: Secondary | ICD-10-CM | POA: Diagnosis not present

## 2016-12-27 ENCOUNTER — Ambulatory Visit: Payer: PPO | Admitting: Urology

## 2016-12-27 ENCOUNTER — Ambulatory Visit (INDEPENDENT_AMBULATORY_CARE_PROVIDER_SITE_OTHER): Payer: PPO

## 2016-12-27 ENCOUNTER — Encounter: Payer: Self-pay | Admitting: Orthopaedic Surgery

## 2016-12-27 ENCOUNTER — Ambulatory Visit: Payer: PPO | Admitting: Orthopaedic Surgery

## 2016-12-27 VITALS — BP 129/87 | HR 70 | Temp 97.4°F | Ht 60.0 in | Wt 140.0 lb

## 2016-12-27 DIAGNOSIS — M25511 Pain in right shoulder: Secondary | ICD-10-CM | POA: Diagnosis not present

## 2016-12-27 DIAGNOSIS — F17210 Nicotine dependence, cigarettes, uncomplicated: Secondary | ICD-10-CM

## 2016-12-27 NOTE — Patient Instructions (Signed)
Steps to Quit Smoking Smoking tobacco can be bad for your health. It can also affect almost every organ in your body. Smoking puts you and people around you at risk for many serious Brooke Deleon-lasting (chronic) diseases. Quitting smoking is hard, but it is one of the best things that you can do for your health. It is never too late to quit. What are the benefits of quitting smoking? When you quit smoking, you lower your risk for getting serious diseases and conditions. They can include:  Lung cancer or lung disease.  Heart disease.  Stroke.  Heart attack.  Not being able to have children (infertility).  Weak bones (osteoporosis) and broken bones (fractures).  If you have coughing, wheezing, and shortness of breath, those symptoms may get better when you quit. You may also get sick less often. If you are pregnant, quitting smoking can help to lower your chances of having a baby of low birth weight. What can I do to help me quit smoking? Talk with your doctor about what can help you quit smoking. Some things you can do (strategies) include:  Quitting smoking totally, instead of slowly cutting back how much you smoke over a period of time.  Going to in-person counseling. You are more likely to quit if you go to many counseling sessions.  Using resources and support systems, such as: ? Online chats with a counselor. ? Phone quitlines. ? Printed self-help materials. ? Support groups or group counseling. ? Text messaging programs. ? Mobile phone apps or applications.  Taking medicines. Some of these medicines may have nicotine in them. If you are pregnant or breastfeeding, do not take any medicines to quit smoking unless your doctor says it is okay. Talk with your doctor about counseling or other things that can help you.  Talk with your doctor about using more than one strategy at the same time, such as taking medicines while you are also going to in-person counseling. This can help make  quitting easier. What things can I do to make it easier to quit? Quitting smoking might feel very hard at first, but there is a lot that you can do to make it easier. Take these steps:  Talk to your family and friends. Ask them to support and encourage you.  Call phone quitlines, reach out to support groups, or work with a counselor.  Ask people who smoke to not smoke around you.  Avoid places that make you want (trigger) to smoke, such as: ? Bars. ? Parties. ? Smoke-break areas at work.  Spend time with people who do not smoke.  Lower the stress in your life. Stress can make you want to smoke. Try these things to help your stress: ? Getting regular exercise. ? Deep-breathing exercises. ? Yoga. ? Meditating. ? Doing a body scan. To do this, close your eyes, focus on one area of your body at a time from head to toe, and notice which parts of your body are tense. Try to relax the muscles in those areas.  Download or buy apps on your mobile phone or tablet that can help you stick to your quit plan. There are many free apps, such as QuitGuide from the CDC (Centers for Disease Control and Prevention). You can find more support from smokefree.gov and other websites.  This information is not intended to replace advice given to you by your health care provider. Make sure you discuss any questions you have with your health care provider. Document Released: 11/20/2008 Document   Revised: 09/22/2015 Document Reviewed: 06/10/2014 Elsevier Interactive Patient Education  2018 [employer]  
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
## 2017-01-04 DIAGNOSIS — M797 Fibromyalgia: Secondary | ICD-10-CM | POA: Diagnosis not present

## 2017-01-04 DIAGNOSIS — Z79891 Long term (current) use of opiate analgesic: Secondary | ICD-10-CM | POA: Diagnosis not present

## 2017-01-04 DIAGNOSIS — M25519 Pain in unspecified shoulder: Secondary | ICD-10-CM | POA: Diagnosis not present

## 2017-01-04 DIAGNOSIS — M545 Low back pain: Secondary | ICD-10-CM | POA: Diagnosis not present

## 2017-01-10 ENCOUNTER — Ambulatory Visit (INDEPENDENT_AMBULATORY_CARE_PROVIDER_SITE_OTHER): Payer: PPO | Admitting: Orthopaedic Surgery

## 2017-01-10 ENCOUNTER — Encounter: Payer: Self-pay | Admitting: Orthopaedic Surgery

## 2017-01-10 VITALS — BP 143/87 | HR 98 | Temp 97.7°F | Ht 61.0 in | Wt 138.0 lb

## 2017-01-10 DIAGNOSIS — M25511 Pain in right shoulder: Secondary | ICD-10-CM | POA: Diagnosis not present

## 2017-01-10 NOTE — Progress Notes (Signed)
Patient Brooke Deleon:JWJXB Brooke Deleon, female DOB:January 15, 1956, 61 y.o. JYN:[id number]  Chief Complaint  Patient presents with  . Shoulder Pain    Right    HPI  Brooke Deleon is a 61 y.o. female who has shoulder pain on the right still.  She has had pain in the right shoulder since April when I saw her in April of this year for the right shoulder and her knee.  She has had MRI of the right knee. The right shoulder is worse.  She has more pain with overhead use and stretching of the right arm. She is awakened with pain from the shoulder.  I gave her an injection last time which did not help.  I would like to get a MRI. I am concerned about a rotator cuff tear.  I will try to get her into physical therapy also. HPI  Body mass index is 26.07 kg/m.  ROS  Review of Systems  HENT: Negative for congestion.   Respiratory: Positive for shortness of breath. Negative for cough.   Cardiovascular: Negative for chest pain and leg swelling.  Endocrine: Positive for cold intolerance.  Musculoskeletal: Positive for arthralgias.  Allergic/Immunologic: Positive for environmental allergies.  Psychiatric/Behavioral: The patient is nervous/anxious.   All other systems reviewed and are negative.   Past Medical History:  Diagnosis Date  . Allergy   . Anemia   . Anxiety   . Arthritis   . Asthma   . COPD (chronic obstructive pulmonary disease) ([place])   . Depression   . Fibromyalgia   . Gastric ulcer   . GERD (gastroesophageal reflux disease)   . Hypertension   . Neuropathy   . Stress incontinence   . Tachycardia     Past Surgical History:  Procedure Laterality Date  . BIOPSY  03/01/2016   Procedure: BIOPSY;  Surgeon: Danie Binder, MD;  [facility];  Service: Endoscopy;;  gastric bx's  . COLONOSCOPY     Dr. Doristine Mango: normal. next TCS 10 years  . [facility]  [facility]  . ESOPHAGOGASTRODUODENOSCOPY (EGD) WITH PROPOFOL N/A 03/01/2016   Dr.  Oneida Alar: Upper GI bleed secondary to large gastric ulcer. Biopsies benign, no H pylori  . ESOPHAGOGASTRODUODENOSCOPY (EGD) WITH PROPOFOL N/A 05/30/2016   Procedure: ESOPHAGOGASTRODUODENOSCOPY (EGD) WITH PROPOFOL;  Surgeon: Daneil Dolin, MD;  [facility];  Service: Endoscopy;  Laterality: N/A;  8:15am  . INCONTINENCE SURGERY    . KNEE ARTHROSCOPY WITH MEDIAL MENISECTOMY Right 07/14/2016   Procedure: KNEE ARTHROSCOPY WITH  LATERAL MENISECTOMY;  Surgeon: Carole Civil, MD;  Location: [facility];  Service: Orthopedics;  Laterality: Right;  . MALONEY DILATION N/A 05/30/2016   Procedure: Venia Minks DILATION;  Surgeon: Daneil Dolin, MD;  [facility];  Service: Endoscopy;  Laterality: N/A;  . SPLENECTOMY, PARTIAL     [facility]  . TONSILLECTOMY     child  . TUBAL LIGATION      Family History  Problem Relation Age of Onset  . Alcohol abuse Mother   . Asthma Mother   . Heart disease Father   . Alcohol abuse Father   . Early death Sister   . Early death Brother        suicide  . Anesthesia problems Neg Hx   . Hypotension Neg Hx   . Malignant hyperthermia Neg Hx   . Pseudochol deficiency Neg Hx   . Colon cancer Neg Hx  Social History Social History   Tobacco Use  . Smoking status: Current Every Day Smoker    Packs/day: 1.00    Years: 40.00    Pack years: 40.00    Types: Cigarettes    Start date: 02/08/1968  . Smokeless tobacco: Never Used  Substance Use Topics  . Alcohol use: No  . Drug use: No    Allergies  Allergen Reactions  . Codeine Rash    Current Outpatient Medications  Medication Sig Dispense Refill  . albuterol (PROVENTIL HFA;VENTOLIN HFA) 108 (90 Base) MCG/ACT inhaler Inhale 1-2 puffs into the lungs every 6 (six) hours as needed for wheezing or shortness of breath.    . ALPRAZolam (XANAX) 1 MG tablet Take 1 mg by mouth 4 (four) times daily.      . cholecalciferol (VITAMIN D) 1000 units tablet Take  1,000 Units by mouth daily.    . citalopram (CELEXA) 40 MG tablet Take 40 mg by mouth daily.      Marland Kitchen desmopressin (DDAVP) 0.2 MG tablet Take 0.2 mg by mouth at bedtime.     Marland Kitchen doxycycline (VIBRAMYCIN) 100 MG capsule Take 1 capsule (100 mg total) by mouth 2 (two) times daily. One po bid x 7 days 14 capsule 0  . DULoxetine (CYMBALTA) 30 MG capsule Take 30 mg by mouth every morning.      . ferrous sulfate 325 (65 FE) MG tablet Take 325 mg by mouth daily with breakfast.    . fluticasone (FLONASE) 50 MCG/ACT nasal spray Place 1 spray into both nostrils daily.    Marland Kitchen gabapentin (NEURONTIN) 800 MG tablet Take 800 mg by mouth 4 (four) times daily.     . methocarbamol (ROBAXIN) 500 MG tablet Take 500-1,000 mg by mouth 3 (three) times daily.     . metoprolol succinate (TOPROL-XL) 100 MG 24 hr tablet Take 50 mg by mouth 2 (two) times daily.     . Multiple Vitamin (MULTIVITAMIN WITH MINERALS) TABS tablet Take 1 tablet by mouth daily.    Marland Kitchen oxyCODONE-acetaminophen (PERCOCET) 10-325 MG tablet Take 1 tablet by mouth every 4 (four) hours as needed for pain. (Patient taking differently: Take 0.5-1 tablets by mouth every 4 (four) hours as needed for pain. ) 42 tablet 0  . pantoprazole (PROTONIX) 40 MG tablet TAKE (1) TABLET TWICE A DAY BEFORE MEALS. 60 tablet 0  . tiZANidine (ZANAFLEX) 2 MG tablet Take 2 mg by mouth daily as needed for muscle spasms.     . traMADol (ULTRAM) 50 MG tablet Take 50-100 mg by mouth every 6 (six) hours as needed for moderate pain.     . vitamin B-12 (CYANOCOBALAMIN) 1000 MCG tablet Take 1,000 mcg by mouth daily.    . vitamin C (ASCORBIC ACID) 500 MG tablet Take 1,000 mg by mouth daily.     No current facility-administered medications for this visit.      Physical Exam  Blood pressure (!) 143/87, pulse 98, temperature 97.7 F (36.5 C), height 5\' 1"  (1.549 m), weight 138 lb (62.6 kg).  Constitutional: overall normal hygiene, normal nutrition, well developed, normal grooming, normal body  habitus. Assistive device:none  Musculoskeletal: gait and station Limp none, muscle tone and strength are normal, no tremors or atrophy is present.  .  Neurological: coordination overall normal.  Deep tendon reflex/nerve stretch intact.  Sensation normal.  Cranial nerves II-XII intact.   Skin:   Normal overall no scars, lesions, ulcers or rashes. No psoriasis.  Psychiatric: Alert and oriented x 3.  Recent memory intact, remote memory unclear.  Normal mood and affect. Well groomed.  Good eye contact.  Cardiovascular: overall no swelling, no varicosities, no edema bilaterally, normal temperatures of the legs and arms, no clubbing, cyanosis and good capillary refill.  Lymphatic: palpation is normal.  All other systems reviewed and are negative   Examination of right Upper Extremity is done.  Inspection:   Overall:  Elbow non-tender without crepitus or defects, forearm non-tender without crepitus or defects, wrist non-tender without crepitus or defects, hand non-tender.    Shoulder: with glenohumeral joint tenderness, without effusion.   Upper arm: without swelling and tenderness   Range of motion:   Overall:  Full range of motion of the elbow, full range of motion of wrist and full range of motion in fingers.   Shoulder:  right  120 degrees forward flexion; 90 degrees abduction; 30 degrees internal rotation, 30 degrees external rotation, 10 degrees extension, 40 degrees adduction.   Stability:   Overall:  Shoulder, elbow and wrist stable   Strength and Tone:   Overall full shoulder muscles strength, full upper arm strength and normal upper arm bulk and tone. The patient has been educated about the nature of the problem(s) and counseled on treatment options.  The patient appeared to understand what I have discussed and is in agreement with it.  Encounter Diagnosis  Name Primary?  . Acute pain of right shoulder Yes    PLAN Call if any problems.  Precautions discussed.  Continue  current medications.   Return to clinic after MRI of the right shoulder   Electronically Signed Sanjuana Kava, MD 12/4/20183:30 PM

## 2017-01-10 NOTE — Patient Instructions (Signed)
Coping with Quitting Smoking Quitting smoking is a physical and mental challenge. You will face cravings, withdrawal symptoms, and temptation. Before quitting, work with your health care provider to make a plan that can help you cope. Preparation can help you quit and keep you from giving in. How can I cope with cravings? Cravings usually last for 5-10 minutes. If you get through it, the craving will pass. Consider taking the following actions to help you cope with cravings:  Keep your mouth busy: ? Chew sugar-free gum. ? Suck on hard candies or a straw. ? Brush your teeth.  Keep your hands and body busy: ? Immediately change to a different activity when you feel a craving. ? Squeeze or play with a ball. ? Do an activity or a hobby, like making bead jewelry, practicing needlepoint, or working with wood. ? Mix up your normal routine. ? Take a short exercise break. Go for a quick walk or run up and down stairs. ? Spend time in public places where smoking is not allowed.  Focus on doing something kind or helpful for someone else.  Call a friend or family member to talk during a craving.  Join a support group.  Call a quit line, such as 1-800-QUIT-NOW.  Talk with your health care provider about medicines that might help you cope with cravings and make quitting easier for you.  How can I deal with withdrawal symptoms? Your body may experience negative effects as it tries to get used to not having nicotine in the system. These effects are called withdrawal symptoms. They may include:  Feeling hungrier than normal.  Trouble concentrating.  Irritability.  Trouble sleeping.  Feeling depressed.  Restlessness and agitation.  Craving a cigarette.  To manage withdrawal symptoms:  Avoid places, people, and activities that trigger your cravings.  Remember why you want to quit.  Get plenty of sleep.  Avoid coffee and other caffeinated drinks. These may worsen some of your  symptoms.  How can I handle social situations? Social situations can be difficult when you are quitting smoking, especially in the first few weeks. To manage this, you can:  Avoid parties, bars, and other social situations where people might be smoking.  Avoid alcohol.  Leave right away if you have the urge to smoke.  Explain to your family and friends that you are quitting smoking. Ask for understanding and support.  Plan activities with friends or family where smoking is not an option.  What are some ways I can cope with stress? Wanting to smoke may cause stress, and stress can make you want to smoke. Find ways to manage your stress. Relaxation techniques can help. For example:  Breathe slowly and deeply, in through your nose and out through your mouth.  Listen to soothing, relaxing music.  Talk with a family member or friend about your stress.  Light a candle.  Soak in a bath or take a shower.  Think about a peaceful place.  What are some ways I can prevent weight gain? Be aware that many people gain weight after they quit smoking. However, not everyone does. To keep from gaining weight, have a plan in place before you quit and stick to the plan after you quit. Your plan should include:  Having healthy snacks. When you have a craving, it may help to: ? Eat plain popcorn, crunchy carrots, celery, or other cut vegetables. ? Chew sugar-free gum.  Changing how you eat: ? Eat small portion sizes at meals. ?   Eat 4-6 small meals throughout the day instead of 1-2 large meals a day. ? Be mindful when you eat. Do not watch television or do other things that might distract you as you eat.  Exercising regularly: ? Make time to exercise each day. If you do not have time for a long workout, do short bouts of exercise for 5-10 minutes several times a day. ? Do some form of strengthening exercise, like weight lifting, and some form of aerobic exercise, like running or  swimming.  Drinking plenty of water or other low-calorie or no-calorie drinks. Drink 6-8 glasses of water daily, or as much as instructed by your health care provider.  Summary  Quitting smoking is a physical and mental challenge. You will face cravings, withdrawal symptoms, and temptation to smoke again. Preparation can help you as you go through these challenges.  You can cope with cravings by keeping your mouth busy (such as by chewing gum), keeping your body and hands busy, and making calls to family, friends, or a helpline for people who want to quit smoking.  You can cope with withdrawal symptoms by avoiding places where people smoke, avoiding drinks with caffeine, and getting plenty of rest.  Ask your health care provider about the different ways to prevent weight gain, avoid stress, and handle social situations. This information is not intended to replace advice given to you by your health care provider. Make sure you discuss any questions you have with your health care provider. Document Released: 01/22/2016 Document Revised: 01/22/2016 Document Reviewed: 01/22/2016 Elsevier Interactive Patient Education  2018 [employer]  

## 2017-01-11 ENCOUNTER — Ambulatory Visit: Payer: PPO | Admitting: Gastroenterology

## 2017-01-17 ENCOUNTER — Telehealth: Payer: Self-pay | Admitting: Orthopaedic Surgery

## 2017-01-17 NOTE — Telephone Encounter (Signed)
Patient called and stated that she got an approval for MRI sent to her from [facility] on Friday, 01-13-17.  She wants you to go ahead and set up MRI and return appointment.  Thanks

## 2017-01-18 NOTE — Telephone Encounter (Signed)
This was scheduled and the patient called with the apt date and times.

## 2017-01-23 ENCOUNTER — Telehealth: Payer: Self-pay | Admitting: Radiology

## 2017-01-23 ENCOUNTER — Other Ambulatory Visit: Payer: Self-pay | Admitting: Orthopedic Surgery

## 2017-01-23 NOTE — Telephone Encounter (Signed)
Glynda left 2 messages for patient last week about MRI and appt with Dr Luna Glasgow. I called her and she is aware of the appt times and states she will go.

## 2017-01-24 ENCOUNTER — Ambulatory Visit (HOSPITAL_COMMUNITY)
Admission: RE | Admit: 2017-01-24 | Discharge: 2017-01-24 | Disposition: A | Discharge status: Home / Self Care | Payer: PPO | Source: Ambulatory Visit | Attending: Orthopaedic Surgery | Admitting: Orthopaedic Surgery

## 2017-01-24 DIAGNOSIS — M25511 Pain in right shoulder: Secondary | ICD-10-CM

## 2017-01-24 DIAGNOSIS — M19011 Primary osteoarthritis, right shoulder: Secondary | ICD-10-CM | POA: Diagnosis not present

## 2017-01-24 DIAGNOSIS — M62511 Muscle wasting and atrophy, not elsewhere classified, right shoulder: Secondary | ICD-10-CM | POA: Insufficient documentation

## 2017-01-24 DIAGNOSIS — M25411 Effusion, right shoulder: Secondary | ICD-10-CM | POA: Insufficient documentation

## 2017-01-24 DIAGNOSIS — M75101 Unspecified rotator cuff tear or rupture of right shoulder, not specified as traumatic: Secondary | ICD-10-CM | POA: Insufficient documentation

## 2017-01-24 DIAGNOSIS — M84411K Pathological fracture, right shoulder, subsequent encounter for fracture with nonunion: Secondary | ICD-10-CM | POA: Diagnosis not present

## 2017-01-26 ENCOUNTER — Ambulatory Visit (INDEPENDENT_AMBULATORY_CARE_PROVIDER_SITE_OTHER): Payer: PPO | Admitting: Orthopaedic Surgery

## 2017-01-26 ENCOUNTER — Encounter: Payer: Self-pay | Admitting: Orthopaedic Surgery

## 2017-01-26 VITALS — BP 127/85 | HR 65 | Temp 97.7°F | Ht 61.0 in | Wt 140.0 lb

## 2017-01-26 DIAGNOSIS — M75121 Complete rotator cuff tear or rupture of right shoulder, not specified as traumatic: Secondary | ICD-10-CM

## 2017-01-26 NOTE — Patient Instructions (Addendum)
Smoking Tobacco Information Smoking tobacco will very likely harm your health. Tobacco contains a poisonous (toxic), colorless chemical called nicotine. Nicotine affects the brain and makes tobacco addictive. This change in your brain can make it hard to stop smoking. Tobacco also has other toxic chemicals that can hurt your body and raise your risk of many cancers. How can smoking tobacco affect me? Smoking tobacco can increase your chances of having serious health conditions, such as:  Cancer. Smoking is most commonly associated with lung cancer, but can lead to cancer in other parts of the body.  Chronic obstructive pulmonary disease (COPD). This is a long-term lung condition that makes it hard to breathe. It also gets worse over time.  High blood pressure (hypertension), heart disease, stroke, or heart attack.  Lung infections, such as pneumonia.  Cataracts. This is when the lenses in the eyes become clouded.  Digestive problems. This may include peptic ulcers, heartburn, and gastroesophageal reflux disease (GERD).  Oral health problems, such as gum disease and tooth loss.  Loss of taste and smell.  Smoking can affect your appearance by causing:  Wrinkles.  Yellow or stained teeth, fingers, and fingernails.  Smoking tobacco can also affect your social life.  Many workplaces, restaurants, hotels, and public places are tobacco-free. This means that you may experience challenges in finding places to smoke when away from home.  The cost of a smoking habit can be expensive. Expenses for someone who smokes come in two ways: ? You spend money on a regular basis to buy tobacco. ? Your health care costs in the long-term are higher if you smoke.  Tobacco smoke can also affect the health of those around you. Children of smokers have greater chances of: ? Sudden infant death syndrome (SIDS). ? Ear infections. ? Lung infections.  What lifestyle changes can be made?  Do not start  smoking. Quit if you already do.  To quit smoking: ? Make a plan to quit smoking and commit yourself to it. Look for programs to help you and ask your health care provider for recommendations and ideas. ? Talk with your health care provider about using nicotine replacement medicines to help you quit. Medicine replacement medicines include gum, lozenges, patches, sprays, or pills. ? Do not replace cigarette smoking with electronic cigarettes, which are commonly called e-cigarettes. The safety of e-cigarettes is not known, and some may contain harmful chemicals. ? Avoid places, people, or situations that tempt you to smoke. ? If you try to quit but return to smoking, don't give up hope. It is very common for people to try a number of times before they fully succeed. When you feel ready again, give it another try.  Quitting smoking might affect the way you eat as well as your weight. Be prepared to monitor your eating habits. Get support in planning and following a healthy diet.  Ask your health care provider about having regular tests (screenings) to check for cancer. This may include blood tests, imaging tests, and other tests.  Exercise regularly. Consider taking walks, joining a gym, or doing yoga or exercise classes.  Develop skills to manage your stress. These skills include meditation. What are the benefits of quitting smoking? By quitting smoking, you may:  Lower your risk of getting cancer and other diseases caused by smoking.  Live longer.  Breathe better.  Lower your blood pressure and heart rate.  Stop your addiction to tobacco.  Stop creating secondhand smoke that hurts other people.  Improve your   sense of taste and smell.  Look better over time, due to having fewer wrinkles and less staining.  What can happen if changes are not made? If you do not stop smoking, you may:  Get cancer and other diseases.  Develop COPD or other long-term (chronic) lung  conditions.  Develop serious problems with your heart and blood vessels (cardiovascular system).  Need more tests to screen for problems caused by smoking.  Have higher, long-term healthcare costs from medicines or treatments related to smoking.  Continue to have worsening changes in your lungs, mouth, and nose.  Where to find support: To get support to quit smoking, consider:  Asking your health care provider for more information and resources.  Taking classes to learn more about quitting smoking.  Looking for local organizations that offer resources about quitting smoking.  Joining a support group for people who want to quit smoking in your local community.  Where to find more information: You may find more information about quitting smoking from:  HelpGuide.org: www.helpguide.org/articles/addictions/how-to-quit-smoking.htm  https://hall.com/: smokefree.gov  American Lung Association: www.lung.org  Contact a health care provider if:  You have problems breathing.  Your lips, nose, or fingers turn blue.  You have chest pain.  You are coughing up blood.  You feel faint or you pass out.  You have other noticeable changes that cause you to worry. Summary  Smoking tobacco can negatively affect your health, the health of those around you, your finances, and your social life.  Do not start smoking. Quit if you already do. If you need help quitting, ask your health care provider.  Think about joining a support group for people who want to quit smoking in your local community. There are many effective programs that will help you to quit this behavior. This information is not intended to replace advice given to you by your health care provider. Make sure you discuss any questions you have with your health care provider. Document Released: 02/09/2016 Document Revised: 02/09/2016 Document Reviewed: 02/09/2016 Elsevier Interactive Patient Education  2018 [employer].  Steps to  Quit Smoking Smoking tobacco can be bad for your health. It can also affect almost every organ in your body. Smoking puts you and people around you at risk for many serious long-lasting (chronic) diseases. Quitting smoking is hard, but it is one of the best things that you can do for your health. It is never too late to quit. What are the benefits of quitting smoking? When you quit smoking, you lower your risk for getting serious diseases and conditions. They can include:  Lung cancer or lung disease.  Heart disease.  Stroke.  Heart attack.  Not being able to have children (infertility).  Weak bones (osteoporosis) and broken bones (fractures).  If you have coughing, wheezing, and shortness of breath, those symptoms may get better when you quit. You may also get sick less often. If you are pregnant, quitting smoking can help to lower your chances of having a baby of low birth weight. What can I do to help me quit smoking? Talk with your doctor about what can help you quit smoking. Some things you can do (strategies) include:  Quitting smoking totally, instead of slowly cutting back how much you smoke over a period of time.  Going to in-person counseling. You are more likely to quit if you go to many counseling sessions.  Using resources and support systems, such as: ? Database administrator with a Social worker. ? Phone quitlines. ? Careers information officer. ?  Support groups or group counseling. ? Text messaging programs. ? Mobile phone apps or applications.  Taking medicines. Some of these medicines may have nicotine in them. If you are pregnant or breastfeeding, do not take any medicines to quit smoking unless your doctor says it is okay. Talk with your doctor about counseling or other things that can help you.  Talk with your doctor about using more than one strategy at the same time, such as taking medicines while you are also going to in-person counseling. This can help make quitting  easier. What things can I do to make it easier to quit? Quitting smoking might feel very hard at first, but there is a lot that you can do to make it easier. Take these steps:  Talk to your family and friends. Ask them to support and encourage you.  Call phone quitlines, reach out to support groups, or work with a Social worker.  Ask people who smoke to not smoke around you.  Avoid places that make you want (trigger) to smoke, such as: ? Bars. ? Parties. ? Smoke-break areas at work.  Spend time with people who do not smoke.  Lower the stress in your life. Stress can make you want to smoke. Try these things to help your stress: ? Getting regular exercise. ? Deep-breathing exercises. ? Yoga. ? Meditating. ? Doing a body scan. To do this, close your eyes, focus on one area of your body at a time from head to toe, and notice which parts of your body are tense. Try to relax the muscles in those areas.  Download or buy apps on your mobile phone or tablet that can help you stick to your quit plan. There are many free apps, such as QuitGuide from the [employer] Office manager for Disease Control and Prevention). You can find more support from smokefree.gov and other websites.  This information is not intended to replace advice given to you by your health care provider. Make sure you discuss any questions you have with your health care provider. Document Released: 11/20/2008 Document Revised: 09/22/2015 Document Reviewed: 06/10/2014 Elsevier Interactive Patient Education  2018 [employer].

## 2017-01-26 NOTE — Progress Notes (Signed)
Patient Brooke Deleon, female DOB:April 09, 1955, 61 y.o. AVW:[id number]  Chief Complaint  Patient presents with  . Shoulder Pain    RIGHT    HPI  Brooke Deleon is a 61 y.o. female who has continued pain of the right shoulder.  She had MRI done and it showed: IMPRESSION: 1. Large full-thickness retracted tears of the infraspinatus and supraspinatus tendons with marked atrophy of those muscles. 2. Extensive partial-thickness tear of the bursal surface of the distal subscapularis with a full-thickness tear of the superior aspect of the subscapularis. Marked atrophy of the subscapularis muscle. 3. Osteoarthritic changes of the glenohumeral joint with chronic superior subluxation of the humeral head. 4. 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 5. Large glenohumeral joint effusion.  I have explained the findings to her.  I will have her see Dr. Aline Brochure to see if she is a surgical candidate.  She is agreeable to this. HPI  Body mass index is 26.45 kg/m.  ROS  Review of Systems  HENT: Negative for congestion.   Respiratory: Positive for shortness of breath. Negative for cough.   Cardiovascular: Negative for chest pain and leg swelling.  Endocrine: Positive for cold intolerance.  Musculoskeletal: Positive for arthralgias.  Allergic/Immunologic: Positive for environmental allergies.  Psychiatric/Behavioral: The patient is nervous/anxious.   All other systems reviewed and are negative.   Past Medical History:  Diagnosis Date  . Allergy   . Anemia   . Anxiety   . Arthritis   . Asthma   . COPD (chronic obstructive pulmonary disease) ([place])   . Depression   . Fibromyalgia   . Gastric ulcer   . GERD (gastroesophageal reflux disease)   . Hypertension   . Neuropathy   . Stress incontinence   . Tachycardia     Past Surgical History:  Procedure Laterality Date  . BIOPSY  03/01/2016   Procedure: BIOPSY;  Surgeon: Danie Binder, MD;  Location: [facility];   Service: Endoscopy;;  gastric bx's  . COLONOSCOPY     Dr. Doristine Mango: normal. next TCS 10 years  . [facility]  [facility]  [facility] ESOPHAGOGASTRODUODENOSCOPY (EGD) WITH PROPOFOL N/A 03/01/2016   Dr. Oneida Alar: Upper GI bleed secondary to large gastric ulcer. Biopsies benign, no H pylori  . ESOPHAGOGASTRODUODENOSCOPY (EGD) WITH PROPOFOL N/A 05/30/2016   Procedure: ESOPHAGOGASTRODUODENOSCOPY (EGD) WITH PROPOFOL;  Surgeon: Daneil Dolin, MD;  Location: [facility];  Service: Endoscopy;  Laterality: N/A;  8:15am  . INCONTINENCE SURGERY    . KNEE ARTHROSCOPY WITH MEDIAL MENISECTOMY Right 07/14/2016   Procedure: KNEE ARTHROSCOPY WITH  LATERAL MENISECTOMY;  Surgeon: Carole Civil, MD;  Location: [facility];  Service: Orthopedics;  Laterality: Right;  . MALONEY DILATION N/A 05/30/2016   Procedure: Venia Minks DILATION;  Surgeon: Daneil Dolin, MD;  Location: [facility];  Service: Endoscopy;  Laterality: N/A;  . SPLENECTOMY, PARTIAL     [facility] due [facility] M[facility]  . TONSILLECTOMY     child  . TUBAL LIGATION      Family History  Problem Relation Age of Onset  . Alcohol abuse Mother   . Asthma Mother   . Heart disease Father   . Alcohol abuse Father   . Early death Sister   . Early death Brother        suicide  . Anesthesia problems Neg Hx   . Hypotension Neg Hx   . Malignant hyperthermia Neg Hx   .  Pseudochol deficiency Neg Hx   . Colon cancer Neg Hx     Social History Social History   Tobacco Use  . Smoking status: Current Every Day Smoker    Packs/day: 1.00    Years: 40.00    Pack years: 40.00    Types: Cigarettes    Start date: 02/08/1968  . Smokeless tobacco: Never Used  Substance Use Topics  . Alcohol use: No  . Drug use: No    Allergies  Allergen Reactions  . Codeine Rash    Current Outpatient Medications  Medication Sig Dispense Refill  . albuterol (PROVENTIL HFA;VENTOLIN HFA) 108 (90 Base) MCG/ACT  inhaler Inhale 1-2 puffs into the lungs every 6 (six) hours as needed for wheezing or shortness of breath.    . ALPRAZolam (XANAX) 1 MG tablet Take 1 mg by mouth 4 (four) times daily.      . cholecalciferol (VITAMIN D) 1000 units tablet Take 1,000 Units by mouth daily.    . citalopram (CELEXA) 40 MG tablet Take 40 mg by mouth daily.      Marland Kitchen desmopressin (DDAVP) 0.2 MG tablet Take 0.2 mg by mouth at bedtime.     Marland Kitchen doxycycline (VIBRAMYCIN) 100 MG capsule Take 1 capsule (100 mg total) by mouth 2 (two) times daily. One po bid x 7 days 14 capsule 0  . DULoxetine (CYMBALTA) 30 MG capsule Take 30 mg by mouth every morning.      . ferrous sulfate 325 (65 FE) MG tablet Take 325 mg by mouth daily with breakfast.    . fluticasone (FLONASE) 50 MCG/ACT nasal spray Place 1 spray into both nostrils daily.    Marland Kitchen gabapentin (NEURONTIN) 800 MG tablet Take 800 mg by mouth 4 (four) times daily.     . methocarbamol (ROBAXIN) 500 MG tablet Take 500-1,000 mg by mouth 3 (three) times daily.     . metoprolol succinate (TOPROL-XL) 100 MG 24 hr tablet Take 50 mg by mouth 2 (two) times daily.     . Multiple Vitamin (MULTIVITAMIN WITH MINERALS) TABS tablet Take 1 tablet by mouth daily.    Marland Kitchen oxyCODONE-acetaminophen (PERCOCET) 10-325 MG tablet Take 1 tablet by mouth every 4 (four) hours as needed for pain. (Patient taking differently: Take 0.5-1 tablets by mouth every 4 (four) hours as needed for pain. ) 42 tablet 0  . pantoprazole (PROTONIX) 40 MG tablet TAKE (1) TABLET TWICE A DAY BEFORE MEALS. 60 tablet 0  . tiZANidine (ZANAFLEX) 2 MG tablet Take 2 mg by mouth daily as needed for muscle spasms.     . traMADol (ULTRAM) 50 MG tablet Take 50-100 mg by mouth every 6 (six) hours as needed for moderate pain.     . vitamin B-12 (CYANOCOBALAMIN) 1000 MCG tablet Take 1,000 mcg by mouth daily.    . vitamin C (ASCORBIC ACID) 500 MG tablet Take 1,000 mg by mouth daily.     No current facility-administered medications for this visit.       Physical Exam  Blood pressure 127/85, pulse 65, temperature 97.7 F (36.5 C), height 5\' 1"  (1.549 m), weight 140 lb (63.5 kg).  Constitutional: overall normal hygiene, normal nutrition, well developed, normal grooming, normal body habitus. Assistive device:none  Musculoskeletal: gait and station Limp none, muscle tone and strength are normal, no tremors or atrophy is present.  .  Neurological: coordination overall normal.  Deep tendon reflex/nerve stretch intact.  Sensation normal.  Cranial nerves II-XII intact.   Skin:   Normal overall no scars,  lesions, ulcers or rashes. No psoriasis.  Psychiatric: Alert and oriented x 3.  Recent memory intact, remote memory unclear.  Normal mood and affect. Well groomed.  Good eye contact.  Cardiovascular: overall no swelling, no varicosities, no edema bilaterally, normal temperatures of the legs and arms, no clubbing, cyanosis and good capillary refill.  Lymphatic: palpation is normal.  All other systems reviewed and are negative   Right shoulder is painful to move past 90 degrees forward flexion and abduction.  NV intact. There is an effusion of the shoulder today.  The patient has been educated about the nature of the problem(s) and counseled on treatment options.  The patient appeared to understand what I have discussed and is in agreement with it.  Encounter Diagnosis  Name Primary?  . Complete tear of right rotator cuff Yes    PLAN Call if any problems.  Precautions discussed.  Continue current medications.   Return to clinic See Dr. Aline Brochure for possible surgery on the right shoulder.   Electronically Signed Sanjuana Kava, MD 12/20/20183:10 PM

## 2017-02-20 ENCOUNTER — Other Ambulatory Visit: Payer: Self-pay | Admitting: Orthopedic Surgery

## 2017-02-22 ENCOUNTER — Ambulatory Visit: Payer: PPO | Admitting: Orthopedic Surgery

## 2017-02-22 ENCOUNTER — Encounter: Payer: Self-pay | Admitting: Orthopedic Surgery

## 2017-02-22 VITALS — BP 166/107 | HR 69 | Ht 64.0 in | Wt 140.0 lb

## 2017-02-22 DIAGNOSIS — M12811 Other specific arthropathies, not elsewhere classified, right shoulder: Secondary | ICD-10-CM

## 2017-02-22 NOTE — Progress Notes (Signed)
Progress Note   Patient ID: Brooke Deleon, female   DOB: [birth year], 62 y.o.   MRN: [id number]  Chief Complaint  Patient presents with  . Shoulder Pain    surgical consult [per Dr Luna Glasgow    62 year old female with long-standing pain in her right shoulder which started for her after she fell and injured the right shoulder.  She is referred to me for possible rotator cuff repair  She complains of decreased and painful range of motion with a 10 out of 10 pain in her right shoulder which is relieved by leaving the arm still and not moving the shoulder this is associated with some weakness and difficulty with activities of daily living such as vacuuming and doing her laundry.       Review of Systems  Constitutional: Negative for chills and fever.  Respiratory: Positive for cough. Negative for shortness of breath.   Cardiovascular: Negative for chest pain.  Musculoskeletal:       Chronic muscle ache and pain  Skin: Negative.    Current Meds  Medication Sig  . albuterol (PROVENTIL HFA;VENTOLIN HFA) 108 (90 Base) MCG/ACT inhaler Inhale 1-2 puffs into the lungs every 6 (six) hours as needed for wheezing or shortness of breath.  . ALPRAZolam (XANAX) 1 MG tablet Take 1 mg by mouth 4 (four) times daily.    . cholecalciferol (VITAMIN D) 1000 units tablet Take 1,000 Units by mouth daily.  . citalopram (CELEXA) 40 MG tablet Take 40 mg by mouth daily.    Marland Kitchen desmopressin (DDAVP) 0.2 MG tablet Take 0.2 mg by mouth at bedtime.   . DULoxetine (CYMBALTA) 30 MG capsule Take 30 mg by mouth every morning.    . ferrous sulfate 325 (65 FE) MG tablet Take 325 mg by mouth daily with breakfast.  . fluticasone (FLONASE) 50 MCG/ACT nasal spray Place 1 spray into both nostrils daily.  Marland Kitchen gabapentin (NEURONTIN) 800 MG tablet Take 800 mg by mouth 4 (four) times daily.   . methocarbamol (ROBAXIN) 500 MG tablet Take 500-1,000 mg by mouth 3 (three) times daily.   . metoprolol succinate (TOPROL-XL) 100 MG 24 hr  tablet Take 50 mg by mouth 2 (two) times daily.   . Multiple Vitamin (MULTIVITAMIN WITH MINERALS) TABS tablet Take 1 tablet by mouth daily.  Marland Kitchen oxyCODONE-acetaminophen (PERCOCET) 10-325 MG tablet Take 1 tablet by mouth every 4 (four) hours as needed for pain. (Patient taking differently: Take 0.5-1 tablets by mouth every 4 (four) hours as needed for pain. )  . pantoprazole (PROTONIX) 40 MG tablet TAKE (1) TABLET TWICE A DAY BEFORE MEALS.  Marland Kitchen tiZANidine (ZANAFLEX) 2 MG tablet Take 2 mg by mouth daily as needed for muscle spasms.   . traMADol (ULTRAM) 50 MG tablet Take 50-100 mg by mouth every 6 (six) hours as needed for moderate pain.   . vitamin B-12 (CYANOCOBALAMIN) 1000 MCG tablet Take 1,000 mcg by mouth daily.  . vitamin C (ASCORBIC ACID) 500 MG tablet Take 1,000 mg by mouth daily.    Past Medical History:  Diagnosis Date  . Allergy   . Anemia   . Anxiety   . Arthritis   . Asthma   . COPD (chronic obstructive pulmonary disease) ([place])   . Depression   . Fibromyalgia   . Gastric ulcer   . GERD (gastroesophageal reflux disease)   . Hypertension   . Neuropathy   . Stress incontinence   . Tachycardia      Allergies  Allergen Reactions  .  Codeine Rash    BP (!) 166/107   Pulse 69   Ht 5\' 4"  (1.626 m)   Wt 140 lb (63.5 kg)   BMI 24.03 kg/m    Physical Exam  Constitutional: She is oriented to person, place, and time. She appears well-developed and well-nourished.  BP (!) 166/107   Pulse 69   Ht 5\' 4"  (1.626 m)   Wt 140 lb (63.5 kg)   BMI 24.03 kg/m    Neurological: She is alert and oriented to person, place, and time.  Psychiatric: She has a normal mood and affect. Judgment normal.  Vitals reviewed.   Ortho Exam Right shoulder exam tenderness in the front of the joint crepitance on range of motion her active and passive flexion is 120 degrees she has 40 degrees of external rotation with the arm at her side she has weakness in the rotator cuff on abduction and flexion  mild with external rotation none with internal rotation shoulder feels stable to stress testing.  Skin is normal.  Pulses are good lymph nodes are negative and sensation is intact  Medical decision-making  Imaging: Her MRI report and images do show supraspinatus infraspinatus and a portion of the subscapularis is torn she has glenohumeral arthritis  She has proximal migration of the humerus with articulation with the acromion   Encounter Diagnosis  Name Primary?  . Rotator cuff arthropathy of right shoulder Yes    She is not a candidate for a total shoulder or cuff repair the only thing that would improve her situation is a reverse shoulder prosthesis  I am making a referral to her to a shoulder specialist for evaluation and treatment to see if she is a candidate for such     Arther Abbott, MD 02/22/2017 2:09 PM

## 2017-02-22 NOTE — Patient Instructions (Signed)
Dr Tania Ade  [address] [place]

## 2017-03-01 ENCOUNTER — Telehealth: Payer: Self-pay | Admitting: Orthopedic Surgery

## 2017-03-01 NOTE — Telephone Encounter (Signed)
Brooke Deleon called stating you were trying to get her a referral to [facility].  She said she has spoken with them and they did not find a referral on her.   She said she was given a new fax number and she wanted to pass this along to you.  That fax number is [phone].  Please call her after you have faxed notes.  Thanks

## 2017-03-02 NOTE — Telephone Encounter (Signed)
I have refaxed to the number provided. Left message for patient to advise, All 3 faxes I have gotten confirmation they went through.

## 2017-03-06 DIAGNOSIS — Z79891 Long term (current) use of opiate analgesic: Secondary | ICD-10-CM | POA: Diagnosis not present

## 2017-03-06 DIAGNOSIS — M25519 Pain in unspecified shoulder: Secondary | ICD-10-CM | POA: Diagnosis not present

## 2017-03-06 DIAGNOSIS — M545 Low back pain: Secondary | ICD-10-CM | POA: Diagnosis not present

## 2017-03-06 DIAGNOSIS — M797 Fibromyalgia: Secondary | ICD-10-CM | POA: Diagnosis not present

## 2017-03-08 DIAGNOSIS — M19211 Secondary osteoarthritis, right shoulder: Secondary | ICD-10-CM | POA: Diagnosis not present

## 2017-03-23 ENCOUNTER — Other Ambulatory Visit: Payer: Self-pay | Admitting: Orthopedic Surgery

## 2017-03-31 DIAGNOSIS — I10 Essential (primary) hypertension: Secondary | ICD-10-CM | POA: Diagnosis not present

## 2017-03-31 DIAGNOSIS — G629 Polyneuropathy, unspecified: Secondary | ICD-10-CM | POA: Diagnosis not present

## 2017-03-31 DIAGNOSIS — Z299 Encounter for prophylactic measures, unspecified: Secondary | ICD-10-CM | POA: Diagnosis not present

## 2017-03-31 DIAGNOSIS — J069 Acute upper respiratory infection, unspecified: Secondary | ICD-10-CM | POA: Diagnosis not present

## 2017-03-31 DIAGNOSIS — F329 Major depressive disorder, single episode, unspecified: Secondary | ICD-10-CM | POA: Diagnosis not present

## 2017-03-31 DIAGNOSIS — Z6827 Body mass index (BMI) 27.0-27.9, adult: Secondary | ICD-10-CM | POA: Diagnosis not present

## 2017-03-31 DIAGNOSIS — Z01818 Encounter for other preprocedural examination: Secondary | ICD-10-CM | POA: Diagnosis not present

## 2017-04-04 DIAGNOSIS — Z1211 Encounter for screening for malignant neoplasm of colon: Secondary | ICD-10-CM | POA: Diagnosis not present

## 2017-04-04 DIAGNOSIS — Z79899 Other long term (current) drug therapy: Secondary | ICD-10-CM | POA: Diagnosis not present

## 2017-04-04 DIAGNOSIS — Z1331 Encounter for screening for depression: Secondary | ICD-10-CM | POA: Diagnosis not present

## 2017-04-04 DIAGNOSIS — I10 Essential (primary) hypertension: Secondary | ICD-10-CM | POA: Diagnosis not present

## 2017-04-04 DIAGNOSIS — J300 Vasomotor rhinitis: Secondary | ICD-10-CM | POA: Diagnosis not present

## 2017-04-04 DIAGNOSIS — Z1231 Encounter for screening mammogram for malignant neoplasm of breast: Secondary | ICD-10-CM | POA: Diagnosis not present

## 2017-04-04 DIAGNOSIS — R5383 Other fatigue: Secondary | ICD-10-CM | POA: Diagnosis not present

## 2017-04-04 DIAGNOSIS — Z6827 Body mass index (BMI) 27.0-27.9, adult: Secondary | ICD-10-CM | POA: Diagnosis not present

## 2017-04-04 DIAGNOSIS — Z299 Encounter for prophylactic measures, unspecified: Secondary | ICD-10-CM | POA: Diagnosis not present

## 2017-04-04 DIAGNOSIS — Z7189 Other specified counseling: Secondary | ICD-10-CM | POA: Diagnosis not present

## 2017-04-04 DIAGNOSIS — Z1339 Encounter for screening examination for other mental health and behavioral disorders: Secondary | ICD-10-CM | POA: Diagnosis not present

## 2017-04-04 DIAGNOSIS — Z0000 Encounter for general adult medical examination without abnormal findings: Secondary | ICD-10-CM | POA: Diagnosis not present

## 2017-04-05 ENCOUNTER — Ambulatory Visit: Payer: PPO | Admitting: Urology

## 2017-04-05 DIAGNOSIS — R3915 Urgency of urination: Secondary | ICD-10-CM

## 2017-04-05 DIAGNOSIS — D62 Acute posthemorrhagic anemia: Secondary | ICD-10-CM | POA: Diagnosis not present

## 2017-04-05 DIAGNOSIS — N3941 Urge incontinence: Secondary | ICD-10-CM

## 2017-04-05 DIAGNOSIS — M545 Low back pain: Secondary | ICD-10-CM | POA: Diagnosis not present

## 2017-04-05 DIAGNOSIS — M797 Fibromyalgia: Secondary | ICD-10-CM | POA: Diagnosis not present

## 2017-04-05 DIAGNOSIS — Z79891 Long term (current) use of opiate analgesic: Secondary | ICD-10-CM | POA: Diagnosis not present

## 2017-04-05 DIAGNOSIS — M25519 Pain in unspecified shoulder: Secondary | ICD-10-CM | POA: Diagnosis not present

## 2017-04-06 LAB — BASIC METABOLIC PANEL WITH GFR
BUN / CREAT RATIO: 8 (calc) (ref 6–22)
BUN: 8 mg/dL (ref 7–25)
CHLORIDE: 95 mmol/L — AB (ref 98–110)
CO2: 34 mmol/L — AB (ref 20–32)
Calcium: 9.5 mg/dL (ref 8.6–10.4)
Creat: 1.01 mg/dL — ABNORMAL HIGH (ref 0.50–0.99)
GFR, Est African American: 70 mL/min/{1.73_m2} (ref >=60)
GFR, Est Non African American: 60 mL/min/{1.73_m2} (ref >=60)
GLUCOSE: 71 mg/dL (ref 65–139)
POTASSIUM: 4.2 mmol/L (ref 3.5–5.3)
SODIUM: 134 mmol/L — AB (ref 135–146)

## 2017-04-08 NOTE — Progress Notes (Signed)
Not sure where or why this lab is coming from. I last saw her 1 year ago. At any rate stable findings.

## 2017-04-11 ENCOUNTER — Other Ambulatory Visit: Payer: Self-pay | Admitting: Orthopedic Surgery

## 2017-04-21 ENCOUNTER — Other Ambulatory Visit: Payer: Self-pay | Admitting: Orthopedic Surgery

## 2017-04-21 NOTE — Pre-Procedure Instructions (Signed)
Brooke Deleon  04/21/2017      [facility] - [address]le [address]ens, [address] [place] [address] Phone: [phone] Fax: [place], [place] - [address] [place] [address] Phone: [phone] Fax: [place], [place] [address] Phone: [phone] Fax: [phone]    Your procedure is scheduled on Thursday, March 21st, 2019.  Report to [facility] Admitting at 8:30 A.M.   Call this number if you have problems the morning of surgery:  [phone]   Remember:  Do not eat food or drink liquids after midnight.   Take these medicines the morning of surgery with A SIP OF WATER:  Albuterol Inhaler if needed,  Alprazolam (Xanax),  Citalopram (Celexa),  Duloxetine (Cymbalta),  Fluticasone (Flonase) if needed,  Gabapentin (Neurontin),  Methocarbamol (Robaxin),  Metoprolol Succinate (Toprol-XL), Mirabegron ER (Myrbetriq),  Oxycodone-Acetaminophen (Percocet) if needed,  Pantoprazole (Protonix),  Tizanidine (Zanaflex) if needed,  Tramadol (Ultram) if needed.    7 days prior to surgery, stop taking: NSAIDS, Aleve, Naproxen, Ibuprofen, Advil, Motrin, BC's, Goody's, Fish oil, all herbal medications, and all vitamins.     Do not wear jewelry, make-up or nail polish.  Do not wear lotions, powders, or perfumes, or deodorant.  Do not shave 48 hours prior to surgery.    Do not bring valuables to the hospital.  [facility] is not responsible for any belongings or valuables.  Contacts, dentures or bridgework may not be worn into surgery.  Leave your suitcase in the car.  After surgery it may be brought to your room.  For patients admitted to the hospital, discharge time will be determined by your treatment team.  Patients discharged the day of surgery will not be allowed to drive home.    Special instructions:     [facility]- Preparing For Surgery  Before surgery, you can play an important role. Because skin is not sterile, your skin needs to be as free of germs as possible. You can reduce the number of germs on your skin by washing with CHG (chlorahexidine gluconate) Soap before surgery.  CHG is an antiseptic cleaner which kills germs and bonds with the skin to continue killing germs even after washing.  Please do not use if you have an allergy to CHG or antibacterial soaps. If your skin becomes reddened/irritated stop using the CHG.  Do not shave (including legs and underarms) for at least 48 hours prior to first CHG shower. It is OK to shave your face.  Please follow these instructions carefully.   1. Shower the NIGHT BEFORE SURGERY and the MORNING OF SURGERY with CHG.   2. If you chose to wash your hair, wash your hair first as usual with your normal shampoo.  3. After you shampoo, rinse your hair and body thoroughly to remove the shampoo.  4. Use CHG as you would any other liquid soap. You can apply CHG directly to the skin and wash gently with a scrungie or a clean washcloth.   5. Apply the CHG Soap to your body ONLY FROM THE NECK DOWN.  Do not use on open wounds or open sores. Avoid contact with your eyes, ears, mouth and genitals (private parts). Wash Face and genitals (private parts)  with your normal soap.  6. Wash thoroughly,  paying special attention to the area where your surgery will be performed.  7. Thoroughly rinse your body with warm water from the neck down.  8. DO NOT shower/wash with your normal soap after using and rinsing off the CHG Soap.  9. Pat yourself dry with a CLEAN TOWEL.  10. Wear CLEAN PAJAMAS to bed the night before surgery, wear comfortable clothes the morning of surgery  11. Place CLEAN SHEETS on your bed the night of your first shower and DO NOT SLEEP WITH PETS.  Day of Surgery: Do not apply any deodorants/lotions. Please  wear clean clothes to the hospital/surgery center.      Please read over the following fact sheets that you were given. Pain Booklet, Coughing and Deep Breathing, Blood Transfusion Information, Total Joint Packet, MRSA Information and Surgical Site Infection Prevention

## 2017-04-24 ENCOUNTER — Inpatient Hospital Stay (HOSPITAL_COMMUNITY)
Admission: RE | Admit: 2017-04-24 | Discharge: 2017-04-24 | Disposition: A | Discharge status: Home / Self Care | Payer: PPO | Source: Ambulatory Visit

## 2017-04-24 ENCOUNTER — Other Ambulatory Visit: Payer: Self-pay

## 2017-04-24 ENCOUNTER — Encounter (HOSPITAL_COMMUNITY): Payer: Self-pay

## 2017-04-24 NOTE — Progress Notes (Addendum)
PCP - Dr. Woody Seller; clearance in chart  Chest x-ray - 10/09/2016 EKG - 10/09/2016 Patient denies any cardiac testing   Anesthesia review: yes, abnormal EKG  Patient was unable to attend her PAT appointment so I did a pre-op phone call.  Patient denies shortness of breath, fever, cough and chest pain.  Patient takes DDAVP for incontinence.  Patient verbalized understanding of instructions that were given to them.

## 2017-04-25 NOTE — Progress Notes (Signed)
Anesthesia Chart Review:  Pt is a same day work up  Pt is a 62 year old female scheduled for R reverse total shoulder arthroplasty on 04/27/2017 with Tania Ade, MD  - PCP is Jerene Bears, MD who cleared patient for surgery  PMH includes:  HTN, tachycardia, COPD, asthma, anemia, stress incontinence, GERD. Current smoker. BMI 24. S/p R knee arthroscopy 07/14/16  Medications include: Albuterol, DDAVP, iron, metoprolol, Protonix  Labs will be obtained day of surgery   CXR 10/09/16: No active cardiopulmonary disease.  EKG 10/09/16: Sinus rhythm.  Anteroseptal infarct, age undetermined.  Appears stable when compared to prior EKG 08/03/16.  If labs acceptable day of surgery, I anticipate pt can proceed with surgery as scheduled.   Willeen Cass, FNP-BC [facility] Short Stay Surgical Center/Anesthesiology Phone: [phone] 04/25/2017 9:54 AM

## 2017-04-26 MED ORDER — BUPIVACAINE LIPOSOME 1.3 % IJ SUSP
20.0000 mL | INTRAMUSCULAR | Status: DC
  Filled 2017-04-26: qty 20

## 2017-04-27 ENCOUNTER — Inpatient Hospital Stay (HOSPITAL_COMMUNITY): Payer: PPO | Admitting: Emergency Medicine

## 2017-04-27 ENCOUNTER — Encounter (HOSPITAL_COMMUNITY): Payer: Self-pay | Admitting: Certified Registered"

## 2017-04-27 ENCOUNTER — Inpatient Hospital Stay (HOSPITAL_COMMUNITY)
Admission: RE | Admit: 2017-04-27 | Discharge: 2017-04-28 | DRG: 483 | Disposition: A | Discharge status: Home / Self Care | Payer: PPO | Source: Ambulatory Visit | Attending: Orthopedic Surgery | Admitting: Orthopedic Surgery

## 2017-04-27 ENCOUNTER — Other Ambulatory Visit: Payer: Self-pay

## 2017-04-27 ENCOUNTER — Inpatient Hospital Stay (HOSPITAL_COMMUNITY): Payer: PPO

## 2017-04-27 ENCOUNTER — Encounter (HOSPITAL_COMMUNITY)
Admission: RE | Disposition: A | Discharge status: Home / Self Care | Payer: Self-pay | Source: Ambulatory Visit | Attending: Orthopedic Surgery

## 2017-04-27 DIAGNOSIS — Z96611 Presence of right artificial shoulder joint: Secondary | ICD-10-CM

## 2017-04-27 DIAGNOSIS — F17210 Nicotine dependence, cigarettes, uncomplicated: Secondary | ICD-10-CM | POA: Diagnosis present

## 2017-04-27 DIAGNOSIS — K219 Gastro-esophageal reflux disease without esophagitis: Secondary | ICD-10-CM | POA: Diagnosis present

## 2017-04-27 DIAGNOSIS — M25511 Pain in right shoulder: Secondary | ICD-10-CM | POA: Diagnosis present

## 2017-04-27 DIAGNOSIS — G629 Polyneuropathy, unspecified: Secondary | ICD-10-CM | POA: Diagnosis present

## 2017-04-27 DIAGNOSIS — M19011 Primary osteoarthritis, right shoulder: Secondary | ICD-10-CM | POA: Diagnosis not present

## 2017-04-27 DIAGNOSIS — I10 Essential (primary) hypertension: Secondary | ICD-10-CM | POA: Diagnosis not present

## 2017-04-27 DIAGNOSIS — I739 Peripheral vascular disease, unspecified: Secondary | ICD-10-CM | POA: Diagnosis present

## 2017-04-27 DIAGNOSIS — Z8249 Family history of ischemic heart disease and other diseases of the circulatory system: Secondary | ICD-10-CM

## 2017-04-27 DIAGNOSIS — Z9081 Acquired absence of spleen: Secondary | ICD-10-CM

## 2017-04-27 DIAGNOSIS — Z471 Aftercare following joint replacement surgery: Secondary | ICD-10-CM | POA: Diagnosis not present

## 2017-04-27 DIAGNOSIS — J449 Chronic obstructive pulmonary disease, unspecified: Secondary | ICD-10-CM | POA: Diagnosis present

## 2017-04-27 DIAGNOSIS — M75101 Unspecified rotator cuff tear or rupture of right shoulder, not specified as traumatic: Secondary | ICD-10-CM | POA: Diagnosis present

## 2017-04-27 DIAGNOSIS — M797 Fibromyalgia: Secondary | ICD-10-CM | POA: Diagnosis not present

## 2017-04-27 DIAGNOSIS — Z6827 Body mass index (BMI) 27.0-27.9, adult: Secondary | ICD-10-CM

## 2017-04-27 DIAGNOSIS — G8918 Other acute postprocedural pain: Secondary | ICD-10-CM | POA: Diagnosis not present

## 2017-04-27 DIAGNOSIS — E669 Obesity, unspecified: Secondary | ICD-10-CM | POA: Diagnosis not present

## 2017-04-27 HISTORY — PX: REVERSE SHOULDER ARTHROPLASTY: SHX5054

## 2017-04-27 LAB — CBC WITH DIFFERENTIAL/PLATELET
BASOS ABS: 0.1 10*3/uL (ref 0.0–0.1)
Basophils Relative: 1 %
EOS PCT: 4 %
Eosinophils Absolute: 0.3 10*3/uL (ref 0.0–0.7)
HEMATOCRIT: 44.6 % (ref 36.0–46.0)
Hemoglobin: 14.3 g/dL (ref 12.0–15.0)
LYMPHS PCT: 19 %
Lymphs Abs: 1.6 10*3/uL (ref 0.7–4.0)
MCH: 30.3 pg (ref 26.0–34.0)
MCHC: 32.1 g/dL (ref 30.0–36.0)
MCV: 94.5 fL (ref 78.0–100.0)
Monocytes Absolute: 1.3 10*3/uL — ABNORMAL HIGH (ref 0.1–1.0)
Monocytes Relative: 15 %
NEUTROS ABS: 5.2 10*3/uL (ref 1.7–7.7)
Neutrophils Relative %: 61 %
PLATELETS: 286 10*3/uL (ref 150–400)
RBC: 4.72 MIL/uL (ref 3.87–5.11)
RDW: 13.4 % (ref 11.5–15.5)
WBC: 8.4 10*3/uL (ref 4.0–10.5)

## 2017-04-27 LAB — TYPE AND SCREEN
ABO/RH(D): O NEG
Antibody Screen: NEGATIVE

## 2017-04-27 LAB — ABO/RH: ABO/RH(D): O NEG

## 2017-04-27 LAB — COMPREHENSIVE METABOLIC PANEL
ALK PHOS: 96 U/L (ref 38–126)
ALT: 16 U/L (ref 14–54)
AST: 25 U/L (ref 15–41)
Albumin: 3.9 g/dL (ref 3.5–5.0)
Anion gap: 11 (ref 5–15)
BILIRUBIN TOTAL: 0.7 mg/dL (ref 0.3–1.2)
BUN: 8 mg/dL (ref 6–20)
CALCIUM: 9.2 mg/dL (ref 8.9–10.3)
CO2: 25 mmol/L (ref 22–32)
CREATININE: 0.88 mg/dL (ref 0.44–1.00)
Chloride: 99 mmol/L — ABNORMAL LOW (ref 101–111)
Glucose, Bld: 75 mg/dL (ref 65–99)
Potassium: 3.4 mmol/L — ABNORMAL LOW (ref 3.5–5.1)
Sodium: 135 mmol/L (ref 135–145)
TOTAL PROTEIN: 7 g/dL (ref 6.5–8.1)

## 2017-04-27 LAB — URINALYSIS, ROUTINE W REFLEX MICROSCOPIC
Bilirubin Urine: NEGATIVE
GLUCOSE, UA: NEGATIVE mg/dL
HGB URINE DIPSTICK: NEGATIVE
Ketones, ur: NEGATIVE mg/dL
LEUKOCYTES UA: NEGATIVE
Nitrite: NEGATIVE
PROTEIN: NEGATIVE mg/dL
SPECIFIC GRAVITY, URINE: 1.011 (ref 1.005–1.030)
pH: 7 (ref 5.0–8.0)

## 2017-04-27 LAB — PROTIME-INR
INR: 1.03
PROTHROMBIN TIME: 13.4 s (ref 11.4–15.2)

## 2017-04-27 LAB — APTT: aPTT: 38 seconds — ABNORMAL HIGH (ref 24–36)

## 2017-04-27 SURGERY — ARTHROPLASTY, SHOULDER, TOTAL, REVERSE
Anesthesia: General | Site: Shoulder | Laterality: Right

## 2017-04-27 MED ORDER — MIRABEGRON ER 25 MG PO TB24
25.0000 mg | ORAL_TABLET | Freq: Every day | ORAL | Status: DC
  Administered 2017-04-27 – 2017-04-28 (×2): 25 mg via ORAL
  Filled 2017-04-27 (×2): qty 1

## 2017-04-27 MED ORDER — PROMETHAZINE HCL 25 MG/ML IJ SOLN: 6.2500 mg | INTRAMUSCULAR | Status: DC | PRN

## 2017-04-27 MED ORDER — ONDANSETRON HCL 4 MG/2ML IJ SOLN
INTRAMUSCULAR | Status: AC
  Filled 2017-04-27: qty 2

## 2017-04-27 MED ORDER — FERROUS SULFATE 325 (65 FE) MG PO TABS
325.0000 mg | ORAL_TABLET | Freq: Every day | ORAL | Status: DC
  Administered 2017-04-28: 325 mg via ORAL
  Filled 2017-04-27: qty 1

## 2017-04-27 MED ORDER — ACETAMINOPHEN 500 MG PO TABS
1000.0000 mg | ORAL_TABLET | Freq: Four times a day (QID) | ORAL | Status: DC
  Administered 2017-04-27 – 2017-04-28 (×3): 1000 mg via ORAL
  Filled 2017-04-27 (×3): qty 2

## 2017-04-27 MED ORDER — ALPRAZOLAM 0.5 MG PO TABS
1.0000 mg | ORAL_TABLET | Freq: Three times a day (TID) | ORAL | Status: DC
  Administered 2017-04-27 – 2017-04-28 (×3): 1 mg via ORAL
  Filled 2017-04-27 (×3): qty 2

## 2017-04-27 MED ORDER — DOCUSATE SODIUM 100 MG PO CAPS
100.0000 mg | ORAL_CAPSULE | Freq: Two times a day (BID) | ORAL | Status: DC
  Administered 2017-04-27 – 2017-04-28 (×2): 100 mg via ORAL
  Filled 2017-04-27 (×2): qty 1

## 2017-04-27 MED ORDER — GABAPENTIN 400 MG PO CAPS
800.0000 mg | ORAL_CAPSULE | ORAL | Status: AC
  Administered 2017-04-27: 800 mg via ORAL
  Filled 2017-04-27: qty 2

## 2017-04-27 MED ORDER — SUGAMMADEX SODIUM 500 MG/5ML IV SOLN
INTRAVENOUS | Status: AC
  Filled 2017-04-27: qty 5

## 2017-04-27 MED ORDER — OXYCODONE HCL 5 MG PO TABS: 10.0000 mg | ORAL_TABLET | ORAL | Status: DC | PRN

## 2017-04-27 MED ORDER — ONDANSETRON HCL 4 MG/2ML IJ SOLN: 4.0000 mg | Freq: Four times a day (QID) | INTRAMUSCULAR | Status: DC | PRN

## 2017-04-27 MED ORDER — ALUM & MAG HYDROXIDE-SIMETH 200-200-20 MG/5ML PO SUSP: 30.0000 mL | ORAL | Status: DC | PRN

## 2017-04-27 MED ORDER — PANTOPRAZOLE SODIUM 40 MG PO TBEC
40.0000 mg | DELAYED_RELEASE_TABLET | Freq: Two times a day (BID) | ORAL | Status: DC
  Administered 2017-04-27 – 2017-04-28 (×2): 40 mg via ORAL
  Filled 2017-04-27 (×2): qty 1

## 2017-04-27 MED ORDER — PHENOL 1.4 % MT LIQD
1.0000 | OROMUCOSAL | Status: DC | PRN
Start: 2017-04-27 — End: 2017-04-28

## 2017-04-27 MED ORDER — SODIUM CHLORIDE 0.9 % IV SOLN: INTRAVENOUS | Status: DC

## 2017-04-27 MED ORDER — BUPIVACAINE LIPOSOME 1.3 % IJ SUSP
INTRAMUSCULAR | Status: DC | PRN
  Administered 2017-04-27: 10 mL via PERINEURAL

## 2017-04-27 MED ORDER — BUPIVACAINE-EPINEPHRINE (PF) 0.5% -1:200000 IJ SOLN
INTRAMUSCULAR | Status: DC | PRN
  Administered 2017-04-27: 15 mL via PERINEURAL

## 2017-04-27 MED ORDER — FENTANYL CITRATE (PF) 250 MCG/5ML IJ SOLN
INTRAMUSCULAR | Status: AC
  Filled 2017-04-27: qty 5

## 2017-04-27 MED ORDER — PROPOFOL 10 MG/ML IV BOLUS
INTRAVENOUS | Status: DC | PRN
  Administered 2017-04-27: 140 mg via INTRAVENOUS

## 2017-04-27 MED ORDER — METHOCARBAMOL 500 MG PO TABS
ORAL_TABLET | ORAL | Status: AC
  Administered 2017-04-27: 500 mg
  Filled 2017-04-27: qty 1

## 2017-04-27 MED ORDER — DIPHENHYDRAMINE HCL 12.5 MG/5ML PO ELIX: 12.5000 mg | ORAL_SOLUTION | ORAL | Status: DC | PRN

## 2017-04-27 MED ORDER — HYDROMORPHONE HCL 1 MG/ML IJ SOLN: 0.5000 mg | INTRAMUSCULAR | Status: DC | PRN

## 2017-04-27 MED ORDER — DEXAMETHASONE SODIUM PHOSPHATE 10 MG/ML IJ SOLN
INTRAMUSCULAR | Status: AC
  Filled 2017-04-27: qty 1

## 2017-04-27 MED ORDER — BISACODYL 5 MG PO TBEC: 5.0000 mg | DELAYED_RELEASE_TABLET | Freq: Every day | ORAL | Status: DC | PRN

## 2017-04-27 MED ORDER — DEXAMETHASONE SODIUM PHOSPHATE 10 MG/ML IJ SOLN
INTRAMUSCULAR | Status: DC | PRN
  Administered 2017-04-27: 10 mg via INTRAVENOUS

## 2017-04-27 MED ORDER — ACETAMINOPHEN 325 MG PO TABS: 325.0000 mg | ORAL_TABLET | Freq: Four times a day (QID) | ORAL | Status: DC | PRN

## 2017-04-27 MED ORDER — DULOXETINE HCL 30 MG PO CPEP
30.0000 mg | ORAL_CAPSULE | ORAL | Status: DC
  Administered 2017-04-28: 30 mg via ORAL
  Filled 2017-04-27: qty 1

## 2017-04-27 MED ORDER — ALBUTEROL SULFATE HFA 108 (90 BASE) MCG/ACT IN AERS
INHALATION_SPRAY | RESPIRATORY_TRACT | Status: AC
  Filled 2017-04-27: qty 6.7

## 2017-04-27 MED ORDER — DESMOPRESSIN ACETATE 0.2 MG PO TABS
0.2000 mg | ORAL_TABLET | Freq: Every day | ORAL | Status: DC
  Administered 2017-04-27: 0.2 mg via ORAL
  Filled 2017-04-27: qty 1

## 2017-04-27 MED ORDER — METHOCARBAMOL 500 MG PO TABS
500.0000 mg | ORAL_TABLET | Freq: Three times a day (TID) | ORAL | Status: DC
  Administered 2017-04-27 – 2017-04-28 (×4): 500 mg via ORAL
  Filled 2017-04-27 (×3): qty 1

## 2017-04-27 MED ORDER — FENTANYL CITRATE (PF) 100 MCG/2ML IJ SOLN
25.0000 ug | INTRAMUSCULAR | Status: DC | PRN
  Administered 2017-04-27 (×2): 50 ug via INTRAVENOUS

## 2017-04-27 MED ORDER — OXYCODONE HCL 5 MG PO TABS
ORAL_TABLET | ORAL | Status: AC
  Filled 2017-04-27: qty 1

## 2017-04-27 MED ORDER — ALPRAZOLAM 0.25 MG PO TABS
ORAL_TABLET | ORAL | Status: AC
  Administered 2017-04-27: 1 mg
  Filled 2017-04-27: qty 4

## 2017-04-27 MED ORDER — OXYCODONE HCL 5 MG PO TABS
5.0000 mg | ORAL_TABLET | Freq: Once | ORAL | Status: AC | PRN
  Administered 2017-04-27: 5 mg via ORAL

## 2017-04-27 MED ORDER — MENTHOL 3 MG MT LOZG: 1.0000 | LOZENGE | OROMUCOSAL | Status: DC | PRN

## 2017-04-27 MED ORDER — OXYCODONE HCL 5 MG PO TABS
5.0000 mg | ORAL_TABLET | ORAL | Status: DC | PRN
  Administered 2017-04-28: 10 mg via ORAL
  Filled 2017-04-27: qty 2

## 2017-04-27 MED ORDER — FENTANYL CITRATE (PF) 100 MCG/2ML IJ SOLN
INTRAMUSCULAR | Status: AC
  Filled 2017-04-27: qty 2

## 2017-04-27 MED ORDER — OXYCODONE HCL 5 MG/5ML PO SOLN: 5.0000 mg | Freq: Once | ORAL | Status: AC | PRN

## 2017-04-27 MED ORDER — CITALOPRAM HYDROBROMIDE 20 MG PO TABS
40.0000 mg | ORAL_TABLET | Freq: Every day | ORAL | Status: DC
  Administered 2017-04-28: 40 mg via ORAL
  Filled 2017-04-27: qty 2

## 2017-04-27 MED ORDER — PHENYLEPHRINE HCL 10 MG/ML IJ SOLN
INTRAVENOUS | Status: DC | PRN
  Administered 2017-04-27: 25 ug/min via INTRAVENOUS

## 2017-04-27 MED ORDER — METHOCARBAMOL 500 MG PO TABS: 500.0000 mg | ORAL_TABLET | Freq: Three times a day (TID) | ORAL | Status: DC

## 2017-04-27 MED ORDER — TRAMADOL HCL 50 MG PO TABS: 50.0000 mg | ORAL_TABLET | Freq: Four times a day (QID) | ORAL | Status: DC | PRN

## 2017-04-27 MED ORDER — SUGAMMADEX SODIUM 200 MG/2ML IV SOLN
INTRAVENOUS | Status: DC | PRN
  Administered 2017-04-27: 130 mg via INTRAVENOUS

## 2017-04-27 MED ORDER — POVIDONE-IODINE 7.5 % EX SOLN: Freq: Once | CUTANEOUS | Status: DC

## 2017-04-27 MED ORDER — ROCURONIUM BROMIDE 10 MG/ML (PF) SYRINGE
PREFILLED_SYRINGE | INTRAVENOUS | Status: DC | PRN
  Administered 2017-04-27: 20 mg via INTRAVENOUS
  Administered 2017-04-27: 40 mg via INTRAVENOUS

## 2017-04-27 MED ORDER — SODIUM CHLORIDE 0.9 % IV SOLN
1000.0000 mg | INTRAVENOUS | Status: AC
  Administered 2017-04-27: 1000 mg via INTRAVENOUS
  Filled 2017-04-27: qty 1100

## 2017-04-27 MED ORDER — 0.9 % SODIUM CHLORIDE (POUR BTL) OPTIME
TOPICAL | Status: DC | PRN
  Administered 2017-04-27: 1000 mL

## 2017-04-27 MED ORDER — CEFAZOLIN SODIUM-DEXTROSE 1-4 GM/50ML-% IV SOLN
1.0000 g | Freq: Four times a day (QID) | INTRAVENOUS | Status: AC
  Administered 2017-04-27 – 2017-04-28 (×3): 1 g via INTRAVENOUS
  Filled 2017-04-27 (×3): qty 50

## 2017-04-27 MED ORDER — ONDANSETRON HCL 4 MG PO TABS: 4.0000 mg | ORAL_TABLET | Freq: Four times a day (QID) | ORAL | Status: DC | PRN

## 2017-04-27 MED ORDER — GABAPENTIN 400 MG PO CAPS
800.0000 mg | ORAL_CAPSULE | Freq: Four times a day (QID) | ORAL | Status: DC
  Administered 2017-04-27 – 2017-04-28 (×3): 800 mg via ORAL
  Filled 2017-04-27 (×3): qty 2

## 2017-04-27 MED ORDER — LACTATED RINGERS IV SOLN
INTRAVENOUS | Status: DC
Start: 2017-04-27 — End: 2017-04-27
  Administered 2017-04-27: 09:00:00 via INTRAVENOUS

## 2017-04-27 MED ORDER — SODIUM CHLORIDE 0.9 % IV SOLN
INTRAVENOUS | Status: DC
  Administered 2017-04-27: 19:00:00 via INTRAVENOUS

## 2017-04-27 MED ORDER — LIDOCAINE HCL (CARDIAC) 20 MG/ML IV SOLN
INTRAVENOUS | Status: AC
Start: 2017-04-27 — End: 2017-04-27
  Filled 2017-04-27: qty 5

## 2017-04-27 MED ORDER — ASPIRIN EC 325 MG PO TBEC
325.0000 mg | DELAYED_RELEASE_TABLET | Freq: Every day | ORAL | Status: DC
  Administered 2017-04-27: 325 mg via ORAL
  Filled 2017-04-27 (×2): qty 1

## 2017-04-27 MED ORDER — PROPOFOL 10 MG/ML IV BOLUS
INTRAVENOUS | Status: AC
  Filled 2017-04-27: qty 20

## 2017-04-27 MED ORDER — LIDOCAINE HCL (CARDIAC) 20 MG/ML IV SOLN
INTRAVENOUS | Status: DC | PRN
  Administered 2017-04-27: 40 mg via INTRAVENOUS

## 2017-04-27 MED ORDER — FLEET ENEMA 7-19 GM/118ML RE ENEM: 1.0000 | ENEMA | Freq: Once | RECTAL | Status: DC | PRN

## 2017-04-27 MED ORDER — SUGAMMADEX SODIUM 200 MG/2ML IV SOLN
INTRAVENOUS | Status: AC
  Filled 2017-04-27: qty 2

## 2017-04-27 MED ORDER — FENTANYL CITRATE (PF) 250 MCG/5ML IJ SOLN
INTRAMUSCULAR | Status: DC | PRN
  Administered 2017-04-27: 100 ug via INTRAVENOUS

## 2017-04-27 MED ORDER — METOCLOPRAMIDE HCL 5 MG PO TABS: 5.0000 mg | ORAL_TABLET | Freq: Three times a day (TID) | ORAL | Status: DC | PRN

## 2017-04-27 MED ORDER — MIDAZOLAM HCL 2 MG/2ML IJ SOLN
INTRAMUSCULAR | Status: AC
  Filled 2017-04-27: qty 2

## 2017-04-27 MED ORDER — METHOCARBAMOL 500 MG PO TABS
500.0000 mg | ORAL_TABLET | Freq: Three times a day (TID) | ORAL | Status: DC | PRN
  Administered 2017-04-28: 500 mg via ORAL
  Filled 2017-04-27 (×2): qty 1

## 2017-04-27 MED ORDER — TIZANIDINE HCL 2 MG PO TABS: 2.0000 mg | ORAL_TABLET | Freq: Two times a day (BID) | ORAL | Status: DC | PRN

## 2017-04-27 MED ORDER — METOCLOPRAMIDE HCL 5 MG/ML IJ SOLN: 5.0000 mg | Freq: Three times a day (TID) | INTRAMUSCULAR | Status: DC | PRN

## 2017-04-27 MED ORDER — ALBUTEROL SULFATE (2.5 MG/3ML) 0.083% IN NEBU: 2.5000 mg | INHALATION_SOLUTION | Freq: Four times a day (QID) | RESPIRATORY_TRACT | Status: DC | PRN

## 2017-04-27 MED ORDER — CEFAZOLIN SODIUM-DEXTROSE 2-4 GM/100ML-% IV SOLN
2.0000 g | INTRAVENOUS | Status: AC
  Administered 2017-04-27: 2 g via INTRAVENOUS
  Filled 2017-04-27: qty 100

## 2017-04-27 MED ORDER — POLYETHYLENE GLYCOL 3350 17 G PO PACK: 17.0000 g | PACK | Freq: Every day | ORAL | Status: DC | PRN

## 2017-04-27 MED ORDER — ALBUTEROL SULFATE HFA 108 (90 BASE) MCG/ACT IN AERS
INHALATION_SPRAY | RESPIRATORY_TRACT | Status: DC | PRN
  Administered 2017-04-27: 4 via RESPIRATORY_TRACT

## 2017-04-27 MED ORDER — METOPROLOL SUCCINATE ER 50 MG PO TB24
50.0000 mg | ORAL_TABLET | Freq: Two times a day (BID) | ORAL | Status: DC
  Administered 2017-04-27 – 2017-04-28 (×2): 50 mg via ORAL
  Filled 2017-04-27 (×2): qty 1

## 2017-04-27 MED ORDER — VITAMIN B-12 1000 MCG PO TABS
1000.0000 ug | ORAL_TABLET | Freq: Every day | ORAL | Status: DC
  Administered 2017-04-27 – 2017-04-28 (×2): 1000 ug via ORAL
  Filled 2017-04-27 (×2): qty 1

## 2017-04-27 MED ORDER — ZOLPIDEM TARTRATE 5 MG PO TABS: 5.0000 mg | ORAL_TABLET | Freq: Every evening | ORAL | Status: DC | PRN

## 2017-04-27 MED ORDER — ONDANSETRON HCL 4 MG/2ML IJ SOLN
INTRAMUSCULAR | Status: DC | PRN
  Administered 2017-04-27: 4 mg via INTRAVENOUS

## 2017-04-27 MED ORDER — SODIUM CHLORIDE 0.9 % IR SOLN
Status: DC | PRN
  Administered 2017-04-27: 3000 mL

## 2017-04-27 SURGICAL SUPPLY — 77 items
AID PSTN UNV HD RSTRNT DISP (MISCELLANEOUS) ×1
BASEPLATE P2 COATD GLND 6.5X30 (Shoulder) IMPLANT
BLADE SAW SAG 73X25 THK (BLADE) ×2
BLADE SAW SGTL 73X25 THK (BLADE) ×1 IMPLANT
BLADE SURG 15 STRL LF DISP TIS (BLADE) IMPLANT
BLADE SURG 15 STRL SS (BLADE) ×3
BSPLAT GLND 30 STRL LF SHLDR (Shoulder) ×1 IMPLANT
CHLORAPREP W/TINT 26ML (MISCELLANEOUS) ×5 IMPLANT
CLOSURE WOUND 1/2 X4 (GAUZE/BANDAGES/DRESSINGS) ×1
COVER SURGICAL LIGHT HANDLE (MISCELLANEOUS) ×3 IMPLANT
DRAPE INCISE IOBAN 66X45 STRL (DRAPES) ×3 IMPLANT
DRAPE ORTHO SPLIT 77X108 STRL (DRAPES) ×6
DRAPE SURG ORHT 6 SPLT 77X108 (DRAPES) ×2 IMPLANT
DRSG AQUACEL AG ADV 3.5X10 (GAUZE/BANDAGES/DRESSINGS) ×3 IMPLANT
ELECT BLADE 4.0 EZ CLEAN MEGAD (MISCELLANEOUS) ×3
ELECT REM PT RETURN 9FT ADLT (ELECTROSURGICAL) ×3
ELECTRODE BLDE 4.0 EZ CLN MEGD (MISCELLANEOUS) ×1 IMPLANT
ELECTRODE REM PT RTRN 9FT ADLT (ELECTROSURGICAL) ×1 IMPLANT
EVACUATOR 1/8 PVC DRAIN (DRAIN) IMPLANT
GLOVE BIO SURGEON STRL SZ7 (GLOVE) ×5 IMPLANT
GLOVE BIO SURGEON STRL SZ7.5 (GLOVE) ×3 IMPLANT
GLOVE BIOGEL PI IND STRL 6.5 (GLOVE) IMPLANT
GLOVE BIOGEL PI IND STRL 7.0 (GLOVE) ×1 IMPLANT
GLOVE BIOGEL PI IND STRL 8 (GLOVE) ×1 IMPLANT
GLOVE BIOGEL PI INDICATOR 6.5 (GLOVE) ×2
GLOVE BIOGEL PI INDICATOR 7.0 (GLOVE) ×2
GLOVE BIOGEL PI INDICATOR 8 (GLOVE) ×2
GOWN STRL REUS W/ TWL LRG LVL3 (GOWN DISPOSABLE) ×3 IMPLANT
GOWN STRL REUS W/ TWL XL LVL3 (GOWN DISPOSABLE) ×1 IMPLANT
GOWN STRL REUS W/TWL LRG LVL3 (GOWN DISPOSABLE) ×9
GOWN STRL REUS W/TWL XL LVL3 (GOWN DISPOSABLE) ×3
HANDPIECE INTERPULSE COAX TIP (DISPOSABLE) ×3
HOOD PEEL AWAY FACE SHEILD DIS (HOOD) ×2 IMPLANT
HOOD PEEL AWAY FLYTE STAYCOOL (MISCELLANEOUS) ×8 IMPLANT
INSERT SMALL SOCKET 32MM NEU (Insert) ×2 IMPLANT
KIT BASIN OR (CUSTOM PROCEDURE TRAY) ×3 IMPLANT
KIT ROOM TURNOVER OR (KITS) ×3 IMPLANT
MANIFOLD NEPTUNE II (INSTRUMENTS) ×3 IMPLANT
NDL MAYO TROCAR (NEEDLE) IMPLANT
NEEDLE MAYO TROCAR (NEEDLE) ×3 IMPLANT
NS IRRIG 1000ML POUR BTL (IV SOLUTION) ×3 IMPLANT
P2 COATDE GLNOID BSEPLT 6.5X30 (Shoulder) ×3 IMPLANT
PACK SHOULDER (CUSTOM PROCEDURE TRAY) ×3 IMPLANT
PAD ARMBOARD 7.5X6 YLW CONV (MISCELLANEOUS) ×6 IMPLANT
RESTRAINT HEAD UNIVERSAL NS (MISCELLANEOUS) ×3 IMPLANT
RETRIEVER SUT HEWSON (MISCELLANEOUS) ×2 IMPLANT
SCREW BONE LOCKING RSP 5.0X14 (Screw) ×3 IMPLANT
SCREW BONE LOCKING RSP 5.0X30 (Screw) ×6 IMPLANT
SCREW BONE RSP LOCK 5X14 (Screw) IMPLANT
SCREW BONE RSP LOCK 5X18 (Screw) IMPLANT
SCREW BONE RSP LOCK 5X30 (Screw) IMPLANT
SCREW BONE RSP LOCKING 18MM LG (Screw) ×3 IMPLANT
SCREW RETAIN W/HEAD 4MM OFFSET (Shoulder) ×2 IMPLANT
SET HNDPC FAN SPRY TIP SCT (DISPOSABLE) ×1 IMPLANT
SLING ARM FOAM STRAP MED (SOFTGOODS) ×2 IMPLANT
SPONGE LAP 18X18 X RAY DECT (DISPOSABLE) ×2 IMPLANT
SPONGE LAP 4X18 X RAY DECT (DISPOSABLE) IMPLANT
STAPLER VISISTAT 35W (STAPLE) ×2 IMPLANT
STEM HUMERAL REV SHL 6X108 SM (Stem) ×2 IMPLANT
STRIP CLOSURE SKIN 1/2X4 (GAUZE/BANDAGES/DRESSINGS) ×2 IMPLANT
SUCTION FRAZIER HANDLE 10FR (MISCELLANEOUS) ×2
SUCTION TUBE FRAZIER 10FR DISP (MISCELLANEOUS) ×1 IMPLANT
SUPPORT WRAP ARM LG (MISCELLANEOUS) ×3 IMPLANT
SUT ETHIBOND NAB CT1 #1 30IN (SUTURE) ×3 IMPLANT
SUT FIBERWIRE #2 38 REV NDL BL (SUTURE) ×3
SUT FIBERWIRE #2 38 T-5 BLUE (SUTURE)
SUT MNCRL AB 4-0 PS2 18 (SUTURE) ×3 IMPLANT
SUT SILK 2 0 TIES 17X18 (SUTURE)
SUT SILK 2-0 18XBRD TIE BLK (SUTURE) IMPLANT
SUT VIC AB 2-0 CT1 27 (SUTURE) ×3
SUT VIC AB 2-0 CT1 TAPERPNT 27 (SUTURE) ×1 IMPLANT
SUTURE FIBERWR #2 38 T-5 BLUE (SUTURE) IMPLANT
SUTURE FIBERWR#2 38 REV NDL BL (SUTURE) IMPLANT
SYR 30ML LL (SYRINGE) IMPLANT
TOWEL OR 17X26 10 PK STRL BLUE (TOWEL DISPOSABLE) ×3 IMPLANT
WATER STERILE IRR 1000ML POUR (IV SOLUTION) ×3 IMPLANT
YANKAUER SUCT BULB TIP NO VENT (SUCTIONS) ×2 IMPLANT

## 2017-04-27 NOTE — Anesthesia Procedure Notes (Addendum)
Anesthesia Regional Block: Interscalene brachial plexus block   Pre-Anesthetic Checklist: ,, timeout performed, Correct Patient, Correct Site, Correct Laterality, Correct Procedure, Correct Position, site marked, Risks and benefits discussed,  Surgical consent,  Pre-op evaluation,  At surgeon's request and post-op pain management  Laterality: Right  Prep: chloraprep       Needles:  Injection technique: Single-shot  Needle Type: Echogenic Needle     Needle Length: 5cm  Needle Gauge: 21     Additional Needles:   Narrative:  Start time: 04/27/2017 9:29 AM End time: 04/27/2017 9:33 AM Injection made incrementally with aspirations every 5 mL.  Performed by: Personally  Anesthesiologist: Audry Pili, MD  Additional Notes: No pain on injection. No increased resistance to injection. Injection made in 5cc increments. Good needle visualization. Patient tolerated the procedure well.

## 2017-04-27 NOTE — Progress Notes (Signed)
Report given to erika rn as caregiver 

## 2017-04-27 NOTE — Discharge Instructions (Signed)
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
## 2017-04-27 NOTE — Anesthesia Postprocedure Evaluation (Signed)
Anesthesia Post Note  Patient: Brooke Deleon  Procedure(s) Performed: RIGHT REVERSE TOTAL SHOULDER ARTHROPLASTY (Right Shoulder)     Patient location during evaluation: PACU Anesthesia Type: General Level of consciousness: awake and alert Pain management: pain level controlled Vital Signs Assessment: post-procedure vital signs reviewed and stable Respiratory status: spontaneous breathing, nonlabored ventilation and respiratory function stable Cardiovascular status: blood pressure returned to baseline and stable Postop Assessment: no apparent nausea or vomiting Anesthetic complications: no    Vitals:   04/27/17 1200 04/27/17 1215  BP: 124/75 (!) 108/52  Pulse: 63 74  Resp: 18 (!) 22  Temp:  (!) 36.3 C  SpO2: 98% 98%    Last Pain:  Vitals:   04/27/17 1120  TempSrc:   PainSc: 0-No pain                 Audry Pili

## 2017-04-27 NOTE — Op Note (Signed)
Procedure(s): RIGHT REVERSE TOTAL SHOULDER ARTHROPLASTY Procedure Note  JESSLY LEBECK female 62 y.o. 04/27/2017  Procedure(s) and Anesthesia Type:    * RIGHT REVERSE TOTAL SHOULDER ARTHROPLASTY - Choice   Indications:  62 y.o. female  With endstage right shoulder arthritis with irrepairable rotator cuff tear. Pain and dysfunction interfered with quality of life and nonoperative treatment with activity modification, NSAIDS and injections failed.     Surgeon: Isabella Stalling   Assistants: Jeanmarie Hubert PA-C [facility] was present and scrubbed throughout the procedure and was essential in positioning, retraction, exposure, and closure)  Anesthesia: General endotracheal anesthesia with preoperative interscalene block given by the attending anesthesiologist    Procedure Detail  RIGHT REVERSE TOTAL SHOULDER ARTHROPLASTY   Estimated Blood Loss:  200 mL         Drains: none  Blood Given: none          Specimens: none        Complications:  * No complications entered in OR log *         Disposition: PACU - hemodynamically stable.         Condition: stable      OPERATIVE FINDINGS:  A DJO Altivate pressfit reverse total shoulder arthroplasty was placed with a  size 6 stem, a 32-4 glenosphere, and a standard poly insert. The base plate  fixation was excellent.  PROCEDURE: The patient was identified in the preoperative holding area  where I personally marked the operative site after verifying site, side,  and procedure with the patient. An interscalene block given by  the attending anesthesiologist in the holding area and the patient was taken back to the operating room where all extremities were  carefully padded in position after general anesthesia was induced. She  was placed in a beach-chair position and the operative upper extremity was  prepped and draped in a standard sterile fashion. The patient received 1 g IV tranexamic acid at the start of the case around  time of the incision. An approximately 10-  cm incision was made from the tip of the coracoid process to the center  point of the humerus at the level of the axilla. Dissection was carried  down through subcutaneous tissues to the level of the cephalic vein  which was taken laterally with the deltoid. The pectoralis major was  retracted medially. The subdeltoid space was developed and the lateral  edge of the conjoined tendon was identified. The undersurface of  conjoined tendon was palpated and the musculocutaneous nerve was not in  the field. Retractor was placed underneath the conjoined and second  retractor was placed lateral into the deltoid. The circumflex humeral  artery and vessels were identified and clamped and coagulated. The  biceps tendon was absent.  The subscapularis was taken down as a peel.  The  joint was then gently externally rotated while the capsule was released  from the humeral neck around to just beyond the 6 o'clock position. At  this point, the joint was dislocated and the humeral head was presented  into the wound. The excessive osteophyte formation was removed with a  large rongeur.  The cutting guide was used to make the appropriate  head cut and the head was saved for potentially bone grafting.  The glenoid was exposed with the arm in an  abducted extended position. The anterior and posterior labrum were  completely excised and the capsule was released circumferentially to  allow for exposure of the glenoid for preparation. The  2.5 mm drill was  placed using the guide in 5-10 inferior angulation and the tap was then advanced in the same hole. Small and large reamers were then used. The tap was then removed and the Metaglene was then screwed in with excellent purchase.  The peripheral guide was then used to drilled measured and filled peripheral locking screws. The size  32-4  glenosphere was then impacted on the [facility] taper and the central screw was placed. The  humerus was then again exposed and the diaphyseal reamers were used followed by the metaphyseal reamers. The final broach was left in place in the proximal trial was placed. The joint was reduced and with this implant it was felt that soft tissue tensioning was appropriate with excellent stability and excellent range of motion. Therefore, final humeral stem was placed pressfit with bone grafting.  And then the trial polyethylene inserts were tested again and the above implant was felt to be the most appropriate for final insertion. The joint was reduced taken through full range of motion and felt to be stable. Soft tissue tension was appropriate.  The joint was then copiously irrigated with pulse  lavage and the wound was then closed. The subscapularis was repaired through bone tunnels.  Skin was closed with 2-0 Vicryl in a deep dermal layer and 4-0  Monocryl for skin closure. Steri-Strips were applied. Sterile  dressings were then applied as well as a sling. The patient was allowed  to awaken from general anesthesia, transferred to stretcher, and taken  to recovery room in stable condition.   POSTOPERATIVE PLAN: The patient will be kept in the hospital postoperatively  for pain control and therapy.

## 2017-04-27 NOTE — Progress Notes (Signed)
Transferred from PACU alert,oriented x4, sling R shoulder in situ

## 2017-04-27 NOTE — Transfer of Care (Signed)
Immediate Anesthesia Transfer of Care Note  Patient: Brooke Deleon  Procedure(s) Performed: RIGHT REVERSE TOTAL SHOULDER ARTHROPLASTY (Right Shoulder)  Patient Location: PACU  Anesthesia Type:General  Level of Consciousness: awake and oriented  Airway & Oxygen Therapy: Patient Spontanous Breathing and Patient connected to nasal cannula oxygen  Post-op Assessment: Report given to RN  Post vital signs: Reviewed and stable  Last Vitals:  Vitals Value Taken Time  BP    Temp    Pulse    Resp 16 04/27/2017 11:19 AM  SpO2    Vitals shown include unvalidated device data.  Last Pain:  Vitals:   04/27/17 0904  TempSrc:   PainSc: 8          Complications: No apparent anesthesia complications

## 2017-04-27 NOTE — Anesthesia Procedure Notes (Signed)

## 2017-04-28 ENCOUNTER — Encounter (HOSPITAL_COMMUNITY): Payer: Self-pay | Admitting: Orthopedic Surgery

## 2017-04-28 LAB — BASIC METABOLIC PANEL
ANION GAP: 7 (ref 5–15)
BUN: 5 mg/dL — ABNORMAL LOW (ref 6–20)
CALCIUM: 8.7 mg/dL — AB (ref 8.9–10.3)
CO2: 29 mmol/L (ref 22–32)
Chloride: 100 mmol/L — ABNORMAL LOW (ref 101–111)
Creatinine, Ser: 0.81 mg/dL (ref 0.44–1.00)
Glucose, Bld: 136 mg/dL — ABNORMAL HIGH (ref 65–99)
POTASSIUM: 3.8 mmol/L (ref 3.5–5.1)
Sodium: 136 mmol/L (ref 135–145)

## 2017-04-28 LAB — CBC
HEMATOCRIT: 37.3 % (ref 36.0–46.0)
HEMOGLOBIN: 11.9 g/dL — AB (ref 12.0–15.0)
MCH: 29.9 pg (ref 26.0–34.0)
MCHC: 31.9 g/dL (ref 30.0–36.0)
MCV: 93.7 fL (ref 78.0–100.0)
Platelets: 231 10*3/uL (ref 150–400)
RBC: 3.98 MIL/uL (ref 3.87–5.11)
RDW: 13.5 % (ref 11.5–15.5)
WBC: 13.8 10*3/uL — ABNORMAL HIGH (ref 4.0–10.5)

## 2017-04-28 MED ORDER — OXYCODONE HCL 5 MG PO TABS: 5.0000 mg | ORAL_TABLET | ORAL | 0 refills | Status: DC | PRN

## 2017-04-28 NOTE — Discharge Summary (Signed)
Patient ID: Brooke Deleon MRN: [id number] DOB/AGE: Oct 14, 1955 62 y.o.  Admit date: 04/27/2017 Discharge date: 04/28/2017  Admission Diagnoses:  Active Problems:   S/P reverse total shoulder arthroplasty, right   Discharge Diagnoses:  Same  Past Medical History:  Diagnosis Date  . Allergy   . Anemia   . Anxiety   . Arthritis   . Asthma   . COPD (chronic obstructive pulmonary disease) ([place])   . Depression   . Fibromyalgia   . Gastric ulcer   . GERD (gastroesophageal reflux disease)   . Hypertension   . Neuropathy   . Stress incontinence   . Tachycardia     Surgeries: Procedure(s): RIGHT REVERSE TOTAL SHOULDER ARTHROPLASTY on 04/27/2017   Consultants:   Discharged Condition: Improved  Hospital Course: Brooke Deleon is an 61 y.o. female who was admitted 04/27/2017 for operative treatment of right rotator cuff tear arthropathy. Patient has severe unremitting pain that affects sleep, daily activities, and work/hobbies. After pre-op clearance the patient was taken to the operating room on 04/27/2017 and underwent  Procedure(s): RIGHT REVERSE TOTAL SHOULDER ARTHROPLASTY.    Patient was given perioperative antibiotics:  Anti-infectives (From admission, onward)   Start     Dose/Rate Route Frequency Ordered Stop   04/27/17 1700  ceFAZolin (ANCEF) IVPB 1 g/50 mL premix     1 g 100 mL/hr over 30 Minutes Intravenous Every 6 hours 04/27/17 1628 04/28/17 0542   04/27/17 0821  ceFAZolin (ANCEF) IVPB 2g/100 mL premix     2 g 200 mL/hr over 30 Minutes Intravenous On call to O.R. 04/27/17 7902 04/27/17 0955       Patient was given sequential compression devices, early ambulation, and asa to prevent DVT.  Patient benefited maximally from hospital stay and there were no complications.    Recent vital signs:  Patient Vitals for the past 24 hrs:  BP Temp Temp src Pulse Resp SpO2 Height Weight  04/28/17 0538 (!) 168/90 98.7 F (37.1 C) Oral 75 19 98 % - -  04/28/17 0141 107/61 98.2  F (36.8 C) Oral 70 18 94 % - -  04/27/17 2144 (!) 143/69 98.7 F (37.1 C) Oral 75 19 94 % - -  04/27/17 1700 (!) 160/86 98.2 F (36.8 C) Oral 69 18 94 % - -  04/27/17 1515 117/60 - - 74 16 100 % - -  04/27/17 1415 125/88 - - 72 16 100 % - -  04/27/17 1315 (!) 142/91 - - 70 15 100 % - -  04/27/17 1215 (!) 108/52 (!) 97.4 F (36.3 C) - 74 (!) 22 98 % - -  04/27/17 1200 124/75 - - 63 18 98 % - -  04/27/17 1145 138/66 - - 66 16 100 % - -  04/27/17 1130 (!) 155/84 - - 68 16 100 % - -  04/27/17 1120 134/80 97.8 F (36.6 C) - 74 16 100 % - -  04/27/17 0908 - - - - - - 5' (1.524 m) 64 kg (141 lb)  04/27/17 0833 (!) 162/86 97.8 F (36.6 C) Oral 68 18 100 % 5' (1.524 m) 64 kg (141 lb)     Recent laboratory studies:  Recent Labs    04/27/17 0834 04/28/17 0517  WBC 8.4 13.8*  HGB 14.3 11.9*  HCT 44.6 37.3  PLT 286 231  NA 135 136  K 3.4* 3.8  CL 99* 100*  CO2 25 29  BUN 8 5*  CREATININE 0.88 0.81  GLUCOSE 75 136*  INR 1.03  --   CALCIUM 9.2 8.7*     Discharge Medications:   Allergies as of 04/28/2017      Reactions   Codeine Rash      Medication List    TAKE these medications   albuterol 108 (90 Base) MCG/ACT inhaler Commonly known as:  PROVENTIL HFA;VENTOLIN HFA Inhale 1-2 puffs into the lungs every 6 (six) hours as needed for wheezing or shortness of breath.   ALPRAZolam 1 MG tablet Commonly known as:  XANAX Take 1 mg by mouth 3 (three) times daily.   cholecalciferol 1000 units tablet Commonly known as:  VITAMIN D Take 1,000 Units by mouth daily.   citalopram 40 MG tablet Commonly known as:  CELEXA Take 40 mg by mouth daily.   desmopressin 0.2 MG tablet Commonly known as:  DDAVP Take 0.2 mg by mouth at bedtime.   DULoxetine 30 MG capsule Commonly known as:  CYMBALTA Take 30 mg by mouth every morning.   ferrous sulfate 325 (65 FE) MG tablet Take 325 mg by mouth daily with breakfast.   fluticasone 50 MCG/ACT nasal spray Commonly known as:   FLONASE Place 1 spray into both nostrils daily as needed for allergies.   gabapentin 800 MG tablet Commonly known as:  NEURONTIN Take 800 mg by mouth 4 (four) times daily.   methocarbamol 500 MG tablet Commonly known as:  ROBAXIN Take 500-1,000 mg by mouth 3 (three) times daily.   metoprolol succinate 100 MG 24 hr tablet Commonly known as:  TOPROL-XL Take 50 mg by mouth 2 (two) times daily.   multivitamin with minerals Tabs tablet Take 1 tablet by mouth daily.   MYRBETRIQ 25 MG Tb24 tablet Generic drug:  mirabegron ER Take 25 mg by mouth daily.   oxyCODONE 5 MG immediate release tablet Commonly known as:  Oxy IR/ROXICODONE Take 1-2 tablets (5-10 mg total) by mouth every 4 (four) hours as needed for moderate pain (pain score 4-6).   oxyCODONE-acetaminophen 10-325 MG tablet Commonly known as:  PERCOCET Take 1 tablet by mouth every 4 (four) hours as needed for pain. What changed:  how much to take   pantoprazole 40 MG tablet Commonly known as:  PROTONIX TAKE (1) TABLET TWICE A DAY BEFORE MEALS.   tiZANidine 2 MG tablet Commonly known as:  ZANAFLEX Take 2-4 mg by mouth 2 (two) times daily as needed for muscle spasms.   traMADol 50 MG tablet Commonly known as:  ULTRAM Take 50-100 mg by mouth every 6 (six) hours as needed for moderate pain.   vitamin B-12 1000 MCG tablet Commonly known as:  CYANOCOBALAMIN Take 1,000 mcg by mouth daily.   vitamin C 500 MG tablet Commonly known as:  ASCORBIC ACID Take 1,000 mg by mouth daily.       Diagnostic Studies: Dg Shoulder Right Port  Result Date: 04/27/2017 CLINICAL DATA:  Status post reverse right total shoulder joint prosthesis placement. EXAM: PORTABLE RIGHT SHOULDER COMPARISON:  Preoperative right shoulder series of December 27, 2016. FINDINGS: The patient has undergone reverse right shoulder prosthesis placement. Radiographic positioning of the prosthetic components is good. The interface with the native bone is normal. No  acute native bone abnormality is observed. Surgical skin staples are present. IMPRESSION: No immediate postprocedure complication following reverse right shoulder joint prosthesis placement. Electronically Signed   By: David  Martinique M.D.   On: 04/27/2017 13:19    Disposition: Discharge disposition: 01-Home or Self Care       Discharge Instructions  Call MD / Call 911   Complete by:  As directed    If you experience chest pain or shortness of breath, CALL 911 and be transported to the hospital emergency room.  If you develope a fever above 101 F, pus (white drainage) or increased drainage or redness at the wound, or calf pain, call your surgeon's office.   Constipation Prevention   Complete by:  As directed    Drink plenty of fluids.  Prune juice may be helpful.  You may use a stool softener, such as Colace (over the counter) 100 mg twice a day.  Use MiraLax (over the counter) for constipation as needed.   Diet - low sodium heart healthy   Complete by:  As directed    Increase activity slowly as tolerated   Complete by:  As directed       Follow-up Information    Tania Ade, MD. Schedule an appointment as soon as possible for a visit in 2 weeks.   Specialty:  Orthopedic Surgery Contact information: [place] [address] [phone]            Signed: Grier Mitts 04/28/2017, 7:47 AM

## 2017-04-28 NOTE — Evaluation (Signed)
Occupational Therapy Evaluation Patient Details Name: Brooke Deleon MRN: [id number] DOB: 1955-12-08 Today's Date: 04/28/2017    History of Present Illness Pt is a 62 y.o. female s/p R Reverse TSA. PMHx: Anxiety, Arthritis, Asthma, COPD, Depression, Fibromyalgia, GERD, HTN, Neuropathy, Sress incontinence, Tachycardia, R knee arthroscopy with medial menisectomy in 2018.   Clinical Impression   Pt reports she was independent with ADL PTA. Currently pt overall min guard for ADL and functional mobility with the exception of min assist for UB ADL. All safety, ADL, and shoulder education completed with pt; pt verbalized understanding and able to teach back at end of session. RUE nerve block still intact; pt able to perform AAROM/self ROM RUE elbow/wrist/hand x5 each. Pt planning to d/c home alone with intermittent supervision from family/friends. Pt would benefit from continued skilled OT to address established goals.    Follow Up Recommendations  Follow surgeon's recommendation for DC plan and follow-up therapies;Supervision - Intermittent    Equipment Recommendations  Tub/shower seat    Recommendations for Other Services       Precautions / Restrictions Precautions Precautions: Fall;Shoulder Type of Shoulder Precautions: NO A/PROM shoulder. NO pendulums. AROM elbow/wrist/hand OK. Shoulder Interventions: Shoulder sling/immobilizer;At all times;Off for dressing/bathing/exercises Precaution Booklet Issued: Yes (comment) Required Braces or Orthoses: Sling Restrictions Weight Bearing Restrictions: Yes RUE Weight Bearing: Non weight bearing      Mobility Bed Mobility Overal bed mobility: Needs Assistance Bed Mobility: Supine to Sit     Supine to sit: Supervision;HOB elevated     General bed mobility comments: Supervision for safety. HOB slightly elevated but no use of bed rail. Increased time but pt able to perform safety while maintaining RUE NWB  Transfers Overall transfer level:  Needs assistance Equipment used: None Transfers: Sit to/from Stand Sit to Stand: Min guard         General transfer comment: for safety, good hand placement and technique    Balance Overall balance assessment: Mild deficits observed, not formally tested                                         ADL either performed or assessed with clinical judgement   ADL Overall ADL's : Needs assistance/impaired Eating/Feeding: Set up;Sitting   Grooming: Standing;Wash/dry hands;Min guard   Upper Body Bathing: Minimal assistance;Sitting Upper Body Bathing Details (indicate cue type and reason): Educated on compensatory strategies for UB bathing Lower Body Bathing: Min guard;Sit to/from stand   Upper Body Dressing : Minimal assistance;Sitting Upper Body Dressing Details (indicate cue type and reason): Educated on compensatory strategies for UB dressing Lower Body Dressing: Min guard;Sit to/from stand   Toilet Transfer: Min guard;Ambulation;Comfort height toilet;Grab bars Toilet Transfer Details (indicate cue type and reason): Only used grab bar on L side to simulate home environment Toileting- Water quality scientist and Hygiene: Supervision/safety;Sitting/lateral lean Toileting - Clothing Manipulation Details (indicate cue type and reason): for peri care   Tub/Shower Transfer Details (indicate cue type and reason): Pt planning to sponge bathe initially Functional mobility during ADLs: Min guard General ADL Comments: Educated pt on sling management and wear schedule, proper positioning RUE in bed/chair, RUE NWB, ice for edema/pain, bed mobility technique, RUE exercises. Pt able to teach back all shoulder education     Vision         Perception     Praxis      Pertinent Vitals/Pain Pain Assessment: Faces  Faces Pain Scale: Hurts little more Pain Location: R shoulder Pain Descriptors / Indicators: Sore;Discomfort Pain Intervention(s): Monitored during  session;Repositioned;Ice applied     Hand Dominance Right   Extremity/Trunk Assessment Upper Extremity Assessment Upper Extremity Assessment: RUE deficits/detail RUE Deficits / Details: Nerve block still intact. Able to minimally wiggle fingers. Hx of neruopathy RUE: Unable to fully assess due to immobilization RUE Sensation: decreased light touch;history of peripheral neuropathy RUE Coordination: decreased fine motor;decreased gross motor   Lower Extremity Assessment Lower Extremity Assessment: Overall WFL for tasks assessed   Cervical / Trunk Assessment Cervical / Trunk Assessment: Kyphotic   Communication Communication Communication: No difficulties   Cognition Arousal/Alertness: Awake/alert Behavior During Therapy: WFL for tasks assessed/performed Overall Cognitive Status: Within Functional Limits for tasks assessed                                     General Comments       Exercises Exercises: Shoulder Shoulder Exercises Elbow Flexion: AAROM;Right;5 reps;Seated Elbow Extension: AAROM;Right;5 reps;Seated Wrist Flexion: AAROM;Right;5 reps;Seated Wrist Extension: AAROM;Right;5 reps;Seated Digit Composite Flexion: AAROM;Right;5 reps;Seated Composite Extension: AAROM;Right;5 reps;Seated   Shoulder Instructions Shoulder Instructions Donning/doffing shirt without moving shoulder: Minimal assistance Method for sponge bathing under operated UE: Minimal assistance Donning/doffing sling/immobilizer: Minimal assistance Correct positioning of sling/immobilizer: Supervision/safety ROM for elbow, wrist and digits of operated UE: Supervision/safety Sling wearing schedule (on at all times/off for ADL's): Supervision/safety Proper positioning of operated UE when showering: Supervision/safety Positioning of UE while sleeping: Minimal assistance    Home Living Family/patient expects to be discharged to:: Private residence Living Arrangements: Alone Available Help  at Discharge: Family;Friend(s);Available PRN/intermittently Type of Home: Apartment Home Access: Elevator     Home Layout: One level     Bathroom Shower/Tub: Tub/shower unit;Curtain   Biochemist, clinical: Standard     Home Equipment: Grab bars - tub/shower   Additional Comments: Pt reports she has a few neighbors who are planning to check on her throughout the day and help take care of her cat      Prior Functioning/Environment Level of Independence: Independent                 OT Problem List: Decreased strength;Decreased range of motion;Impaired balance (sitting and/or standing);Decreased knowledge of use of DME or AE;Decreased knowledge of precautions;Impaired sensation;Impaired UE functional use;Pain;Increased edema      OT Treatment/Interventions: Self-care/ADL training;Therapeutic exercise;DME and/or AE instruction;Therapeutic activities;Patient/family education;Balance training    OT Goals(Current goals can be found in the care plan section) Acute Rehab OT Goals Patient Stated Goal: return home OT Goal Formulation: With patient Time For Goal Achievement: 05/12/17 Potential to Achieve Goals: Good ADL Goals Pt Will Perform Upper Body Bathing: with supervision;sitting Pt Will Perform Upper Body Dressing: with supervision;sitting Pt Will Perform Tub/Shower Transfer: Tub transfer;with supervision;ambulating;shower seat;grab bars Additional ADL Goal #1: Pt will independently perform RUE elbow/wrist/hand exercises.  OT Frequency: Min 3X/week   Barriers to D/C: Decreased caregiver support  pt lives alone       Co-evaluation              AM-PAC PT "6 Clicks" Daily Activity     Outcome Measure Help from another person eating meals?: A Little Help from another person taking care of personal grooming?: A Little Help from another person toileting, which includes using toliet, bedpan, or urinal?: A Little Help from another person bathing (including washing, rinsing,  drying)?:  A Little Help from another person to put on and taking off regular upper body clothing?: A Little Help from another person to put on and taking off regular lower body clothing?: A Little 6 Click Score: 18   End of Session Equipment Utilized During Treatment: Other (comment)(sling) Nurse Communication: Mobility status  Activity Tolerance: Patient tolerated treatment well Patient left: in chair;with call bell/phone within reach  OT Visit Diagnosis: Unsteadiness on feet (R26.81);History of falling (Z91.81);Pain Pain - Right/Left: Right Pain - part of body: Shoulder                Time: 1219-7588 OT Time Calculation (min): 25 min Charges:  OT General Charges $OT Visit: 1 Visit OT Evaluation $OT Eval Moderate Complexity: 1 Mod OT Treatments $Self Care/Home Management : 8-22 mins G-Codes:     Kelcey Korus A. Ulice Brilliant, M.S., OTR/L Pager: [place] 04/28/2017, 9:16 AM

## 2017-04-28 NOTE — Consult Note (Signed)
[facility] CM Primary Care Navigator  04/28/2017  Brooke Deleon Sep 21, 1955 [id number]   Met withpatient and close friend (Brooke Deleon)at the bedsideto identify possible discharge needs. Patientreports having significant "pain and limited movement to right arm/ shoulder" that failed conservative measures, which led to this admission/ surgery.  Patientendorses Dr.Dhruv Vyass with [facility] as herprimary care provider.   [facility] [place] [facility].   Patient reportsmanagingher ownmedications at [employer] out of the containers with her own "check off system".  Patient statesthatshe uses [employer] transportation for disability [employer]);  but friend [name], academic) or [name] can also providetransportation to herdoctors'appointmentsafter discharge.  Patient verbalizedthat her friends Brooke Deleon) will be her caregivers who will provide assistance with her careat home as stated.  Anticipateddischarge planishomeaccording to patient, likely with Outpatient rehab as directed by ortho surgeon.  Patientvoiced understanding to call primary care provider's office when she returns homefor a post discharge follow-up visit within1- 2weeksor sooner if needs arise.Patient letter (with PCP's contact number) was provided as areminder.   Explained to patient and friend regarding [facility] CM services available for health management at home, but she states "managing so far without current needs or concerns" at this point. Discussed COPD action plan with teach back method.  Patient had declinedservices and EMMI calls to follow-uprecoveryas well, stating that she has support from friends in managing her care needs and close follow-up with providers. Encouraged patientto seekreferral from primary care provider to [facility] care management if  deemednecessaryand appropriate for services in the future.  [facility] care management information was provided for future needs that she may have.   For additional questions please contact:  [name], BSN, RN-BC [facility] PRIMARY CARE Navigator Cell: [phone]

## 2017-04-28 NOTE — Progress Notes (Signed)
   PATIENT ID: Brooke Deleon   1 Day Post-Op Procedure(s) (LRB): RIGHT REVERSE TOTAL SHOULDER ARTHROPLASTY (Right)  Subjective: Doing well, shoulder block still working, minimal pain. No complaints. Working on arrangements to go home today.   Objective:  Vitals:   04/28/17 0141 04/28/17 0538  BP: 107/61 (!) 168/90  Pulse: 70 75  Resp: 18 19  Temp: 98.2 F (36.8 C) 98.7 F (37.1 C)  SpO2: 94% 98%     R UE dressing c/d/i Fingers still numb light touch, some wiggling  Labs:  Recent Labs    04/27/17 0834 04/28/17 0517  HGB 14.3 11.9*   Recent Labs    04/27/17 0834 04/28/17 0517  WBC 8.4 13.8*  RBC 4.72 3.98  HCT 44.6 37.3  PLT 286 231   Recent Labs    04/27/17 0834 04/28/17 0517  NA 135 136  K 3.4* 3.8  CL 99* 100*  CO2 25 29  BUN 8 5*  CREATININE 0.88 0.81  GLUCOSE 75 136*  CALCIUM 9.2 8.7*    Assessment and Plan: 1 day s/p R reverse TSA OT- hand wrist elbow ROM only D/c home today when cleared by OT Scripts signed in chart Fu w Dr. Tamera Punt in 2 weeks  VTE proph: asa, scds   Anticipated LOS equal to or greater than 2 midnights due to - Age 61 and older with one or more of the following:  - Obesity  - Expected need for hospital services (PT, OT, Nursing) required for safe  discharge  - Anticipated need for postoperative skilled nursing care or inpatient rehab  - Active co-morbidities: None OR   - Unanticipated findings during/Post Surgery: None  - Patient is a high risk of re-admission due to: None

## 2017-05-02 ENCOUNTER — Encounter: Payer: Self-pay | Admitting: Internal Medicine

## 2017-05-12 DIAGNOSIS — M19011 Primary osteoarthritis, right shoulder: Secondary | ICD-10-CM | POA: Diagnosis not present

## 2017-05-17 ENCOUNTER — Ambulatory Visit: Payer: PPO | Admitting: Urology

## 2017-06-01 DIAGNOSIS — M545 Low back pain: Secondary | ICD-10-CM | POA: Diagnosis not present

## 2017-06-01 DIAGNOSIS — Z79891 Long term (current) use of opiate analgesic: Secondary | ICD-10-CM | POA: Diagnosis not present

## 2017-06-01 DIAGNOSIS — M25519 Pain in unspecified shoulder: Secondary | ICD-10-CM | POA: Diagnosis not present

## 2017-06-01 DIAGNOSIS — M797 Fibromyalgia: Secondary | ICD-10-CM | POA: Diagnosis not present

## 2017-06-07 DIAGNOSIS — M25519 Pain in unspecified shoulder: Secondary | ICD-10-CM | POA: Diagnosis not present

## 2017-06-07 DIAGNOSIS — M545 Low back pain: Secondary | ICD-10-CM | POA: Diagnosis not present

## 2017-06-09 ENCOUNTER — Ambulatory Visit: Payer: PPO | Admitting: Gastroenterology

## 2017-06-16 DIAGNOSIS — M19011 Primary osteoarthritis, right shoulder: Secondary | ICD-10-CM | POA: Diagnosis not present

## 2017-06-27 IMAGING — MR MR KNEE*R* W/O CM
4 of 6 series · 16 of 40 positions shown · non-contrast
Comparison: Plain films right knee 04/26/2016 performed at an
outside facility.

CLINICAL DATA: Right knee pain for 1 month since a fall.

EXAM:
MRI OF THE RIGHT KNEE WITHOUT CONTRAST
TECHNIQUE: Multiplanar, multisequence MR imaging of the knee was performed. No
intravenous contrast was administered.

[Series 5: pdfs sag · sagittal · 3.0mm · 0.21mm/px · 3 of 32 slices shown]
[im 6/32]
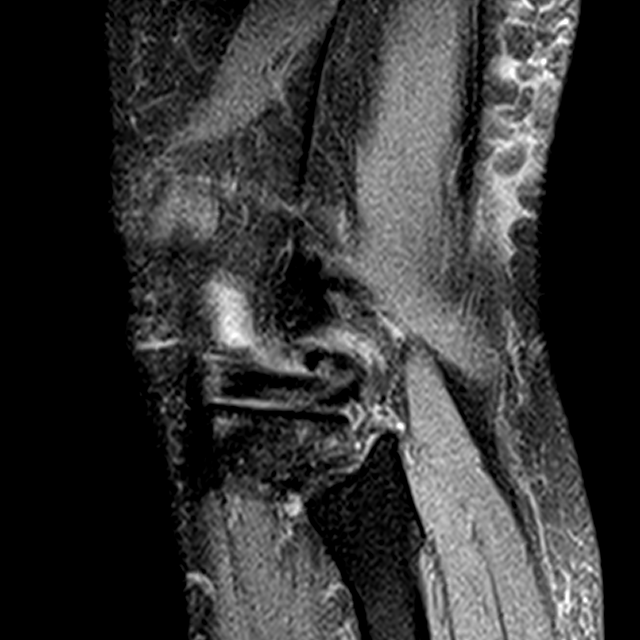
[im 16/32]
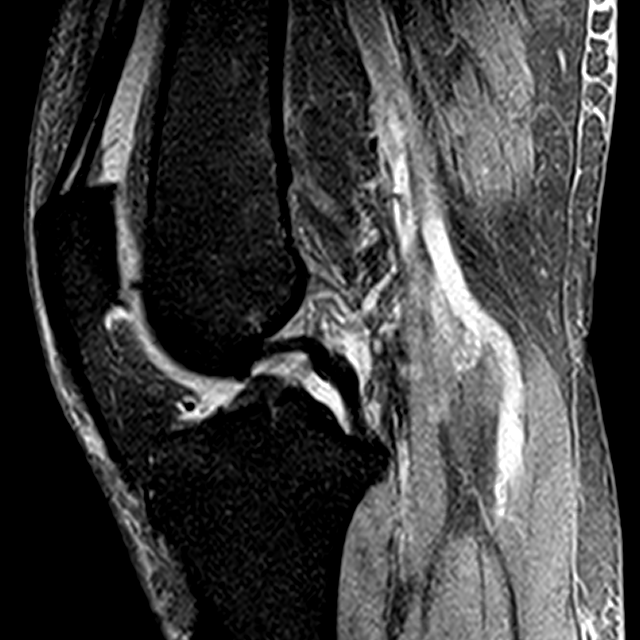
[im 26/32]
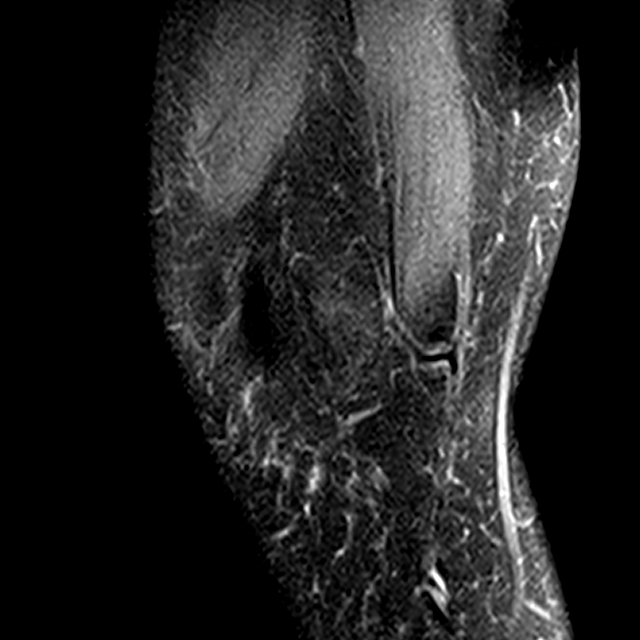

[Series 6: t2fs cor · coronal · 3.0mm · 0.24mm/px · 3 of 24 slices shown]
[im 5/24]
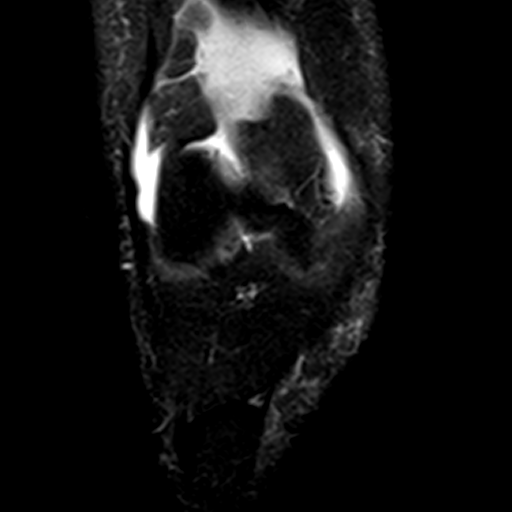
[im 14/24]
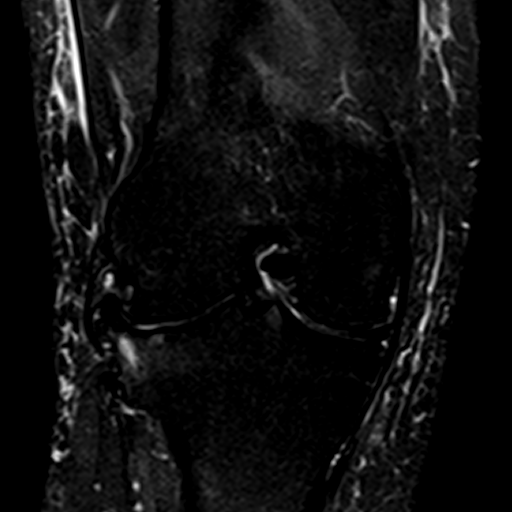
[im 24/24]
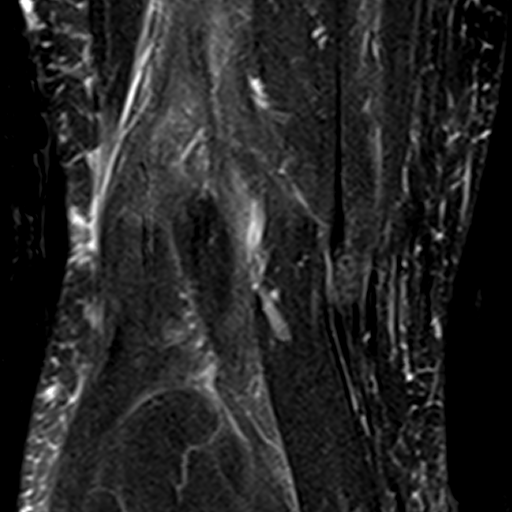

[Series 7: pdfs cor blade · coronal · 3.0mm · 0.52mm/px · 3 of 28 slices shown]
[im 5/28]
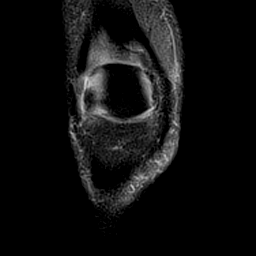
[im 14/28]
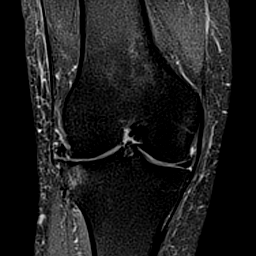
[im 23/28]
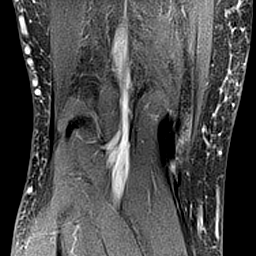

[Series 10: T1 · coronal · 3.0mm · 0.19mm/px · 7 of 30 slices shown]
[im 1/30]
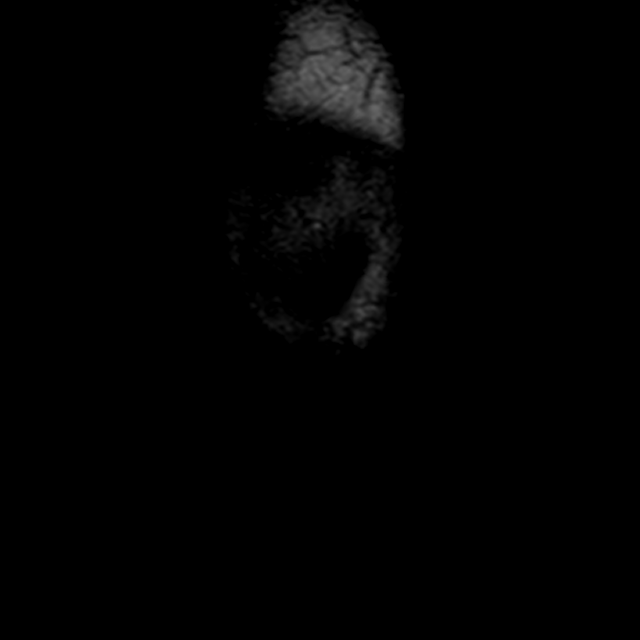
[im 5/30]
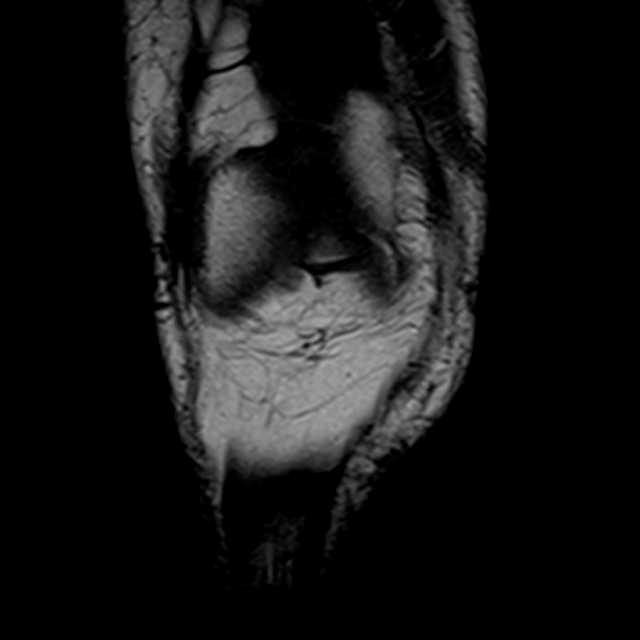
[im 9/30]
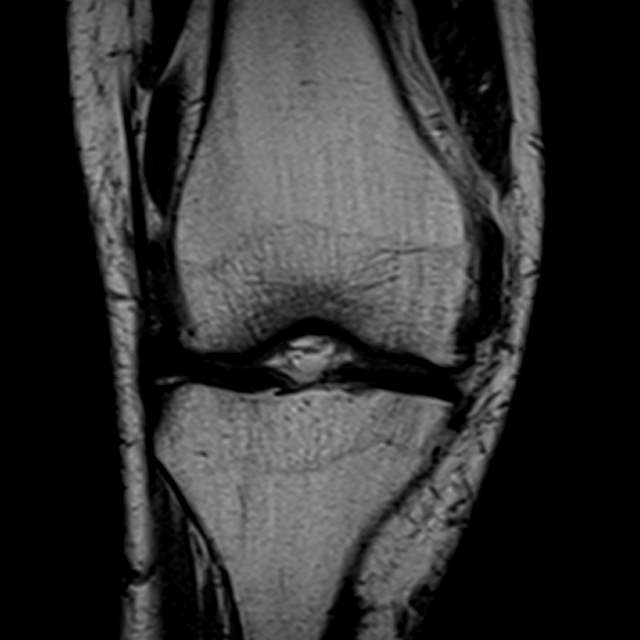
[im 13/30]
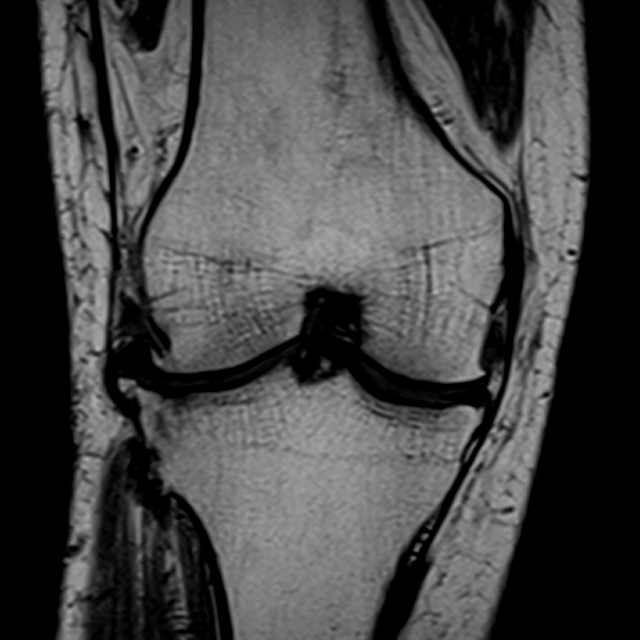
[im 17/30]
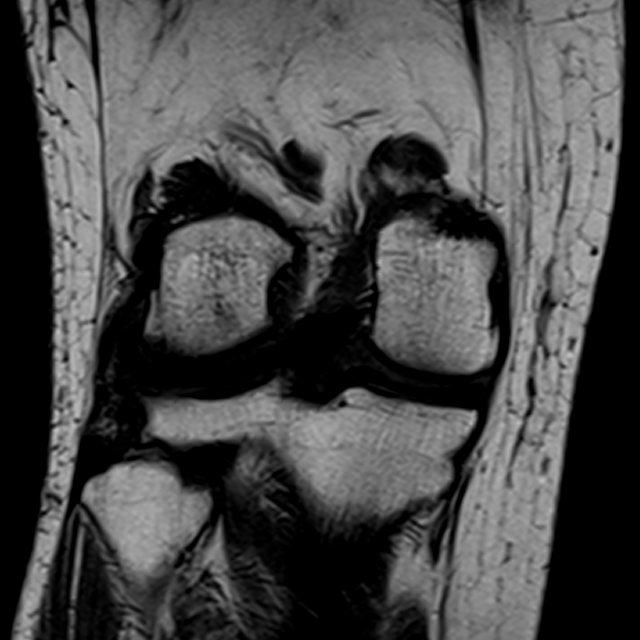
[im 21/30]
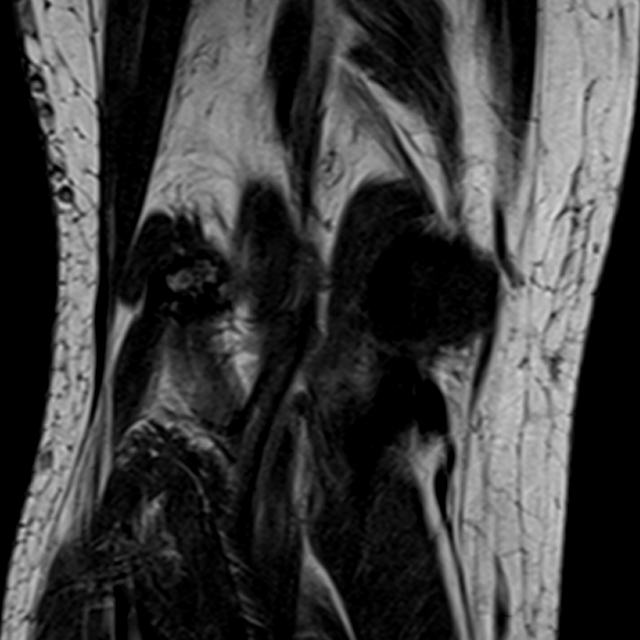
[im 25/30]
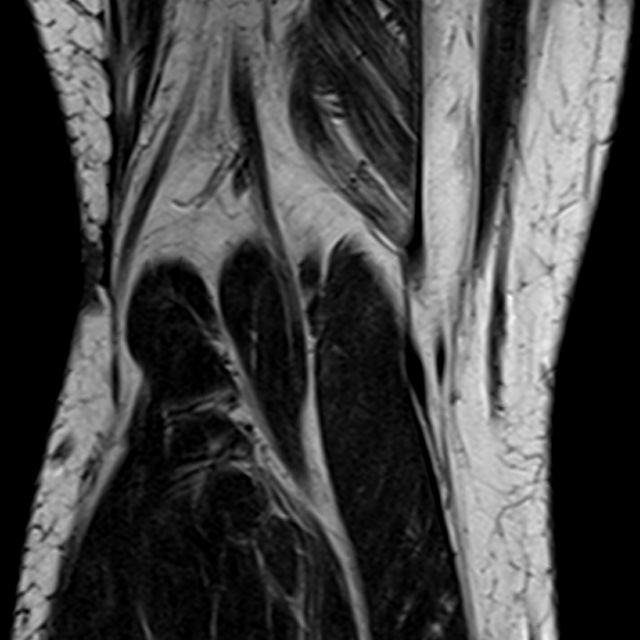

[16 of 40 positions shown; findings below may reference images not displayed]

FINDINGS: MENISCI

Medial meniscus: Horizontal tear in the posterior horn reaches the
meniscal undersurface.

Lateral meniscus: The body is degenerated and extruded peripherally.
Horizontal tear in the body reaches the meniscal undersurface.

LIGAMENTS

Cruciates:  Intact.

Collaterals:  Intact.

CARTILAGE

Patellofemoral:  Minimally degenerated.

Medial:  Mildly degenerated.

Lateral:  Thinned with associated joint space narrowing.

Joint:  Small effusion.

Popliteal Fossa:  No Baker's cyst.

Extensor Mechanism:  Intact.

Bones: No fracture or worrisome lesion. Osteophytosis about the knee
is worst laterally. There is some subchondral edema in the periphery
of the lateral tibial plateau.

Other: None.
IMPRESSION: Horizontal tear posterior horn medial meniscus.

The body of the lateral meniscus is markedly degenerated and
extruded peripherally out of the joint. Horizontal tear throughout
the body reaches the meniscal undersurface.

Osteoarthritis about the knee appearing worst in the lateral
compartment

## 2017-06-29 ENCOUNTER — Encounter: Payer: Self-pay | Admitting: Gastroenterology

## 2017-06-29 ENCOUNTER — Telehealth: Payer: Self-pay | Admitting: *Deleted

## 2017-06-29 ENCOUNTER — Other Ambulatory Visit: Payer: Self-pay | Admitting: *Deleted

## 2017-06-29 ENCOUNTER — Ambulatory Visit (INDEPENDENT_AMBULATORY_CARE_PROVIDER_SITE_OTHER): Payer: PPO | Admitting: Gastroenterology

## 2017-06-29 ENCOUNTER — Encounter: Payer: Self-pay | Admitting: *Deleted

## 2017-06-29 VITALS — BP 133/85 | HR 67 | Temp 97.9°F | Ht 60.0 in | Wt 134.8 lb

## 2017-06-29 DIAGNOSIS — R195 Other fecal abnormalities: Secondary | ICD-10-CM

## 2017-06-29 DIAGNOSIS — K5903 Drug induced constipation: Secondary | ICD-10-CM | POA: Diagnosis not present

## 2017-06-29 DIAGNOSIS — M25519 Pain in unspecified shoulder: Secondary | ICD-10-CM | POA: Diagnosis not present

## 2017-06-29 DIAGNOSIS — M545 Low back pain: Secondary | ICD-10-CM | POA: Diagnosis not present

## 2017-06-29 DIAGNOSIS — M797 Fibromyalgia: Secondary | ICD-10-CM | POA: Diagnosis not present

## 2017-06-29 DIAGNOSIS — Z79891 Long term (current) use of opiate analgesic: Secondary | ICD-10-CM | POA: Diagnosis not present

## 2017-06-29 MED ORDER — PEG 3350-KCL-NA BICARB-NACL 420 G PO SOLR: 4000.0000 mL | Freq: Once | ORAL | 0 refills | Status: AC

## 2017-06-29 MED ORDER — LUBIPROSTONE 24 MCG PO CAPS: 24.0000 ug | ORAL_CAPSULE | Freq: Two times a day (BID) | ORAL | 5 refills | Status: DC

## 2017-06-29 NOTE — Telephone Encounter (Signed)
Pre-op scheduled for 08/17/17 at 9:00am. Patient aware. Letter mailed.

## 2017-06-29 NOTE — Patient Instructions (Addendum)
1. Colonoscopy as scheduled. See separate instructions. 2. Trial of Amitiza 71mcg twice a day for constipation. Call if too expensive.
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
## 2017-06-30 ENCOUNTER — Encounter: Payer: Self-pay | Admitting: Gastroenterology

## 2017-06-30 NOTE — Progress Notes (Signed)
cc'd to pcp 

## 2017-06-30 NOTE — Assessment & Plan Note (Signed)
62 year old female who presents for Hemoccult positive stool.  Baseline constipation in the setting of opioids.  Denies overt GI bleeding.  Last colonoscopy 2014.  Recommend colonoscopy for further evaluation.  Deep sedation given polypharmacy.  I have discussed the risks, alternatives, benefits with regards to but not limited to the risk of reaction to medication, bleeding, infection, perforation and the patient is agreeable to proceed. Written consent to be obtained.   For constipation, begin Amitiza 24 mcg twice daily.  She will call if it is ineffective or too expensive.  Discussed need to correct constipation prior to colonoscopy.  She plans to postpone colonoscopy to July because she is recovering from shoulder replacement.

## 2017-07-18 DIAGNOSIS — G629 Polyneuropathy, unspecified: Secondary | ICD-10-CM | POA: Diagnosis not present

## 2017-07-18 DIAGNOSIS — F17210 Nicotine dependence, cigarettes, uncomplicated: Secondary | ICD-10-CM | POA: Diagnosis not present

## 2017-07-18 DIAGNOSIS — T7840XA Allergy, unspecified, initial encounter: Secondary | ICD-10-CM | POA: Diagnosis not present

## 2017-07-18 DIAGNOSIS — L0502 Pilonidal sinus with abscess: Secondary | ICD-10-CM | POA: Diagnosis not present

## 2017-07-18 DIAGNOSIS — I10 Essential (primary) hypertension: Secondary | ICD-10-CM | POA: Diagnosis not present

## 2017-07-18 DIAGNOSIS — F329 Major depressive disorder, single episode, unspecified: Secondary | ICD-10-CM | POA: Diagnosis not present

## 2017-07-18 DIAGNOSIS — Z299 Encounter for prophylactic measures, unspecified: Secondary | ICD-10-CM | POA: Diagnosis not present

## 2017-07-18 DIAGNOSIS — Z6827 Body mass index (BMI) 27.0-27.9, adult: Secondary | ICD-10-CM | POA: Diagnosis not present

## 2017-07-25 DIAGNOSIS — L0591 Pilonidal cyst without abscess: Secondary | ICD-10-CM | POA: Diagnosis not present

## 2017-07-25 DIAGNOSIS — Z79899 Other long term (current) drug therapy: Secondary | ICD-10-CM | POA: Diagnosis not present

## 2017-07-25 DIAGNOSIS — E2839 Other primary ovarian failure: Secondary | ICD-10-CM | POA: Diagnosis not present

## 2017-07-25 DIAGNOSIS — K436 Other and unspecified ventral hernia with obstruction, without gangrene: Secondary | ICD-10-CM | POA: Diagnosis not present

## 2017-07-27 DIAGNOSIS — Z1231 Encounter for screening mammogram for malignant neoplasm of breast: Secondary | ICD-10-CM | POA: Diagnosis not present

## 2017-08-02 DIAGNOSIS — L0591 Pilonidal cyst without abscess: Secondary | ICD-10-CM | POA: Diagnosis not present

## 2017-08-04 DIAGNOSIS — Z79891 Long term (current) use of opiate analgesic: Secondary | ICD-10-CM | POA: Diagnosis not present

## 2017-08-04 DIAGNOSIS — M545 Low back pain: Secondary | ICD-10-CM | POA: Diagnosis not present

## 2017-08-04 DIAGNOSIS — M797 Fibromyalgia: Secondary | ICD-10-CM | POA: Diagnosis not present

## 2017-08-04 DIAGNOSIS — M25519 Pain in unspecified shoulder: Secondary | ICD-10-CM | POA: Diagnosis not present

## 2017-08-07 DIAGNOSIS — Z87891 Personal history of nicotine dependence: Secondary | ICD-10-CM | POA: Diagnosis not present

## 2017-08-07 DIAGNOSIS — M797 Fibromyalgia: Secondary | ICD-10-CM | POA: Diagnosis not present

## 2017-08-07 DIAGNOSIS — L0591 Pilonidal cyst without abscess: Secondary | ICD-10-CM | POA: Diagnosis not present

## 2017-08-07 DIAGNOSIS — Z91040 Latex allergy status: Secondary | ICD-10-CM | POA: Diagnosis not present

## 2017-08-07 DIAGNOSIS — E669 Obesity, unspecified: Secondary | ICD-10-CM | POA: Diagnosis not present

## 2017-08-07 DIAGNOSIS — G629 Polyneuropathy, unspecified: Secondary | ICD-10-CM | POA: Diagnosis not present

## 2017-08-07 DIAGNOSIS — F419 Anxiety disorder, unspecified: Secondary | ICD-10-CM | POA: Diagnosis not present

## 2017-08-07 DIAGNOSIS — J449 Chronic obstructive pulmonary disease, unspecified: Secondary | ICD-10-CM | POA: Diagnosis not present

## 2017-08-07 DIAGNOSIS — K219 Gastro-esophageal reflux disease without esophagitis: Secondary | ICD-10-CM | POA: Diagnosis not present

## 2017-08-07 DIAGNOSIS — Z6825 Body mass index (BMI) 25.0-25.9, adult: Secondary | ICD-10-CM | POA: Diagnosis not present

## 2017-08-07 DIAGNOSIS — Z886 Allergy status to analgesic agent status: Secondary | ICD-10-CM | POA: Diagnosis not present

## 2017-08-07 DIAGNOSIS — I10 Essential (primary) hypertension: Secondary | ICD-10-CM | POA: Diagnosis not present

## 2017-08-07 DIAGNOSIS — F329 Major depressive disorder, single episode, unspecified: Secondary | ICD-10-CM | POA: Diagnosis not present

## 2017-08-08 DIAGNOSIS — L0591 Pilonidal cyst without abscess: Secondary | ICD-10-CM | POA: Diagnosis not present

## 2017-08-14 NOTE — Telephone Encounter (Signed)
Patient had a cyst removed recently and she would like to wait to have her tcs.  She will call later to reschedule.   Hoyle Brooke Deleon notified of the cancellation.

## 2017-08-17 ENCOUNTER — Inpatient Hospital Stay (HOSPITAL_COMMUNITY): Admission: RE | Admit: 2017-08-17 | Payer: PPO | Source: Ambulatory Visit

## 2017-08-20 ENCOUNTER — Encounter (HOSPITAL_COMMUNITY): Payer: Self-pay | Admitting: *Deleted

## 2017-08-20 ENCOUNTER — Other Ambulatory Visit: Payer: Self-pay

## 2017-08-20 ENCOUNTER — Emergency Department (HOSPITAL_COMMUNITY)
Admission: EM | Admit: 2017-08-20 | Discharge: 2017-08-20 | Disposition: A | Discharge status: Home / Self Care | Payer: PPO | Attending: Emergency Medicine | Admitting: Emergency Medicine

## 2017-08-20 DIAGNOSIS — F17210 Nicotine dependence, cigarettes, uncomplicated: Secondary | ICD-10-CM | POA: Insufficient documentation

## 2017-08-20 DIAGNOSIS — I10 Essential (primary) hypertension: Secondary | ICD-10-CM | POA: Diagnosis not present

## 2017-08-20 DIAGNOSIS — R21 Rash and other nonspecific skin eruption: Secondary | ICD-10-CM | POA: Diagnosis present

## 2017-08-20 DIAGNOSIS — L509 Urticaria, unspecified: Secondary | ICD-10-CM | POA: Diagnosis not present

## 2017-08-20 DIAGNOSIS — Z79899 Other long term (current) drug therapy: Secondary | ICD-10-CM | POA: Insufficient documentation

## 2017-08-20 DIAGNOSIS — J449 Chronic obstructive pulmonary disease, unspecified: Secondary | ICD-10-CM | POA: Diagnosis not present

## 2017-08-20 MED ORDER — DIPHENHYDRAMINE HCL 25 MG PO TABS: 25.0000 mg | ORAL_TABLET | Freq: Four times a day (QID) | ORAL | 0 refills | Status: DC

## 2017-08-20 MED ORDER — DIPHENHYDRAMINE HCL 25 MG PO CAPS
25.0000 mg | ORAL_CAPSULE | Freq: Once | ORAL | Status: AC
  Administered 2017-08-20: 25 mg via ORAL
  Filled 2017-08-20: qty 1

## 2017-08-20 MED ORDER — FAMOTIDINE 20 MG PO TABS
20.0000 mg | ORAL_TABLET | Freq: Once | ORAL | Status: AC
  Administered 2017-08-20: 20 mg via ORAL
  Filled 2017-08-20: qty 1

## 2017-08-20 MED ORDER — FAMOTIDINE 20 MG PO TABS: 20.0000 mg | ORAL_TABLET | Freq: Two times a day (BID) | ORAL | 0 refills | Status: DC

## 2017-08-20 MED ORDER — DEXAMETHASONE 10 MG/ML FOR PEDIATRIC ORAL USE
16.0000 mg | Freq: Once | INTRAMUSCULAR | Status: AC
  Administered 2017-08-20: 16 mg via ORAL
  Filled 2017-08-20: qty 2
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
## 2017-08-20 NOTE — ED Triage Notes (Signed)
Pt c/o rash that started yesterday,

## 2017-08-21 ENCOUNTER — Other Ambulatory Visit: Payer: Self-pay | Admitting: *Deleted

## 2017-08-21 NOTE — Patient Outreach (Signed)
[place] [facility]) Care Management  08/21/2017  Brooke Deleon 02/28/1955 [id number]   Telephone Screen  Referral Date: 08/15/17 Referral Source: HTA referral Referral Reason: member requested assistance with help paying for incontinence supplies Insurance: HTA  Outreach attempt #1 . No answer. [facility] RN CM left HIPAA compliant voicemail message along with CM's contact info.   Plan: [facility] RN CM sent an unsuccessful outreach letter and scheduled this patient for another call attempt within 4 business days  Isa Hitz L. Lavina Hamman, RN, BSN, CCM [facility] Telephonic Care Management Care Coordinator Direct number [phone]  Main [facility] number [phone] Fax number [phone]

## 2017-08-22 ENCOUNTER — Other Ambulatory Visit: Payer: Self-pay | Admitting: *Deleted

## 2017-08-22 DIAGNOSIS — L0591 Pilonidal cyst without abscess: Secondary | ICD-10-CM | POA: Diagnosis not present

## 2017-08-22 NOTE — Patient Outreach (Signed)
[place] [facility]) Care Management  08/22/2017  TZIPPORAH NAGORSKI May 25, 1955 [id number]   Telephone Screen  Referral Date: 08/15/17 Referral Source: HTA referral Referral Reason: member requested assistance with help paying for incontinence supplies Insurance: HTA  Outreach attempt # 2 . No answer. [facility] RN CM left HIPAA compliant voicemail message along with CM's contact info.   Plan: [facility] RN CM scheduled this patient for another call attempt within 4 business days  Rathana Viveros L. Lavina Hamman, RN, BSN, CCM [facility] Telephonic Care Management Care Coordinator Direct number [phone]  Main [facility] number [phone] Fax number [phone]

## 2017-08-24 ENCOUNTER — Ambulatory Visit (HOSPITAL_COMMUNITY): Admission: RE | Admit: 2017-08-24 | Payer: PPO | Source: Ambulatory Visit | Admitting: Internal Medicine

## 2017-08-24 ENCOUNTER — Encounter (HOSPITAL_COMMUNITY): Admission: RE | Payer: Self-pay | Source: Ambulatory Visit

## 2017-08-24 SURGERY — COLONOSCOPY WITH PROPOFOL
Anesthesia: Monitor Anesthesia Care

## 2017-08-25 ENCOUNTER — Ambulatory Visit: Payer: Self-pay | Admitting: *Deleted

## 2017-08-28 ENCOUNTER — Other Ambulatory Visit: Payer: Self-pay

## 2017-08-28 ENCOUNTER — Other Ambulatory Visit: Payer: Self-pay | Admitting: *Deleted

## 2017-08-28 ENCOUNTER — Encounter (HOSPITAL_COMMUNITY): Payer: Self-pay | Admitting: Emergency Medicine

## 2017-08-28 ENCOUNTER — Emergency Department (HOSPITAL_COMMUNITY)
Admission: EM | Admit: 2017-08-28 | Discharge: 2017-08-28 | Disposition: A | Discharge status: Home / Self Care | Payer: PPO | Attending: Emergency Medicine | Admitting: Emergency Medicine

## 2017-08-28 DIAGNOSIS — I10 Essential (primary) hypertension: Secondary | ICD-10-CM | POA: Diagnosis not present

## 2017-08-28 DIAGNOSIS — Z79899 Other long term (current) drug therapy: Secondary | ICD-10-CM | POA: Diagnosis not present

## 2017-08-28 DIAGNOSIS — J45909 Unspecified asthma, uncomplicated: Secondary | ICD-10-CM | POA: Insufficient documentation

## 2017-08-28 DIAGNOSIS — R21 Rash and other nonspecific skin eruption: Secondary | ICD-10-CM | POA: Insufficient documentation

## 2017-08-28 DIAGNOSIS — F17210 Nicotine dependence, cigarettes, uncomplicated: Secondary | ICD-10-CM | POA: Diagnosis not present

## 2017-08-28 MED ORDER — FAMOTIDINE 20 MG PO TABS
20.0000 mg | ORAL_TABLET | Freq: Once | ORAL | Status: AC
  Administered 2017-08-28: 20 mg via ORAL
  Filled 2017-08-28: qty 1

## 2017-08-28 MED ORDER — HYDROXYZINE HCL 50 MG/ML IM SOLN
25.0000 mg | Freq: Once | INTRAMUSCULAR | Status: AC
Start: 2017-08-28 — End: 2017-08-28
  Administered 2017-08-28: 25 mg via INTRAMUSCULAR
  Filled 2017-08-28: qty 1

## 2017-08-28 MED ORDER — FAMOTIDINE 20 MG PO TABS: 20.0000 mg | ORAL_TABLET | Freq: Two times a day (BID) | ORAL | 0 refills | Status: DC

## 2017-08-28 MED ORDER — HYDROXYZINE HCL 25 MG PO TABS: 25.0000 mg | ORAL_TABLET | Freq: Four times a day (QID) | ORAL | 0 refills | Status: DC

## 2017-08-28 NOTE — ED Triage Notes (Signed)
Pt reports intermittent rash that goes away and moves areas. Denies new laundry detergents, soaps, lotions, or medications.

## 2017-08-28 NOTE — Discharge Instructions (Addendum)
As discussed, it would make sense that you might be reacting to your dilaudid medication since you started itching when this medicine was increased.  Cut back to the 2 mg strength tablet to see if your itching improves.  In the interim, continue taking pepcid and try the hydroxyzine prescribed in place of benadryl which may give you better itch relief.  You can also take claritin or zyrtec once daily.

## 2017-08-29 NOTE — Patient Outreach (Signed)
[place] [facility]) Care Management  08/29/2017  KINZE LABO January 21, 1956 [id number]   Telephone Screen  Referral Date:08/15/17 Referral Source:HTA referral Referral Reason:member requested assistance with help paying for incontinence supplies Insurance:HTA  Outreach attempt # 3. No answer at the only number listed for Brooke Deleon in [place]. [facility] RN CM left HIPAA compliant voicemail message along with CM's contact info.    Brooke Hanratty has been hospitalized x 1 and to ED x 2 in the last 6 months Last ED visit listed on 08/28/17 for an intermittent rash   Plan: [facility] RN CM will prepare for case closure per call attempts workflow if no return call from this patient.  An unsuccessful outreach letter was sent on 08/21/17. Multiple attempts to establish contact with patient without success. No response from letter mailed to patient.   Kimberly L. Lavina Hamman, RN, BSN, CCM [facility] Telephonic Care Management Care Coordinator Direct number [phone]  Main [facility] number [phone] Fax number [phone]
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
## 2017-09-01 ENCOUNTER — Other Ambulatory Visit: Payer: Self-pay | Admitting: *Deleted

## 2017-09-01 NOTE — Patient Outreach (Signed)
[facility]) Care Management  09/01/2017  RIANNON MUKHERJEE 03/28/1955 [id number]   Case closure   Call attempts made on 7//15/19, 08/23/17 and 08/29/17 Unsuccessful outreach letter sent on 08/21/17 without a response   Plan [facility] RN CM will close case after no response from patient within 10 business days. Unable to reach   [place]. Lavina Hamman, RN, BSN, CCM [facility] Telephonic Care Management Care Coordinator Direct number [phone]  Main [facility] number [phone] Fax number [phone]

## 2017-09-04 DIAGNOSIS — Z79891 Long term (current) use of opiate analgesic: Secondary | ICD-10-CM | POA: Diagnosis not present

## 2017-09-04 DIAGNOSIS — M25519 Pain in unspecified shoulder: Secondary | ICD-10-CM | POA: Diagnosis not present

## 2017-09-04 DIAGNOSIS — M797 Fibromyalgia: Secondary | ICD-10-CM | POA: Diagnosis not present

## 2017-09-04 DIAGNOSIS — M545 Low back pain: Secondary | ICD-10-CM | POA: Diagnosis not present

## 2017-09-18 DIAGNOSIS — L0591 Pilonidal cyst without abscess: Secondary | ICD-10-CM | POA: Diagnosis not present

## 2017-10-11 DIAGNOSIS — M545 Low back pain: Secondary | ICD-10-CM | POA: Diagnosis not present

## 2017-10-31 DIAGNOSIS — I10 Essential (primary) hypertension: Secondary | ICD-10-CM | POA: Diagnosis not present

## 2017-10-31 DIAGNOSIS — L509 Urticaria, unspecified: Secondary | ICD-10-CM | POA: Diagnosis not present

## 2017-10-31 DIAGNOSIS — E663 Overweight: Secondary | ICD-10-CM | POA: Diagnosis not present

## 2017-10-31 DIAGNOSIS — G629 Polyneuropathy, unspecified: Secondary | ICD-10-CM | POA: Diagnosis not present

## 2017-10-31 DIAGNOSIS — Z299 Encounter for prophylactic measures, unspecified: Secondary | ICD-10-CM | POA: Diagnosis not present

## 2017-10-31 DIAGNOSIS — Z2821 Immunization not carried out because of patient refusal: Secondary | ICD-10-CM | POA: Diagnosis not present

## 2017-11-01 DIAGNOSIS — M545 Low back pain: Secondary | ICD-10-CM | POA: Diagnosis not present

## 2017-11-01 DIAGNOSIS — M797 Fibromyalgia: Secondary | ICD-10-CM | POA: Diagnosis not present

## 2017-11-01 DIAGNOSIS — M25519 Pain in unspecified shoulder: Secondary | ICD-10-CM | POA: Diagnosis not present

## 2017-11-01 DIAGNOSIS — Z79891 Long term (current) use of opiate analgesic: Secondary | ICD-10-CM | POA: Diagnosis not present

## 2017-11-10 ENCOUNTER — Other Ambulatory Visit: Payer: Self-pay | Admitting: Orthopedic Surgery

## 2017-11-14 ENCOUNTER — Other Ambulatory Visit: Payer: Self-pay

## 2017-11-14 DIAGNOSIS — Z6826 Body mass index (BMI) 26.0-26.9, adult: Secondary | ICD-10-CM | POA: Diagnosis not present

## 2017-11-14 DIAGNOSIS — R21 Rash and other nonspecific skin eruption: Secondary | ICD-10-CM | POA: Diagnosis not present

## 2017-11-14 DIAGNOSIS — Z299 Encounter for prophylactic measures, unspecified: Secondary | ICD-10-CM | POA: Diagnosis not present

## 2017-11-14 DIAGNOSIS — Z713 Dietary counseling and surveillance: Secondary | ICD-10-CM | POA: Diagnosis not present

## 2017-11-14 DIAGNOSIS — I10 Essential (primary) hypertension: Secondary | ICD-10-CM | POA: Diagnosis not present

## 2017-11-15 ENCOUNTER — Other Ambulatory Visit: Payer: Self-pay | Admitting: Orthopedic Surgery

## 2017-11-16 DIAGNOSIS — L508 Other urticaria: Secondary | ICD-10-CM | POA: Diagnosis not present

## 2017-11-18 MED ORDER — PANTOPRAZOLE SODIUM 40 MG PO TBEC
DELAYED_RELEASE_TABLET | ORAL | 5 refills | Status: DC
Start: 2017-11-18 — End: 2018-07-17

## 2018-01-01 DIAGNOSIS — M797 Fibromyalgia: Secondary | ICD-10-CM | POA: Diagnosis not present

## 2018-01-01 DIAGNOSIS — M545 Low back pain: Secondary | ICD-10-CM | POA: Diagnosis not present

## 2018-01-01 DIAGNOSIS — Z79891 Long term (current) use of opiate analgesic: Secondary | ICD-10-CM | POA: Diagnosis not present

## 2018-01-01 DIAGNOSIS — M25519 Pain in unspecified shoulder: Secondary | ICD-10-CM | POA: Diagnosis not present

## 2018-01-25 DIAGNOSIS — Z79891 Long term (current) use of opiate analgesic: Secondary | ICD-10-CM | POA: Diagnosis not present

## 2018-01-25 DIAGNOSIS — M545 Low back pain: Secondary | ICD-10-CM | POA: Diagnosis not present

## 2018-01-25 DIAGNOSIS — M797 Fibromyalgia: Secondary | ICD-10-CM | POA: Diagnosis not present

## 2018-01-25 DIAGNOSIS — M25519 Pain in unspecified shoulder: Secondary | ICD-10-CM | POA: Diagnosis not present

## 2018-03-05 DIAGNOSIS — Z79891 Long term (current) use of opiate analgesic: Secondary | ICD-10-CM | POA: Diagnosis not present

## 2018-03-05 DIAGNOSIS — M25519 Pain in unspecified shoulder: Secondary | ICD-10-CM | POA: Diagnosis not present

## 2018-03-05 DIAGNOSIS — M797 Fibromyalgia: Secondary | ICD-10-CM | POA: Diagnosis not present

## 2018-03-05 DIAGNOSIS — M545 Low back pain: Secondary | ICD-10-CM | POA: Diagnosis not present

## 2018-03-10 ENCOUNTER — Emergency Department (HOSPITAL_COMMUNITY)
Admission: EM | Admit: 2018-03-10 | Discharge: 2018-03-10 | Disposition: A | Discharge status: Home / Self Care | Payer: PPO | Attending: Emergency Medicine | Admitting: Emergency Medicine

## 2018-03-10 ENCOUNTER — Encounter (HOSPITAL_COMMUNITY): Payer: Self-pay | Admitting: Emergency Medicine

## 2018-03-10 ENCOUNTER — Emergency Department (HOSPITAL_COMMUNITY): Payer: PPO

## 2018-03-10 ENCOUNTER — Other Ambulatory Visit: Payer: Self-pay

## 2018-03-10 DIAGNOSIS — S51012A Laceration without foreign body of left elbow, initial encounter: Secondary | ICD-10-CM | POA: Diagnosis not present

## 2018-03-10 DIAGNOSIS — Y939 Activity, unspecified: Secondary | ICD-10-CM | POA: Insufficient documentation

## 2018-03-10 DIAGNOSIS — S299XXA Unspecified injury of thorax, initial encounter: Secondary | ICD-10-CM | POA: Diagnosis not present

## 2018-03-10 DIAGNOSIS — S199XXA Unspecified injury of neck, initial encounter: Secondary | ICD-10-CM | POA: Diagnosis not present

## 2018-03-10 DIAGNOSIS — J449 Chronic obstructive pulmonary disease, unspecified: Secondary | ICD-10-CM | POA: Diagnosis not present

## 2018-03-10 DIAGNOSIS — W19XXXA Unspecified fall, initial encounter: Secondary | ICD-10-CM | POA: Insufficient documentation

## 2018-03-10 DIAGNOSIS — Y999 Unspecified external cause status: Secondary | ICD-10-CM | POA: Insufficient documentation

## 2018-03-10 DIAGNOSIS — Z96611 Presence of right artificial shoulder joint: Secondary | ICD-10-CM | POA: Diagnosis not present

## 2018-03-10 DIAGNOSIS — S0990XA Unspecified injury of head, initial encounter: Secondary | ICD-10-CM | POA: Diagnosis not present

## 2018-03-10 DIAGNOSIS — N390 Urinary tract infection, site not specified: Secondary | ICD-10-CM | POA: Diagnosis not present

## 2018-03-10 DIAGNOSIS — S5012XA Contusion of left forearm, initial encounter: Secondary | ICD-10-CM | POA: Diagnosis not present

## 2018-03-10 DIAGNOSIS — Z79899 Other long term (current) drug therapy: Secondary | ICD-10-CM | POA: Diagnosis not present

## 2018-03-10 DIAGNOSIS — Y92009 Unspecified place in unspecified non-institutional (private) residence as the place of occurrence of the external cause: Secondary | ICD-10-CM

## 2018-03-10 DIAGNOSIS — S6992XA Unspecified injury of left wrist, hand and finger(s), initial encounter: Secondary | ICD-10-CM | POA: Diagnosis not present

## 2018-03-10 DIAGNOSIS — I10 Essential (primary) hypertension: Secondary | ICD-10-CM | POA: Diagnosis not present

## 2018-03-10 DIAGNOSIS — Y929 Unspecified place or not applicable: Secondary | ICD-10-CM | POA: Diagnosis not present

## 2018-03-10 DIAGNOSIS — S51812A Laceration without foreign body of left forearm, initial encounter: Secondary | ICD-10-CM | POA: Diagnosis not present

## 2018-03-10 DIAGNOSIS — Z23 Encounter for immunization: Secondary | ICD-10-CM | POA: Diagnosis not present

## 2018-03-10 DIAGNOSIS — R58 Hemorrhage, not elsewhere classified: Secondary | ICD-10-CM | POA: Diagnosis not present

## 2018-03-10 DIAGNOSIS — R296 Repeated falls: Secondary | ICD-10-CM | POA: Diagnosis present

## 2018-03-10 LAB — COMPREHENSIVE METABOLIC PANEL
ALBUMIN: 3.5 g/dL (ref 3.5–5.0)
ALT: 17 U/L (ref 0–44)
ANION GAP: 6 (ref 5–15)
AST: 24 U/L (ref 15–41)
Alkaline Phosphatase: 90 U/L (ref 38–126)
BILIRUBIN TOTAL: 0.4 mg/dL (ref 0.3–1.2)
BUN: 9 mg/dL (ref 8–23)
CO2: 30 mmol/L (ref 22–32)
Calcium: 9.3 mg/dL (ref 8.9–10.3)
Chloride: 99 mmol/L (ref 98–111)
Creatinine, Ser: 0.84 mg/dL (ref 0.44–1.00)
GFR calc non Af Amer: >60 mL/min (ref >60)
GLUCOSE: 105 mg/dL — AB (ref 70–99)
POTASSIUM: 3.7 mmol/L (ref 3.5–5.1)
SODIUM: 135 mmol/L (ref 135–145)
Total Protein: 6.8 g/dL (ref 6.5–8.1)

## 2018-03-10 LAB — CBC WITH DIFFERENTIAL/PLATELET
Abs Immature Granulocytes: 0.02 10*3/uL (ref 0.00–0.07)
BASOS PCT: 1 %
Basophils Absolute: 0.1 10*3/uL (ref 0.0–0.1)
EOS ABS: 0.2 10*3/uL (ref 0.0–0.5)
EOS PCT: 2 %
HEMATOCRIT: 38.7 % (ref 36.0–46.0)
Hemoglobin: 12.2 g/dL (ref 12.0–15.0)
Immature Granulocytes: 0 %
Lymphocytes Relative: 14 %
Lymphs Abs: 1.4 10*3/uL (ref 0.7–4.0)
MCH: 30.2 pg (ref 26.0–34.0)
MCHC: 31.5 g/dL (ref 30.0–36.0)
MCV: 95.8 fL (ref 80.0–100.0)
MONO ABS: 1.3 10*3/uL — AB (ref 0.1–1.0)
Monocytes Relative: 12 %
NEUTROS PCT: 71 %
Neutro Abs: 7.4 10*3/uL (ref 1.7–7.7)
PLATELETS: 266 10*3/uL (ref 150–400)
RBC: 4.04 MIL/uL (ref 3.87–5.11)
RDW: 13.1 % (ref 11.5–15.5)
WBC: 10.4 10*3/uL (ref 4.0–10.5)
nRBC: 0 % (ref 0.0–0.2)

## 2018-03-10 LAB — RAPID URINE DRUG SCREEN, HOSP PERFORMED
AMPHETAMINES: NOT DETECTED
Barbiturates: NOT DETECTED
Benzodiazepines: POSITIVE — AB
Cocaine: NOT DETECTED
OPIATES: POSITIVE — AB
TETRAHYDROCANNABINOL: NOT DETECTED

## 2018-03-10 LAB — URINALYSIS, ROUTINE W REFLEX MICROSCOPIC
Bilirubin Urine: NEGATIVE
GLUCOSE, UA: NEGATIVE mg/dL
Hgb urine dipstick: NEGATIVE
Ketones, ur: NEGATIVE mg/dL
NITRITE: NEGATIVE
Protein, ur: NEGATIVE mg/dL
SPECIFIC GRAVITY, URINE: 1.024 (ref 1.005–1.030)
WBC, UA: >50 WBC/hpf — ABNORMAL HIGH (ref 0–5)
pH: 5 (ref 5.0–8.0)

## 2018-03-10 LAB — SALICYLATE LEVEL

## 2018-03-10 LAB — PROTIME-INR
INR: 1.07
Prothrombin Time: 13.8 seconds (ref 11.4–15.2)

## 2018-03-10 LAB — ETHANOL

## 2018-03-10 LAB — ACETAMINOPHEN LEVEL

## 2018-03-10 LAB — TROPONIN I

## 2018-03-10 MED ORDER — LIDOCAINE HCL (PF) 1 % IJ SOLN
INTRAMUSCULAR | Status: AC
  Filled 2018-03-10: qty 2

## 2018-03-10 MED ORDER — TETANUS-DIPHTH-ACELL PERTUSSIS 5-2.5-18.5 LF-MCG/0.5 IM SUSP
0.5000 mL | Freq: Once | INTRAMUSCULAR | Status: AC
  Administered 2018-03-10: 0.5 mL via INTRAMUSCULAR
  Filled 2018-03-10: qty 0.5

## 2018-03-10 MED ORDER — CEPHALEXIN 500 MG PO CAPS: 500.0000 mg | ORAL_CAPSULE | Freq: Four times a day (QID) | ORAL | 0 refills | Status: DC

## 2018-03-10 MED ORDER — NALOXONE HCL 0.4 MG/ML IJ SOLN
0.4000 mg | Freq: Once | INTRAMUSCULAR | Status: AC
  Administered 2018-03-10: 0.4 mg via INTRAVENOUS
  Filled 2018-03-10: qty 1

## 2018-03-10 MED ORDER — SODIUM CHLORIDE 0.9 % IV SOLN
1.0000 g | Freq: Once | INTRAVENOUS | Status: AC
  Administered 2018-03-10: 1 g via INTRAVENOUS
  Filled 2018-03-10: qty 10

## 2018-03-10 MED ORDER — LIDOCAINE-EPINEPHRINE (PF) 2 %-1:200000 IJ SOLN
10.0000 mL | Freq: Once | INTRAMUSCULAR | Status: AC
  Administered 2018-03-10: 10 mL
  Filled 2018-03-10: qty 10

## 2018-03-10 NOTE — Discharge Instructions (Addendum)
Take the prescription as directed.  Wash the lacerated area gently with soap and water, and pat dry, at least twice a day, and cover with a clean/dry dressing.  Change the dressing whenever it becomes wet or soiled after washing the area with soap and water and patting dry. Move slowly when changing positions. Increase your fluid intake for the next several days. Review your medication list with your family doctor at your next office visit. Your staples will need to be removed in the next 7 to 10 days. Call your regular medical doctor on Monday to schedule a follow up appointment for a recheck within the next 2 to 3 days.  Return to the Emergency Department immediately if worsening.

## 2018-03-10 NOTE — ED Notes (Signed)
Pt given HH meal tray 

## 2018-03-10 NOTE — ED Notes (Signed)
Pt ambulated with steady gait to restroom ?

## 2018-03-10 NOTE — ED Triage Notes (Signed)
Pt here for evaluation after a fall. States she has been falling a lot recently due to lack of sleep, loss of appetite and not drinking fluids, and neuropathy. Pt has multiple bruises on both arms in different levels of healing. Pt also has a laceration to LT forearm. EMS states they irrigated and wrapped it. Bleeding controlled at this time.

## 2018-03-10 NOTE — ED Provider Notes (Signed)
LACERATION REPAIR Performed by: Alyse Low Authorized by: Alyse Low Consent: Verbal consent obtained. Risks and benefits: risks, benefits and alternatives were discussed Consent given by: patient Patient identity confirmed: provided demographic data Prepped and Draped in normal sterile fashion Wound explored  Laceration Location: left forearm  Laceration Length: 10cm  No Foreign Bodies seen or palpated  Anesthesia: local infiltration  Local anesthetic: lidocaine%  Anesthetic total: 4 ml  Irrigation method: syringe Amount of cleaning: standard  Skin closure: staples  Number of sutures: 10 staples  Technique: staples  Patient tolerance: Patient tolerated the procedure well with no immediate complications.   Fransico Meadow, PA-C 03/10/18 [place], [place], DO 03/14/18 [phone]
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
## 2018-03-13 LAB — URINE CULTURE

## 2018-03-14 ENCOUNTER — Telehealth: Payer: Self-pay

## 2018-03-14 NOTE — Telephone Encounter (Signed)
Post ED Visit - Positive Culture Follow-up  Culture report reviewed by antimicrobial stewardship pharmacist:  []  Elenor Quinones, Pharm.D. []  Heide Guile, Pharm.D., BCPS AQ-ID []  Parks Neptune, Pharm.D., BCPS []  Alycia Rossetti, Pharm.D., BCPS []  [place], Pharm.D., BCPS, AAHIVP []  Legrand Como, Pharm.D., BCPS, AAHIVP []  Salome Arnt, PharmD, BCPS []  Johnnette Gourd, PharmD, BCPS []  Hughes Better, PharmD, BCPS []  Leeroy Cha, PharmD  H Barid Pharm D Positive urine culture Treated with Cephalexin, organism sensitive to the same and no further patient follow-up is required at this time.  Genia Del 03/14/2018, 1:04 PM

## 2018-03-20 DIAGNOSIS — Z6825 Body mass index (BMI) 25.0-25.9, adult: Secondary | ICD-10-CM | POA: Diagnosis not present

## 2018-03-20 DIAGNOSIS — R42 Dizziness and giddiness: Secondary | ICD-10-CM | POA: Diagnosis not present

## 2018-03-20 DIAGNOSIS — G629 Polyneuropathy, unspecified: Secondary | ICD-10-CM | POA: Diagnosis not present

## 2018-03-20 DIAGNOSIS — F17210 Nicotine dependence, cigarettes, uncomplicated: Secondary | ICD-10-CM | POA: Diagnosis not present

## 2018-03-20 DIAGNOSIS — Z299 Encounter for prophylactic measures, unspecified: Secondary | ICD-10-CM | POA: Diagnosis not present

## 2018-03-20 DIAGNOSIS — I10 Essential (primary) hypertension: Secondary | ICD-10-CM | POA: Diagnosis not present

## 2018-04-03 DIAGNOSIS — M797 Fibromyalgia: Secondary | ICD-10-CM | POA: Diagnosis not present

## 2018-04-03 DIAGNOSIS — R296 Repeated falls: Secondary | ICD-10-CM | POA: Diagnosis not present

## 2018-04-03 DIAGNOSIS — M25519 Pain in unspecified shoulder: Secondary | ICD-10-CM | POA: Diagnosis not present

## 2018-04-03 DIAGNOSIS — M545 Low back pain: Secondary | ICD-10-CM | POA: Diagnosis not present

## 2018-04-03 DIAGNOSIS — G4733 Obstructive sleep apnea (adult) (pediatric): Secondary | ICD-10-CM | POA: Diagnosis not present

## 2018-04-03 DIAGNOSIS — R569 Unspecified convulsions: Secondary | ICD-10-CM | POA: Diagnosis not present

## 2018-04-11 ENCOUNTER — Other Ambulatory Visit: Payer: Self-pay

## 2018-04-11 ENCOUNTER — Emergency Department (HOSPITAL_COMMUNITY): Payer: PPO

## 2018-04-11 ENCOUNTER — Encounter (HOSPITAL_COMMUNITY): Payer: Self-pay | Admitting: Emergency Medicine

## 2018-04-11 ENCOUNTER — Inpatient Hospital Stay (HOSPITAL_COMMUNITY)
Admission: EM | Admit: 2018-04-11 | Discharge: 2018-04-15 | DRG: 194 | Disposition: A | Discharge status: Home Health | Payer: PPO | Attending: Family Medicine | Admitting: Family Medicine

## 2018-04-11 DIAGNOSIS — M6281 Muscle weakness (generalized): Secondary | ICD-10-CM | POA: Diagnosis not present

## 2018-04-11 DIAGNOSIS — Z6823 Body mass index (BMI) 23.0-23.9, adult: Secondary | ICD-10-CM | POA: Diagnosis not present

## 2018-04-11 DIAGNOSIS — Z9081 Acquired absence of spleen: Secondary | ICD-10-CM | POA: Diagnosis not present

## 2018-04-11 DIAGNOSIS — K219 Gastro-esophageal reflux disease without esophagitis: Secondary | ICD-10-CM | POA: Diagnosis not present

## 2018-04-11 DIAGNOSIS — E860 Dehydration: Secondary | ICD-10-CM | POA: Diagnosis present

## 2018-04-11 DIAGNOSIS — E871 Hypo-osmolality and hyponatremia: Secondary | ICD-10-CM | POA: Diagnosis present

## 2018-04-11 DIAGNOSIS — J9811 Atelectasis: Secondary | ICD-10-CM | POA: Diagnosis not present

## 2018-04-11 DIAGNOSIS — Z825 Family history of asthma and other chronic lower respiratory diseases: Secondary | ICD-10-CM | POA: Diagnosis not present

## 2018-04-11 DIAGNOSIS — E876 Hypokalemia: Secondary | ICD-10-CM | POA: Diagnosis not present

## 2018-04-11 DIAGNOSIS — J101 Influenza due to other identified influenza virus with other respiratory manifestations: Principal | ICD-10-CM | POA: Diagnosis present

## 2018-04-11 DIAGNOSIS — F17210 Nicotine dependence, cigarettes, uncomplicated: Secondary | ICD-10-CM | POA: Diagnosis not present

## 2018-04-11 DIAGNOSIS — J441 Chronic obstructive pulmonary disease with (acute) exacerbation: Secondary | ICD-10-CM | POA: Diagnosis present

## 2018-04-11 DIAGNOSIS — D539 Nutritional anemia, unspecified: Secondary | ICD-10-CM | POA: Diagnosis present

## 2018-04-11 DIAGNOSIS — R52 Pain, unspecified: Secondary | ICD-10-CM | POA: Diagnosis not present

## 2018-04-11 DIAGNOSIS — N390 Urinary tract infection, site not specified: Secondary | ICD-10-CM | POA: Diagnosis present

## 2018-04-11 DIAGNOSIS — E440 Moderate protein-calorie malnutrition: Secondary | ICD-10-CM | POA: Diagnosis not present

## 2018-04-11 DIAGNOSIS — Z79891 Long term (current) use of opiate analgesic: Secondary | ICD-10-CM

## 2018-04-11 DIAGNOSIS — I10 Essential (primary) hypertension: Secondary | ICD-10-CM | POA: Diagnosis present

## 2018-04-11 DIAGNOSIS — N179 Acute kidney failure, unspecified: Secondary | ICD-10-CM | POA: Diagnosis present

## 2018-04-11 DIAGNOSIS — I959 Hypotension, unspecified: Secondary | ICD-10-CM | POA: Diagnosis not present

## 2018-04-11 DIAGNOSIS — M1990 Unspecified osteoarthritis, unspecified site: Secondary | ICD-10-CM | POA: Diagnosis not present

## 2018-04-11 DIAGNOSIS — F329 Major depressive disorder, single episode, unspecified: Secondary | ICD-10-CM | POA: Diagnosis present

## 2018-04-11 DIAGNOSIS — R5381 Other malaise: Secondary | ICD-10-CM | POA: Diagnosis not present

## 2018-04-11 DIAGNOSIS — Z811 Family history of alcohol abuse and dependence: Secondary | ICD-10-CM

## 2018-04-11 DIAGNOSIS — Z79899 Other long term (current) drug therapy: Secondary | ICD-10-CM

## 2018-04-11 DIAGNOSIS — G8929 Other chronic pain: Secondary | ICD-10-CM | POA: Diagnosis not present

## 2018-04-11 DIAGNOSIS — R531 Weakness: Secondary | ICD-10-CM | POA: Diagnosis not present

## 2018-04-11 DIAGNOSIS — Z8249 Family history of ischemic heart disease and other diseases of the circulatory system: Secondary | ICD-10-CM

## 2018-04-11 DIAGNOSIS — R5383 Other fatigue: Secondary | ICD-10-CM

## 2018-04-11 DIAGNOSIS — R296 Repeated falls: Secondary | ICD-10-CM | POA: Diagnosis present

## 2018-04-11 DIAGNOSIS — G629 Polyneuropathy, unspecified: Secondary | ICD-10-CM | POA: Diagnosis not present

## 2018-04-11 DIAGNOSIS — Z96611 Presence of right artificial shoulder joint: Secondary | ICD-10-CM | POA: Diagnosis not present

## 2018-04-11 DIAGNOSIS — G894 Chronic pain syndrome: Secondary | ICD-10-CM | POA: Diagnosis present

## 2018-04-11 DIAGNOSIS — L8990 Pressure ulcer of unspecified site, unspecified stage: Secondary | ICD-10-CM

## 2018-04-11 DIAGNOSIS — L89322 Pressure ulcer of left buttock, stage 2: Secondary | ICD-10-CM | POA: Diagnosis not present

## 2018-04-11 DIAGNOSIS — L89312 Pressure ulcer of right buttock, stage 2: Secondary | ICD-10-CM | POA: Diagnosis not present

## 2018-04-11 DIAGNOSIS — R0902 Hypoxemia: Secondary | ICD-10-CM | POA: Diagnosis not present

## 2018-04-11 DIAGNOSIS — F32A Depression, unspecified: Secondary | ICD-10-CM | POA: Diagnosis present

## 2018-04-11 DIAGNOSIS — D649 Anemia, unspecified: Secondary | ICD-10-CM | POA: Diagnosis present

## 2018-04-11 DIAGNOSIS — R4182 Altered mental status, unspecified: Secondary | ICD-10-CM

## 2018-04-11 DIAGNOSIS — F4310 Post-traumatic stress disorder, unspecified: Secondary | ICD-10-CM | POA: Diagnosis not present

## 2018-04-11 LAB — COMPREHENSIVE METABOLIC PANEL
ALBUMIN: 3.3 g/dL — AB (ref 3.5–5.0)
ALT: 29 U/L (ref 0–44)
AST: 59 U/L — ABNORMAL HIGH (ref 15–41)
Alkaline Phosphatase: 78 U/L (ref 38–126)
Anion gap: 12 (ref 5–15)
BILIRUBIN TOTAL: 0.7 mg/dL (ref 0.3–1.2)
BUN: 46 mg/dL — AB (ref 8–23)
CO2: 26 mmol/L (ref 22–32)
Calcium: 7.8 mg/dL — ABNORMAL LOW (ref 8.9–10.3)
Chloride: 89 mmol/L — ABNORMAL LOW (ref 98–111)
Creatinine, Ser: 2.66 mg/dL — ABNORMAL HIGH (ref 0.44–1.00)
GFR calc Af Amer: 22 mL/min — ABNORMAL LOW (ref >60)
GFR calc non Af Amer: 19 mL/min — ABNORMAL LOW (ref >60)
Glucose, Bld: 126 mg/dL — ABNORMAL HIGH (ref 70–99)
Potassium: 3.7 mmol/L (ref 3.5–5.1)
Sodium: 127 mmol/L — ABNORMAL LOW (ref 135–145)
Total Protein: 6.4 g/dL — ABNORMAL LOW (ref 6.5–8.1)

## 2018-04-11 LAB — CBC
HCT: 30.9 % — ABNORMAL LOW (ref 36.0–46.0)
Hemoglobin: 9.9 g/dL — ABNORMAL LOW (ref 12.0–15.0)
MCH: 28.9 pg (ref 26.0–34.0)
MCHC: 32 g/dL (ref 30.0–36.0)
MCV: 90.1 fL (ref 80.0–100.0)
Platelets: 178 10*3/uL (ref 150–400)
RBC: 3.43 MIL/uL — ABNORMAL LOW (ref 3.87–5.11)
RDW: 12.9 % (ref 11.5–15.5)
WBC: 9.7 10*3/uL (ref 4.0–10.5)
nRBC: 0 % (ref 0.0–0.2)

## 2018-04-11 LAB — TSH: TSH: 0.887 u[IU]/mL (ref 0.350–4.500)

## 2018-04-11 LAB — INFLUENZA PANEL BY PCR (TYPE A & B)
Influenza A By PCR: POSITIVE — AB
Influenza B By PCR: NEGATIVE

## 2018-04-11 LAB — RAPID URINE DRUG SCREEN, HOSP PERFORMED
Amphetamines: NOT DETECTED
Barbiturates: NOT DETECTED
Benzodiazepines: POSITIVE — AB
Cocaine: NOT DETECTED
Opiates: POSITIVE — AB
Tetrahydrocannabinol: NOT DETECTED

## 2018-04-11 LAB — ETHANOL

## 2018-04-11 MED ORDER — SODIUM CHLORIDE 0.9 % IV BOLUS
1000.0000 mL | Freq: Once | INTRAVENOUS | Status: AC
  Administered 2018-04-11: 1000 mL via INTRAVENOUS

## 2018-04-11 MED ORDER — OSELTAMIVIR PHOSPHATE 75 MG PO CAPS
75.0000 mg | ORAL_CAPSULE | Freq: Once | ORAL | Status: AC
  Administered 2018-04-11: 75 mg via ORAL
  Filled 2018-04-11: qty 1

## 2018-04-11 MED ORDER — LACTATED RINGERS IV SOLN
INTRAVENOUS | Status: DC
  Administered 2018-04-11 – 2018-04-15 (×9): via INTRAVENOUS

## 2018-04-11 NOTE — ED Triage Notes (Signed)
Pt comes from home via [facility]. Pt reports increased weakness for "a few months now." Pt states 1 month ago she was told by her PCP to "cut her medications back so I am not so weak." Pt reports pain to her legs and back. Pt alert and oriented x 4.

## 2018-04-11 NOTE — ED Provider Notes (Signed)
Blood pressure 122/61, pulse (!) 36, temperature 98.1 F (36.7 C), temperature source Oral, resp. rate 15, SpO2 (!) 82 %.  Assuming care from Dr. Sedonia Small.  In short, Brooke Deleon is a 63 y.o. female with a chief complaint of Weakness .  Refer to the original H&P for additional details.  The current plan of care is to follow up on labs and likely admit for AMS.  Patient is flu positive and hyponatremic with an acute kidney injury.  Began lactated Ringer's to follow the patient's IV fluid bolus.  Started Tamiflu as well.  Discussed patient's case with Hospitalist, Dr. Darrick Meigs to request admission. Patient and family (if present) updated with plan. Care transferred to Hospitalist service.  I reviewed all nursing notes, vitals, pertinent old records, EKGs, labs, imaging (as available).   EKG Interpretation  Date/Time:  Wednesday April 11 2018 20:41:34 EST Ventricular Rate:  87 PR Interval:    QRS Duration: 99 QT Interval:  404 QTC Calculation: 486 R Axis:   96 Text Interpretation:  Sinus rhythm Right axis deviation Borderline repolarization abnormality Borderline prolonged QT interval Confirmed by Gerlene Fee [phone]) on 04/11/2018 8:59:26 PM         Yaira Bernardi, [facility], MD 04/11/18 2314
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
## 2018-04-12 DIAGNOSIS — L8990 Pressure ulcer of unspecified site, unspecified stage: Secondary | ICD-10-CM

## 2018-04-12 LAB — URINALYSIS, ROUTINE W REFLEX MICROSCOPIC
Bilirubin Urine: NEGATIVE
Glucose, UA: NEGATIVE mg/dL
Ketones, ur: NEGATIVE mg/dL
Nitrite: NEGATIVE
PROTEIN: 30 mg/dL — AB
Specific Gravity, Urine: 1.013 (ref 1.005–1.030)
pH: 5 (ref 5.0–8.0)

## 2018-04-12 LAB — COMPREHENSIVE METABOLIC PANEL
ALK PHOS: 64 U/L (ref 38–126)
ALT: 26 U/L (ref 0–44)
AST: 51 U/L — ABNORMAL HIGH (ref 15–41)
Albumin: 2.8 g/dL — ABNORMAL LOW (ref 3.5–5.0)
Anion gap: 10 (ref 5–15)
BUN: 38 mg/dL — ABNORMAL HIGH (ref 8–23)
CO2: 26 mmol/L (ref 22–32)
Calcium: 7.6 mg/dL — ABNORMAL LOW (ref 8.9–10.3)
Chloride: 94 mmol/L — ABNORMAL LOW (ref 98–111)
Creatinine, Ser: 2.05 mg/dL — ABNORMAL HIGH (ref 0.44–1.00)
GFR calc Af Amer: 30 mL/min — ABNORMAL LOW (ref >60)
GFR calc non Af Amer: 26 mL/min — ABNORMAL LOW (ref >60)
Glucose, Bld: 108 mg/dL — ABNORMAL HIGH (ref 70–99)
Potassium: 3.7 mmol/L (ref 3.5–5.1)
SODIUM: 130 mmol/L — AB (ref 135–145)
Total Bilirubin: 0.6 mg/dL (ref 0.3–1.2)
Total Protein: 5.7 g/dL — ABNORMAL LOW (ref 6.5–8.1)

## 2018-04-12 LAB — CBC
HCT: 26.7 % — ABNORMAL LOW (ref 36.0–46.0)
Hemoglobin: 8.8 g/dL — ABNORMAL LOW (ref 12.0–15.0)
MCH: 29.8 pg (ref 26.0–34.0)
MCHC: 33 g/dL (ref 30.0–36.0)
MCV: 90.5 fL (ref 80.0–100.0)
Platelets: 152 10*3/uL (ref 150–400)
RBC: 2.95 MIL/uL — ABNORMAL LOW (ref 3.87–5.11)
RDW: 13 % (ref 11.5–15.5)
WBC: 8.2 10*3/uL (ref 4.0–10.5)
nRBC: 0 % (ref 0.0–0.2)

## 2018-04-12 LAB — OSMOLALITY: Osmolality: 277 mOsm/kg (ref 275–295)

## 2018-04-12 MED ORDER — DULOXETINE HCL 30 MG PO CPEP
30.0000 mg | ORAL_CAPSULE | ORAL | Status: DC
  Administered 2018-04-13 – 2018-04-15 (×3): 30 mg via ORAL
  Filled 2018-04-12 (×5): qty 1

## 2018-04-12 MED ORDER — GABAPENTIN 400 MG PO CAPS
400.0000 mg | ORAL_CAPSULE | Freq: Four times a day (QID) | ORAL | Status: DC
  Administered 2018-04-12 – 2018-04-15 (×14): 400 mg via ORAL
  Filled 2018-04-12 (×14): qty 1

## 2018-04-12 MED ORDER — OXYCODONE-ACETAMINOPHEN 5-325 MG PO TABS
1.0000 | ORAL_TABLET | Freq: Three times a day (TID) | ORAL | Status: DC | PRN
  Administered 2018-04-12 – 2018-04-15 (×7): 1 via ORAL
  Filled 2018-04-12 (×7): qty 1

## 2018-04-12 MED ORDER — ONDANSETRON HCL 4 MG PO TABS: 4.0000 mg | ORAL_TABLET | Freq: Four times a day (QID) | ORAL | Status: DC | PRN

## 2018-04-12 MED ORDER — METOPROLOL SUCCINATE ER 50 MG PO TB24
50.0000 mg | ORAL_TABLET | Freq: Every day | ORAL | Status: DC
  Administered 2018-04-13 – 2018-04-15 (×3): 50 mg via ORAL
  Filled 2018-04-12 (×3): qty 1

## 2018-04-12 MED ORDER — ALBUTEROL SULFATE (2.5 MG/3ML) 0.083% IN NEBU: 2.5000 mg | INHALATION_SOLUTION | Freq: Four times a day (QID) | RESPIRATORY_TRACT | Status: DC

## 2018-04-12 MED ORDER — GABAPENTIN 400 MG PO CAPS
800.0000 mg | ORAL_CAPSULE | Freq: Four times a day (QID) | ORAL | Status: DC
  Filled 2018-04-12: qty 2

## 2018-04-12 MED ORDER — ENOXAPARIN SODIUM 30 MG/0.3ML ~~LOC~~ SOLN
30.0000 mg | SUBCUTANEOUS | Status: DC
  Administered 2018-04-12: 30 mg via SUBCUTANEOUS
  Filled 2018-04-12: qty 0.3

## 2018-04-12 MED ORDER — PREDNISONE 20 MG PO TABS
50.0000 mg | ORAL_TABLET | Freq: Every day | ORAL | Status: DC
  Administered 2018-04-13 – 2018-04-15 (×3): 50 mg via ORAL
  Filled 2018-04-12 (×3): qty 1

## 2018-04-12 MED ORDER — CITALOPRAM HYDROBROMIDE 20 MG PO TABS
40.0000 mg | ORAL_TABLET | Freq: Every day | ORAL | Status: DC
  Administered 2018-04-12 – 2018-04-15 (×4): 40 mg via ORAL
  Filled 2018-04-12 (×4): qty 2

## 2018-04-12 MED ORDER — ACETAMINOPHEN 325 MG PO TABS
650.0000 mg | ORAL_TABLET | Freq: Four times a day (QID) | ORAL | Status: DC | PRN
  Administered 2018-04-13: 650 mg via ORAL
  Filled 2018-04-12: qty 2

## 2018-04-12 MED ORDER — METOPROLOL SUCCINATE ER 50 MG PO TB24
50.0000 mg | ORAL_TABLET | Freq: Two times a day (BID) | ORAL | Status: DC
  Filled 2018-04-12: qty 1

## 2018-04-12 MED ORDER — OSELTAMIVIR PHOSPHATE 30 MG PO CAPS
30.0000 mg | ORAL_CAPSULE | Freq: Every day | ORAL | Status: DC
  Administered 2018-04-12 – 2018-04-14 (×3): 30 mg via ORAL
  Filled 2018-04-12 (×3): qty 1

## 2018-04-12 MED ORDER — OXYCODONE-ACETAMINOPHEN 5-325 MG PO TABS: 1.0000 | ORAL_TABLET | Freq: Three times a day (TID) | ORAL | Status: DC | PRN

## 2018-04-12 MED ORDER — DESMOPRESSIN ACETATE 0.2 MG PO TABS: 0.2000 mg | ORAL_TABLET | Freq: Every day | ORAL | Status: DC

## 2018-04-12 MED ORDER — PANTOPRAZOLE SODIUM 40 MG PO TBEC
40.0000 mg | DELAYED_RELEASE_TABLET | Freq: Two times a day (BID) | ORAL | Status: DC
  Administered 2018-04-12 – 2018-04-15 (×8): 40 mg via ORAL
  Filled 2018-04-12 (×8): qty 1

## 2018-04-12 MED ORDER — IPRATROPIUM-ALBUTEROL 0.5-2.5 (3) MG/3ML IN SOLN
3.0000 mL | Freq: Four times a day (QID) | RESPIRATORY_TRACT | Status: DC
  Administered 2018-04-12 – 2018-04-15 (×15): 3 mL via RESPIRATORY_TRACT
  Filled 2018-04-12 (×16): qty 3

## 2018-04-12 MED ORDER — IPRATROPIUM BROMIDE 0.02 % IN SOLN: 0.5000 mg | Freq: Four times a day (QID) | RESPIRATORY_TRACT | Status: DC

## 2018-04-12 MED ORDER — FERROUS SULFATE 325 (65 FE) MG PO TABS
325.0000 mg | ORAL_TABLET | Freq: Every day | ORAL | Status: DC
  Administered 2018-04-12 – 2018-04-15 (×4): 325 mg via ORAL
  Filled 2018-04-12 (×4): qty 1

## 2018-04-12 MED ORDER — METHYLPREDNISOLONE SODIUM SUCC 40 MG IJ SOLR
40.0000 mg | Freq: Two times a day (BID) | INTRAMUSCULAR | Status: AC
  Administered 2018-04-12 (×2): 40 mg via INTRAVENOUS
  Filled 2018-04-12 (×2): qty 1

## 2018-04-12 MED ORDER — ONDANSETRON HCL 4 MG/2ML IJ SOLN: 4.0000 mg | Freq: Four times a day (QID) | INTRAMUSCULAR | Status: DC | PRN

## 2018-04-12 MED ORDER — OXYCODONE-ACETAMINOPHEN 10-325 MG PO TABS: 1.0000 | ORAL_TABLET | Freq: Three times a day (TID) | ORAL | Status: DC | PRN

## 2018-04-12 MED ORDER — OXYBUTYNIN CHLORIDE ER 5 MG PO TB24
15.0000 mg | ORAL_TABLET | Freq: Every day | ORAL | Status: DC
  Administered 2018-04-12 – 2018-04-14 (×3): 15 mg via ORAL
  Filled 2018-04-12 (×3): qty 3

## 2018-04-12 NOTE — Progress Notes (Signed)
[facility] Progress Note  Patient: Brooke Deleon JYN:[id number]   PCP: Glenda Chroman, MD DOB: 23-Jul-1955   DOA: 04/11/2018   DOS: 04/12/2018   Date of Service: the patient was seen and examined on 04/12/2018  Brief hospital course: Pt. with PMH of depression, COPD; admitted on 04/11/2018, presented with complaint of cough and shortness of breath, was found to have acute kidney injury and influenza infection. Currently further plan is continue IV hydration.  Subjective: Patient was sleepy and drowsy.  After awakening her denies any complaints of chest pain or abdominal pain.  Reports some nausea.  No diarrhea no constipation.  Feels thirsty.  Continues to have cough and shortness of breath.  Assessment and Plan: 1. Influenza A influenza panel is positive for influenza A.   Started on Tamiflu.   Will continue Tamiflu 30 mg p.o. twice daily.   Continue droplet precautions. Add steroids.  2. Acute kidney injury patient's creatinine is 2.66, baseline creatinine 0.84 as of February 2020. Started on Ringer lactate. Continue IV fluids. Adjust medication based on renal function.  3. Hyponatremia- secondary to dehydration, will check serum osmolality.  Started on IV fluids as above.  Follow BMP in a.m.  4. Depression-continue Celexa, Cymbalta.  5.  Neuropathy and anxiety. Unclear etiology. We will continue with gabapentin but reduce the dose from 3200/day to 1600/day to renal function.  6. Hypertension continue metoprolol.  7.  Anemia undetermined etiology Baseline hemoglobin up until last March was 12-13. In February hemoglobin was 12.2. Patient presents with hemoglobin of 9.9. Currently dropped to 8.8 likely secondary to hydration and dilution. Etiology of this drop from 12-9 is not clear. No bleeding here in the hospital but Monitor H&H and may require outpatient work-up for anemia.  8.  Pressure ulcers, bilateral buttock area.  Present on admission.  Frequent  changing.  Pressure Injury 04/12/18 Stage II -  Partial thickness loss of dermis presenting as a shallow open ulcer with a red, pink wound bed without slough. (Active)  04/12/18 0149  Location: Buttocks  Location Orientation: Mid  Staging: Stage II -  Partial thickness loss of dermis presenting as a shallow open ulcer with a red, pink wound bed without slough.  Wound Description (Comments):   Present on Admission: Yes     Diet: Cardiac diet DVT Prophylaxis: subcutaneous Heparin  Advance goals of care discussion: full code  Family Communication: no family was present at bedside, at the time of interview.  Disposition:  Discharge to home.  Consultants: none Procedures: none  Scheduled Meds: . citalopram  40 mg Oral Daily  . DULoxetine  30 mg Oral BH-q7a  . ferrous sulfate  325 mg Oral Q breakfast  . gabapentin  400 mg Oral QID  . ipratropium-albuterol  3 mL Nebulization Q6H  . methylPREDNISolone (SOLU-MEDROL) injection  40 mg Intravenous BID  . [START ON 04/13/2018] metoprolol succinate  50 mg Oral Daily  . oseltamivir  30 mg Oral QHS  . oxybutynin  15 mg Oral QHS  . pantoprazole  40 mg Oral BID AC  . [START ON 04/13/2018] predniSONE  50 mg Oral Q breakfast   Continuous Infusions: . lactated ringers 100 mL/hr at 04/12/18 1417   PRN Meds: acetaminophen, ondansetron **OR** ondansetron (ZOFRAN) IV, oxyCODONE-acetaminophen Antibiotics: Anti-infectives (From admission, onward)   Start     Dose/Rate Route Frequency Ordered Stop   04/12/18 2200  oseltamivir (TAMIFLU) capsule 30 mg     30 mg Oral Daily at bedtime 04/12/18 0128 04/16/18  2159   04/11/18 2300  oseltamivir (TAMIFLU) capsule 75 mg     75 mg Oral  Once 04/11/18 2251 04/11/18 2317       Objective: Physical Exam: Vitals:   04/12/18 0251 04/12/18 0611 04/12/18 0730 04/12/18 1333  BP:  126/68  140/76  Pulse:  88  95  Resp:  17  18  Temp:  98.4 F (36.9 C)  98.3 F (36.8 C)  TempSrc:  Oral  Oral  SpO2: 93% 92% 97%  99%  Weight:      Height:        Intake/Output Summary (Last 24 hours) at 04/12/2018 1445 Last data filed at 04/12/2018 0900 Gross per 24 hour  Intake 1503.37 ml  Output 400 ml  Net 1103.37 ml   Filed Weights   04/11/18 2328 04/12/18 0136  Weight: 59.9 kg 55 kg   General: Alert, Awake and Oriented to Time, Place and Person. Appear in moderate distress, affect appropriatee Eyes: PERRL, Conjunctiva normal ENT: Oral Mucosa clear dry Neck: no JVD, no Abnormal Mass Or lumps Cardiovascular: S1 and S2 Present, no Murmur, Peripheral Pulses Present Respiratory: increased respiratory effort, Bilateral Air entry equal and Decreased, no use of accessory muscle, bilateral  Crackles, bilateral  wheezes Abdomen: Bowel Sound present, Soft and no tenderness, no hernia Skin: no redness, no Rash, no induration Extremities: no Pedal edema, no calf tenderness Neurologic: Grossly no focal neuro deficit. Bilaterally Equal motor strength  Data Reviewed: CBC: Recent Labs  Lab 04/11/18 2217 04/12/18 0229  WBC 9.7 8.2  HGB 9.9* 8.8*  HCT 30.9* 26.7*  MCV 90.1 90.5  PLT 178 734   Basic Metabolic Panel: Recent Labs  Lab 04/11/18 2217 04/12/18 0229  NA 127* 130*  K 3.7 3.7  CL 89* 94*  CO2 26 26  GLUCOSE 126* 108*  BUN 46* 38*  CREATININE 2.66* 2.05*  CALCIUM 7.8* 7.6*    Liver Function Tests: Recent Labs  Lab 04/11/18 2217 04/12/18 0229  AST 59* 51*  ALT 29 26  ALKPHOS 78 64  BILITOT 0.7 0.6  PROT 6.4* 5.7*  ALBUMIN 3.3* 2.8*   No results for input(s): LIPASE, AMYLASE in the last 168 hours. No results for input(s): AMMONIA in the last 168 hours. Coagulation Profile: No results for input(s): INR, PROTIME in the last 168 hours. Cardiac Enzymes: No results for input(s): CKTOTAL, CKMB, CKMBINDEX, TROPONINI in the last 168 hours. BNP (last 3 results) No results for input(s): PROBNP in the last 8760 hours. CBG: No results for input(s): GLUCAP in the last 168 hours. Studies: Dg  Chest 2 View  Result Date: 04/11/2018 CLINICAL DATA:  Malaise and fatigue EXAM: CHEST - 2 VIEW COMPARISON:  03/10/2020, 10/09/2016 FINDINGS: Streaky bibasilar atelectasis. No focal consolidation or effusion. Normal cardiomediastinal silhouette. No pneumothorax. Status post right shoulder replacement. IMPRESSION: Streaky atelectasis at the bases.  No focal pulmonary infiltrate. Electronically Signed   By: Donavan Foil M.D.   On: 04/11/2018 21:37   Ct Head Wo Contrast  Result Date: 04/11/2018 CLINICAL DATA:  Increased weakness EXAM: CT HEAD WITHOUT CONTRAST TECHNIQUE: Contiguous axial images were obtained from the base of the skull through the vertex without intravenous contrast. COMPARISON:  CT 03/10/2018 FINDINGS: Brain: No acute territorial infarction, hemorrhage or intracranial mass. Atrophy and mild small vessel ischemic changes of the white matter. Stable ventricle size Vascular: No hyperdense vessels.  Carotid vascular calcification Skull: Normal. Negative for fracture or focal lesion. Sinuses/Orbits: No acute finding. Other: None IMPRESSION: 1. No CT  evidence for acute intracranial abnormality. 2. Atrophy and mild small vessel ischemic changes of the white matter Electronically Signed   By: Donavan Foil M.D.   On: 04/11/2018 21:35   Time spent: 35 minutes  Author: Berle Mull, MD [facility] 04/12/2018 2:45 PM  To reach On-call, see care teams to locate the attending and reach out to them via [email or web address]. If 7PM-7AM, please contact night-coverage If you still have difficulty reaching the attending provider, please page the [facility] (Director on Call) for [facility] on amion for assistance.

## 2018-04-12 NOTE — Evaluation (Signed)
Physical Therapy Evaluation Patient Details Name: Brooke Deleon MRN: [id number] DOB: [birth year] Today's Date: 04/12/2018   History of Present Illness  Patient is a 63 year old female admitted 04/11/2018 with symptoms of generalized weakness and diagnosis of influenza A. PMH: pressure injury of skin Stage II sacrum this admission, depression, anxiety, COPD, HTN, fibromylagia, GERDhistory of UTI, reverse total shoulder arthroplasty.    Clinical Impression  Pt admitted with above diagnosis. Pt currently with functional limitations due to the deficits listed below (see PT Problem List). Patient presents lying in bed with nursing administering medicines. Patient agreeable to participating in evaluation. Patient requires assistance for bed mobility with c/o rib and back pain. Patient reports she has fallen several times recently. Patient required assistance for transfers and ambulation with RW exhibiting fatigue and generalized weakness. Patient with recent history of falling. Pt will benefit from skilled PT to increase their independence and safety with mobility to allow discharge to the venue listed below.       Follow Up Recommendations Home health PT;Supervision - Intermittent    Equipment Recommendations  Rolling walker with 5" wheels    Recommendations for Other Services       Precautions / Restrictions Precautions Precautions: Fall Restrictions Weight Bearing Restrictions: No      Mobility  Bed Mobility Overal bed mobility: Needs Assistance Bed Mobility: Supine to Sit     Supine to sit: Min guard;HOB elevated        Transfers Overall transfer level: Needs assistance Equipment used: Rolling walker (2 wheeled) Transfers: Sit to/from [facility] Sit to Stand: Min assist;Min guard Stand pivot transfers: Min guard       General transfer comment: increased time  Ambulation/Gait Ambulation/Gait assistance: Min guard Gait Distance (Feet): 4 Feet Assistive  device: Rolling walker (2 wheeled) Gait Pattern/deviations: Step-to pattern;Decreased step length - right;Decreased step length - left;Decreased stride length;Trunk flexed Gait velocity: decreased   General Gait Details: slow, labored ambulation; limited by fatigue; no overt LOB  Stairs            Wheelchair Mobility    Modified Rankin (Stroke Patients Only)       Balance Overall balance assessment: Needs assistance Sitting-balance support: No upper extremity supported;Feet supported Sitting balance-Leahy Scale: Fair     Standing balance support: Bilateral upper extremity supported;During functional activity Standing balance-Leahy Scale: Fair Standing balance comment: poor without UE support                             Pertinent Vitals/Pain Pain Assessment: 0-10 Pain Score: 9  Pain Location: ribs and back Pain Intervention(s): Limited activity within patient's tolerance;Monitored during session;Repositioned    Home Living Family/patient expects to be discharged to:: Private residence Living Arrangements: Alone Available Help at Discharge: Family;Friend(s);Available PRN/intermittently Type of Home: Apartment Home Access: Elevator     Home Layout: One level Home Equipment: Grab bars - tub/shower      Prior Function Level of Independence: Independent;Needs assistance      ADL's / Homemaking Assistance Needed: reports she hasn't been able to do laundry for "a while" as she keeps getting sick        Hand Dominance   Dominant Hand: Right    Extremity/Trunk Assessment   Upper Extremity Assessment Upper Extremity Assessment: Generalized weakness    Lower Extremity Assessment Lower Extremity Assessment: Generalized weakness    Cervical / Trunk Assessment Cervical / Trunk Assessment: Kyphotic  Communication  Communication: No difficulties  Cognition Arousal/Alertness: Awake/alert Behavior During Therapy: WFL for tasks  assessed/performed Overall Cognitive Status: Within Functional Limits for tasks assessed                                        General Comments      Exercises     Assessment/Plan    PT Assessment Patient needs continued PT services  PT Problem List Decreased strength;Decreased mobility;Decreased activity tolerance;Decreased balance;Pain;Decreased knowledge of use of DME       PT Treatment Interventions DME instruction;Therapeutic activities;Gait training;Therapeutic exercise;Patient/family education;Balance training;Neuromuscular re-education    PT Goals (Current goals can be found in the Care Plan section)  Acute Rehab PT Goals Patient Stated Goal: get better and go home. PT Goal Formulation: With patient Time For Goal Achievement: 04/26/18 Potential to Achieve Goals: Good    Frequency Min 3X/week   Barriers to discharge        Co-evaluation               AM-PAC PT "6 Clicks" Mobility  Outcome Measure Help needed turning from your back to your side while in a flat bed without using bedrails?: A Little Help needed moving from lying on your back to sitting on the side of a flat bed without using bedrails?: A Little Help needed moving to and from a bed to a chair (including a wheelchair)?: A Little Help needed standing up from a chair using your arms (e.g., wheelchair or bedside chair)?: A Little Help needed to walk in hospital room?: A Little Help needed climbing 3-5 steps with a railing? : A Lot 6 Click Score: 17    End of Session Equipment Utilized During Treatment: Gait belt Activity Tolerance: Patient tolerated treatment well;Patient limited by fatigue Patient left: in chair;with call bell/phone within reach Nurse Communication: Mobility status PT Visit Diagnosis: Unsteadiness on feet (R26.81);Other abnormalities of gait and mobility (R26.89);Muscle weakness (generalized) (M62.81)    Time: 1310-1340 PT Time Calculation (min) (ACUTE  ONLY): 30 min   Charges:     PT Treatments $Therapeutic Activity: 8-22 mins        Floria Raveling. Hartnett-Rands, MS, PT Per [place] [phone] 04/12/2018, 2:02 PM

## 2018-04-12 NOTE — Plan of Care (Signed)
  Problem: Acute Rehab PT Goals(only PT should resolve) Goal: Pt will Roll Supine to Side Outcome: Progressing Flowsheets (Taken 04/12/2018 1407) Pt will Roll Supine to Side: with supervision Goal: Pt Will Go Supine/Side To Sit Outcome: Progressing Flowsheets (Taken 04/12/2018 1407) Pt will go Supine/Side to Sit: with supervision Goal: Pt Will Go Sit To Supine/Side Outcome: Progressing Flowsheets (Taken 04/12/2018 1407) Pt will go Sit to Supine/Side: with supervision Goal: Patient Will Transfer Sit To/From Stand Outcome: Progressing Flowsheets (Taken 04/12/2018 1407) Patient will transfer sit to/from stand: with min guard assist Goal: Pt Will Transfer Bed To Chair/Chair To Bed Outcome: Progressing Flowsheets (Taken 04/12/2018 1407) Pt will Transfer Bed to Chair/Chair to Bed: min guard assist Goal: Pt Will Ambulate Outcome: Progressing Flowsheets (Taken 04/12/2018 1407) Pt will Ambulate: 50 feet; with supervision; with least restrictive assistive device    Pamala Hurry D. Hartnett-Rands, MS, PT Per [place] [phone] 04/12/2018

## 2018-04-13 LAB — COMPREHENSIVE METABOLIC PANEL
ALT: 26 U/L (ref 0–44)
AST: 42 U/L — ABNORMAL HIGH (ref 15–41)
Albumin: 2.8 g/dL — ABNORMAL LOW (ref 3.5–5.0)
Alkaline Phosphatase: 67 U/L (ref 38–126)
Anion gap: 10 (ref 5–15)
BUN: 10 mg/dL (ref 8–23)
CO2: 30 mmol/L (ref 22–32)
Calcium: 7.9 mg/dL — ABNORMAL LOW (ref 8.9–10.3)
Chloride: 94 mmol/L — ABNORMAL LOW (ref 98–111)
Creatinine, Ser: 0.72 mg/dL (ref 0.44–1.00)
GFR calc Af Amer: >60 mL/min (ref >60)
GFR calc non Af Amer: >60 mL/min (ref >60)
GLUCOSE: 195 mg/dL — AB (ref 70–99)
Potassium: 2.9 mmol/L — ABNORMAL LOW (ref 3.5–5.1)
Sodium: 134 mmol/L — ABNORMAL LOW (ref 135–145)
Total Bilirubin: 0.7 mg/dL (ref 0.3–1.2)
Total Protein: 6 g/dL — ABNORMAL LOW (ref 6.5–8.1)

## 2018-04-13 LAB — BLOOD GAS, ARTERIAL
Acid-Base Excess: 9.8 mmol/L — ABNORMAL HIGH (ref 0.0–2.0)
BICARBONATE: 33 mmol/L — AB (ref 20.0–28.0)
FIO2: 21
O2 Saturation: 82.2 %
Patient temperature: 36.9
pCO2 arterial: 40.3 mmHg (ref 32.0–48.0)
pH, Arterial: 7.527 — ABNORMAL HIGH (ref 7.350–7.450)
pO2, Arterial: 46.1 mmHg — ABNORMAL LOW (ref 83.0–108.0)

## 2018-04-13 LAB — CBC WITH DIFFERENTIAL/PLATELET
Abs Immature Granulocytes: 0.08 10*3/uL — ABNORMAL HIGH (ref 0.00–0.07)
Basophils Absolute: 0 10*3/uL (ref 0.0–0.1)
Basophils Relative: 0 %
EOS PCT: 0 %
Eosinophils Absolute: 0 10*3/uL (ref 0.0–0.5)
HCT: 27.5 % — ABNORMAL LOW (ref 36.0–46.0)
Hemoglobin: 8.9 g/dL — ABNORMAL LOW (ref 12.0–15.0)
Immature Granulocytes: 1 %
Lymphocytes Relative: 6 %
Lymphs Abs: 0.4 10*3/uL — ABNORMAL LOW (ref 0.7–4.0)
MCH: 29.5 pg (ref 26.0–34.0)
MCHC: 32.4 g/dL (ref 30.0–36.0)
MCV: 91.1 fL (ref 80.0–100.0)
MONO ABS: 0.3 10*3/uL (ref 0.1–1.0)
Monocytes Relative: 5 %
Neutro Abs: 5.7 10*3/uL (ref 1.7–7.7)
Neutrophils Relative %: 88 %
Platelets: 186 10*3/uL (ref 150–400)
RBC: 3.02 MIL/uL — ABNORMAL LOW (ref 3.87–5.11)
RDW: 13.1 % (ref 11.5–15.5)
WBC: 6.5 10*3/uL (ref 4.0–10.5)
nRBC: 0 % (ref 0.0–0.2)

## 2018-04-13 LAB — MAGNESIUM: Magnesium: 1.5 mg/dL — ABNORMAL LOW (ref 1.7–2.4)

## 2018-04-13 LAB — HIV ANTIBODY (ROUTINE TESTING W REFLEX): HIV Screen 4th Generation wRfx: NONREACTIVE

## 2018-04-13 MED ORDER — MAGNESIUM OXIDE 400 (241.3 MG) MG PO TABS
800.0000 mg | ORAL_TABLET | Freq: Two times a day (BID) | ORAL | Status: DC
  Administered 2018-04-13 – 2018-04-15 (×5): 800 mg via ORAL
  Filled 2018-04-13 (×5): qty 2

## 2018-04-13 MED ORDER — POTASSIUM CHLORIDE 10 MEQ/100ML IV SOLN
10.0000 meq | INTRAVENOUS | Status: AC
  Administered 2018-04-13 (×4): 10 meq via INTRAVENOUS
  Filled 2018-04-13 (×5): qty 100

## 2018-04-13 NOTE — Progress Notes (Signed)
Physical Therapy Treatment Patient Details Name: Brooke Deleon MRN: [id number] DOB: [birth year] Today's Date: 04/13/2018    History of Present Illness Patient is a 63 year old female admitted 04/11/2018 with symptoms of generalized weakness and diagnosis of influenza A. PMH: pressure injury of skin Stage II sacrum this admission, depression, anxiety, COPD, HTN, fibromylagia, GERDhistory of UTI, reverse total shoulder arthroplasty.    PT Comments    Patient asleep in bed when arrive. Patient able to be aroused and agreeable to participating in therapy today. Patient performed bed mobility with supervision. Transfers and ambulation w/ RW and min guard for safety. 4LPM O2 throughout session. Upon returning from 80 feet of ambulation, patient requested to go to the bathroom. Patient completed toileting and hygiene with supervision, including washing her hands. Patient returned to recliner and was set up to eat her meal.  Patient would continue to benefit from skilled physical therapy in current environment and next venue to continue return to prior function and increase strength, endurance, balance, coordination, and functional mobility and gait skills.     Follow Up Recommendations  Home health PT;Supervision - Intermittent     Equipment Recommendations  Rolling walker with 5" wheels    Recommendations for Other Services       Precautions / Restrictions Precautions Precautions: Fall Restrictions Weight Bearing Restrictions: No    Mobility  Bed Mobility Overal bed mobility: Needs Assistance Bed Mobility: Supine to Sit     Supine to sit: HOB elevated;Supervision        Transfers Overall transfer level: Needs assistance Equipment used: Rolling walker (2 wheeled) Transfers: Sit to/from [facility] Sit to Stand: Min guard Stand pivot transfers: Min guard       General transfer comment: increased time  Ambulation/Gait Ambulation/Gait assistance: Min  guard Gait Distance (Feet): 100 Feet Assistive device: Rolling walker (2 wheeled) Gait Pattern/deviations: Decreased step length - right;Decreased step length - left;Decreased stride length;Trunk flexed;Step-through pattern;Decreased stance time - left Gait velocity: decreased   General Gait Details: slow, labored ambulation; frequent extraneous movements of Lt > Rt leg, patient stating she needed to move legs around due to fibromyalgia pain while walking; 4LPM O2   Stairs             Wheelchair Mobility    Modified Rankin (Stroke Patients Only)       Balance Overall balance assessment: Needs assistance Sitting-balance support: No upper extremity supported;Feet supported Sitting balance-Leahy Scale: Fair     Standing balance support: Bilateral upper extremity supported;During functional activity Standing balance-Leahy Scale: Fair Standing balance comment: poor without UE support                            Cognition Arousal/Alertness: Awake/alert Behavior During Therapy: WFL for tasks assessed/performed Overall Cognitive Status: Within Functional Limits for tasks assessed                                        Exercises      General Comments        Pertinent Vitals/Pain Pain Score: 6  Pain Location: ribs and back Pain Intervention(s): Limited activity within patient's tolerance;Monitored during session;Repositioned    Home Living                      Prior Function  PT Goals (current goals can now be found in the care plan section) Acute Rehab PT Goals Patient Stated Goal: get better and go home. PT Goal Formulation: With patient Time For Goal Achievement: 04/26/18 Potential to Achieve Goals: Good Progress towards PT goals: Progressing toward goals    Frequency    Min 3X/week      PT Plan Current plan remains appropriate    Co-evaluation              AM-PAC PT "6 Clicks" Mobility    Outcome Measure  Help needed turning from your back to your side while in a flat bed without using bedrails?: A Little Help needed moving from lying on your back to sitting on the side of a flat bed without using bedrails?: A Little Help needed moving to and from a bed to a chair (including a wheelchair)?: A Little Help needed standing up from a chair using your arms (e.g., wheelchair or bedside chair)?: A Little Help needed to walk in hospital room?: A Little Help needed climbing 3-5 steps with a railing? : A Lot 6 Click Score: 17    End of Session Equipment Utilized During Treatment: Gait belt Activity Tolerance: Patient tolerated treatment well;Patient limited by fatigue Patient left: in chair;with call bell/phone within reach Nurse Communication: Mobility status PT Visit Diagnosis: Unsteadiness on feet (R26.81);Other abnormalities of gait and mobility (R26.89);Muscle weakness (generalized) (M62.81)     Time: 1200-1230 PT Time Calculation (min) (ACUTE ONLY): 30 min  Charges:  $Gait Training: 8-22 mins $Therapeutic Activity: 8-22 mins                     Floria Raveling. Hartnett-Rands, MS, PT Per [place] #[id number] 04/13/2018, 12:58 PM

## 2018-04-13 NOTE — Progress Notes (Addendum)
PROGRESS NOTE  Brooke Deleon QAS:[id number] DOB: 1955/11/07 DOA: 04/11/2018 PCP: Glenda Chroman, MD  HPI/Recap of past 24 hours: Patient seen at examined at bedside still complaining of feeling weak and shaky. 63 year old female with past medical history of COPD she denies using home O2 admitted on 04/11/2018 with cough and shortness of breath found to have acute kidney injury and influenza A infection she has been in contact and droplet isolation.  She feels better but still feeling weak and shaky.  She denies any abdominal pain diarrhea or constipation  Assessment/Plan: Active Problems:   Influenza A   Pressure injury of skin   1.  Influenza A.  Patient will continue Tamiflu twice a day we will continue droplet infection steroid was added yesterday.  2.  Acute kidney injury has resolved with IV hydration  3.  Hyponatremia proving, we will continue IV fluid and monitoring of sodium level  4.  Hypoxia patient has COPD but has not been on home O2 her oxygen level on room air is 82% by blood gas and 84 by pulse oximeter she likely will be discharged home on home O2.  5.  Depression continue Celexa and Cymbalta  6.  Pressure ulcer on the both buttocks area present on admission wound nurse was consulted we will continue management as recommended  7.  Anemia of undetermined origin hemoglobin is currently 8.9 her baseline is 12.  8.  Debility.  Patient still feels weak ,likely will need home health nurse as well as home PT on discharge  9. hypokalemia.  Potassium level was 2.9 this morning she will be replaced with potassium rider 10 mEq x 4 doses.  10.  Hypomagnesemia mild.  Will replace with magnesium oxide p.o. recheck magnesium level in the morning  11.  Protein malnutrition Albumin is 2.8  total protein is 6.0.  We will start protein supplements with Beneprotein  Code Status: Full  Severity of Illness: The appropriate patient status for this patient is INPATIENT. Inpatient status  is judged to be reasonable and necessary in order to provide the required intensity of service to ensure the patient's safety. The patient's presenting symptoms, physical exam findings, and initial radiographic and laboratory data in the context of their chronic comorbidities is felt to place them at high risk for further clinical deterioration. Furthermore, it is not anticipated that the patient will be medically stable for discharge from the hospital within 2 midnights of admission. The following factors support the patient status of inpatient.   Patient has a potassium level of 2.9 which is dangerously low can result in arrhythmia and further complications.  Patient is receiving IV potassium rider she requires extra stay in the hospital due to this   * I certify that at the point of admission it is my clinical judgment that the patient will require inpatient hospital care spanning beyond 2 midnights from the point of admission due to high intensity of service, high risk for further deterioration and high frequency of surveillance required.*    Family Communication: At bedside discussed with patient  Disposition Plan: Home with home health and PT   Consultants:  None  Procedures:  None  Antimicrobials:  None  DVT prophylaxis: Subacute heparin   Objective: Vitals:   04/12/18 2030 04/13/18 0251 04/13/18 0728 04/13/18 0753  BP:   (!) 151/98   Pulse:   91   Resp:   16   Temp:   98.1 F (36.7 C)   TempSrc:  Oral   SpO2: (!) 81% (!) 84% 95% 93%  Weight:      Height:        Intake/Output Summary (Last 24 hours) at 04/13/2018 1230 Last data filed at 04/13/2018 0900 Gross per 24 hour  Intake 1920 ml  Output 1800 ml  Net 120 ml   Filed Weights   04/11/18 2328 04/12/18 0136  Weight: 59.9 kg 55 kg   Body mass index is 23.68 kg/m.  Exam:  . General: 63 y.o. year-old female well developed well nourished in no acute distress.  Alert and oriented x3.  Thin  frail . Cardiovascular: Regular rate and rhythm with no rubs or gallops.  No thyromegaly or JVD noted.   Marland Kitchen Respiratory: Bilateral adventitious breath sounds more on the left good inspiratory effort. . Abdomen: Soft nontender nondistended with normal bowel sounds x4 quadrants. . Musculoskeletal: No lower extremity edema. 2/4 pulses in all 4 extremities. . Skin: No ulcerative lesions noted or rashes, skin tears noted . Psychiatry: Mood is appropriate for condition and setting    Data Reviewed: CBC: Recent Labs  Lab 04/11/18 2217 04/12/18 0229 04/13/18 0629  WBC 9.7 8.2 6.5  NEUTROABS  --   --  5.7  HGB 9.9* 8.8* 8.9*  HCT 30.9* 26.7* 27.5*  MCV 90.1 90.5 91.1  PLT 178 152 696   Basic Metabolic Panel: Recent Labs  Lab 04/11/18 2217 04/12/18 0229 04/13/18 0629  NA 127* 130* 134*  K 3.7 3.7 2.9*  CL 89* 94* 94*  CO2 26 26 30   GLUCOSE 126* 108* 195*  BUN 46* 38* 10  CREATININE 2.66* 2.05* 0.72  CALCIUM 7.8* 7.6* 7.9*  MG  --   --  1.5*   GFR: Estimated Creatinine Clearance: 57.5 mL/min (by C-G formula based on SCr of 0.72 mg/dL). Liver Function Tests: Recent Labs  Lab 04/11/18 2217 04/12/18 0229 04/13/18 0629  AST 59* 51* 42*  ALT 29 26 26   ALKPHOS 78 64 67  BILITOT 0.7 0.6 0.7  PROT 6.4* 5.7* 6.0*  ALBUMIN 3.3* 2.8* 2.8*   No results for input(s): LIPASE, AMYLASE in the last 168 hours. No results for input(s): AMMONIA in the last 168 hours. Coagulation Profile: No results for input(s): INR, PROTIME in the last 168 hours. Cardiac Enzymes: No results for input(s): CKTOTAL, CKMB, CKMBINDEX, TROPONINI in the last 168 hours. BNP (last 3 results) No results for input(s): PROBNP in the last 8760 hours. HbA1C: No results for input(s): HGBA1C in the last 72 hours. CBG: No results for input(s): GLUCAP in the last 168 hours. Lipid Profile: No results for input(s): CHOL, HDL, LDLCALC, TRIG, CHOLHDL, LDLDIRECT in the last 72 hours. Thyroid Function Tests: Recent  Labs    04/11/18 2217  TSH 0.887   Anemia Panel: No results for input(s): VITAMINB12, FOLATE, FERRITIN, TIBC, IRON, RETICCTPCT in the last 72 hours. Urine analysis:    Component Value Date/Time   COLORURINE YELLOW 04/11/2018 2311   APPEARANCEUR HAZY (A) 04/11/2018 2311   LABSPEC 1.013 04/11/2018 2311   PHURINE 5.0 04/11/2018 2311   GLUCOSEU NEGATIVE 04/11/2018 2311   HGBUR MODERATE (A) 04/11/2018 2311   BILIRUBINUR NEGATIVE 04/11/2018 2311   [place] 04/11/2018 2311   PROTEINUR 30 (A) 04/11/2018 2311   NITRITE NEGATIVE 04/11/2018 2311   LEUKOCYTESUR MODERATE (A) 04/11/2018 2311   Sepsis Labs: @LABRCNTIP (procalcitonin:4,lacticidven:4)  )No results found for this or any previous visit (from the past 240 hour(s)).    Studies: No results found.  Scheduled Meds: .  citalopram  40 mg Oral Daily  . DULoxetine  30 mg Oral BH-q7a  . ferrous sulfate  325 mg Oral Q breakfast  . gabapentin  400 mg Oral QID  . ipratropium-albuterol  3 mL Nebulization Q6H  . magnesium oxide  800 mg Oral BID  . metoprolol succinate  50 mg Oral Daily  . oseltamivir  30 mg Oral QHS  . oxybutynin  15 mg Oral QHS  . pantoprazole  40 mg Oral BID AC  . predniSONE  50 mg Oral Q breakfast    Continuous Infusions: . lactated ringers 100 mL/hr at 04/13/18 0905  . potassium chloride       LOS: 2 days     Cristal Deer, MD [facility]  To reach me or the doctor on call, go to: [email or web address] Password [email or web address]  04/13/2018, 12:30 PM

## 2018-04-13 NOTE — Care Management Important Message (Signed)
Important Message  Patient Details  Name: Brooke Deleon MRN: [id number] Date of Birth: Nov 05, 1955   Medicare Important Message Given:  Yes    Sherald Barge, RN 04/13/2018, 12:40 PM

## 2018-04-13 NOTE — Care Management Note (Addendum)
Case Management Note  Patient Details  Name: Brooke Deleon MRN: [id number] Date of Birth: Oct 27, 1955  Subjective/Objective:    Admitted with flu. Pt from home, lives alone, has neighbor as support. Pt has insurance and PCP. Does not drive. PT recommends HH PT and RW. Pt agreeable to [facility] PT but wants a cane, not walker. CMS provider lists given and pt has no prefernce to [facility] or DME providers. Pt on oxygen acutely, will need to wean prior to DC.               Action/Plan: DC home this weekend. Referral for [facility] sent to [employer], [facility] rep. Referral for cane given to [facility] with [facility], will be delivered to pt room prior to DC.  Expected Discharge Date:  04/15/18               Expected Discharge Plan:  [place]  In-House Referral:  NA  Discharge planning Services  CM Consult  Post Acute Care Choice:  Durable Medical Equipment, Home Health Choice offered to:  Patient  DME Arranged:  Kasandra Knudsen DME Agency:  [facility]  HH Arranged:  PT [place] Agency:  [place] ([facility])  Status of Service:  Completed, signed off  If discussed at [place] of Stay Meetings, dates discussed:    Additional Comments:  Sherald Barge, RN 04/13/2018, 12:14 PM

## 2018-04-14 DIAGNOSIS — E440 Moderate protein-calorie malnutrition: Secondary | ICD-10-CM | POA: Diagnosis present

## 2018-04-14 DIAGNOSIS — F329 Major depressive disorder, single episode, unspecified: Secondary | ICD-10-CM | POA: Diagnosis present

## 2018-04-14 DIAGNOSIS — F32A Depression, unspecified: Secondary | ICD-10-CM | POA: Diagnosis present

## 2018-04-14 DIAGNOSIS — I10 Essential (primary) hypertension: Secondary | ICD-10-CM | POA: Diagnosis present

## 2018-04-14 DIAGNOSIS — N390 Urinary tract infection, site not specified: Secondary | ICD-10-CM | POA: Diagnosis present

## 2018-04-14 LAB — BASIC METABOLIC PANEL
Anion gap: 11 (ref 5–15)
BUN: 7 mg/dL — AB (ref 8–23)
CO2: 31 mmol/L (ref 22–32)
Calcium: 8.5 mg/dL — ABNORMAL LOW (ref 8.9–10.3)
Chloride: 94 mmol/L — ABNORMAL LOW (ref 98–111)
Creatinine, Ser: 0.62 mg/dL (ref 0.44–1.00)
GFR calc Af Amer: >60 mL/min (ref >60)
GFR calc non Af Amer: >60 mL/min (ref >60)
Glucose, Bld: 121 mg/dL — ABNORMAL HIGH (ref 70–99)
Potassium: 3.4 mmol/L — ABNORMAL LOW (ref 3.5–5.1)
Sodium: 136 mmol/L (ref 135–145)

## 2018-04-14 LAB — MAGNESIUM: Magnesium: 1.7 mg/dL (ref 1.7–2.4)

## 2018-04-14 MED ORDER — FLUTICASONE-SALMETEROL 250-50 MCG/DOSE IN AEPB: 1.0000 | INHALATION_SPRAY | Freq: Two times a day (BID) | RESPIRATORY_TRACT | 0 refills | Status: DC

## 2018-04-14 MED ORDER — OSELTAMIVIR PHOSPHATE 30 MG PO CAPS: 30.0000 mg | ORAL_CAPSULE | Freq: Every day | ORAL | 0 refills | Status: DC

## 2018-04-14 MED ORDER — RESOURCE INSTANT PROTEIN PO PWD PACKET: 1.0000 | Freq: Three times a day (TID) | ORAL | Status: DC

## 2018-04-14 MED ORDER — POTASSIUM CHLORIDE CRYS ER 20 MEQ PO TBCR
40.0000 meq | EXTENDED_RELEASE_TABLET | Freq: Once | ORAL | Status: AC
  Administered 2018-04-14: 40 meq via ORAL
  Filled 2018-04-14: qty 2

## 2018-04-14 MED ORDER — PRO-STAT SUGAR FREE PO LIQD
30.0000 mL | Freq: Three times a day (TID) | ORAL | Status: DC
  Administered 2018-04-14 – 2018-04-15 (×3): 30 mL via ORAL
  Filled 2018-04-14: qty 30

## 2018-04-14 MED ORDER — PREDNISONE 50 MG PO TABS: 50.0000 mg | ORAL_TABLET | Freq: Every day | ORAL | 0 refills | Status: AC

## 2018-04-14 MED ORDER — PRO-STAT SUGAR FREE PO LIQD: 30.0000 mL | Freq: Three times a day (TID) | ORAL | 0 refills | Status: DC

## 2018-04-14 MED ORDER — MAGNESIUM OXIDE 400 (241.3 MG) MG PO TABS: 400.0000 mg | ORAL_TABLET | Freq: Two times a day (BID) | ORAL | 0 refills | Status: DC

## 2018-04-14 NOTE — Discharge Summary (Addendum)
Discharge Summary  Brooke Deleon YWV:[id number] DOB: 09-28-1955  PCP: Glenda Chroman, MD  Admit date: 04/11/2018 Discharge date: 04/14/2018  Time spent: 45 minutes  Recommendations for Outpatient Follow-up:  1. Follow-up primary care provider, 2. Use oxygen at home 3. Complete Tamiflu for 2 more days  Discharge Diagnoses:  Active Hospital Problems   Diagnosis Date Noted  . Influenza A 04/11/2018  . Hypertension 04/14/2018  . Acute lower UTI 04/14/2018  . Moderate protein malnutrition ([place]) 04/14/2018  . Depression 04/14/2018  . Pressure injury of skin 04/12/2018  . S/P reverse total shoulder arthroplasty, right 04/27/2017  . Chronic pain disorder 09/15/2016  . PTSD (post-traumatic stress disorder) 09/15/2016  . Anemia   . COPD with acute exacerbation ([place]) 02/29/2016    Resolved Hospital Problems  No resolved problems to display.    Discharge Condition: Improved and stable  Diet recommendation: High-protein diet  Vitals:   04/14/18 0757 04/14/18 1347  BP:    Pulse:    Resp:    Temp:    SpO2: (!) 80% 91%    History of present illness:  *63 year old female with past medical history of COPD she denies using home O2 admitted on 04/11/2018 with cough and shortness of breath found to have acute kidney injury and influenza A infection she has been in contact and droplet isolation.  She feels better but still feeling weak and shaky.  She denies any abdominal pain diarrhea or constipation**   Hospital Course:  Principal Problem:   Influenza A Active Problems:   COPD with acute exacerbation (HCC)   Anemia   Chronic pain disorder   PTSD (post-traumatic stress disorder)   S/P reverse total shoulder arthroplasty, right   Pressure injury of skin   Hypertension   Acute lower UTI   Moderate protein malnutrition (HCC)   Depression   1.  Influenza A.  Patient will continue Tamiflu  for 2 more days2.  Acute kidney injury has resolved with IV hydration  3.  Hyponatremia  proving, we will continue IV fluid and monitoring of sodium level  4.  Hypoxia patient has COPD but has not been on home O2 her oxygen level on room air is 82% and PO2 was 46.1 by blood gas and 84 by pulse oximeter she liwill be discharged home on home O2 2 L/min.  5.  Depression continue Celexa and Cymbalta  6.  Pressure ulcer on the both buttocks area present on admission wound nurse was consulted we will continue management as recommended  7.  Anemia of undetermined origin hemoglobin is currently 8.9 her baseline is 12.  8.  Debility.  Patient still feels weak ordered health nurse as well as home PT on discharge  9. hypokalemia.  Potassium level was 2.9 this morning she will be replaced with potassium rider 10 mEq x 4 doses.  10.  Hypomagnesemia mild.  Will replace with magnesium oxide p.o. recheck magnesium level in the morning  11.  Protein malnutrition Albumin is 2.8  total protein is 6.0.  We will start protein supplements with Beneprotein  Code Status: Full Procedures:  None  Consultations:  None  Discharge Exam: BP (!) 144/82 (BP Location: Left Arm)   Pulse 86   Temp 98.1 F (36.7 C) (Oral)   Resp 18   Ht 5' (1.524 m)   Wt 55 kg   SpO2 91%   BMI 23.68 kg/m   General: Frail malnourished Cardiovascular: Regular rate and rhythm no murmur Respiratory: Bilateral adventitious sounds more  on the right  Discharge Instructions You were cared for by a hospitalist during your hospital stay. If you have any questions about your discharge medications or the care you received while you were in the hospital after you are discharged, you can call the unit and asked to speak with the hospitalist on call if the hospitalist that took care of you is not available. Once you are discharged, your primary care physician will handle any further medical issues. Please note that NO REFILLS for any discharge medications will be authorized once you are discharged, as it is imperative  that you return to your primary care physician (or establish a relationship with a primary care physician if you do not have one) for your aftercare needs so that they can reassess your need for medications and monitor your lab values.  Discharge Instructions    Call MD for:  temperature >100.4   Complete by:  As directed    Diet - low sodium heart healthy   Complete by:  As directed    Discharge instructions   Complete by:  As directed    Follow-up primary care provider   Increase activity slowly   Complete by:  As directed      Allergies as of 04/14/2018      Reactions   Codeine Rash   Dilaudid [hydromorphone Hcl] Hives      Medication List    STOP taking these medications   cimetidine 400 MG tablet Commonly known as:  TAGAMET   diphenhydrAMINE 25 MG tablet Commonly known as:  BENADRYL   HYDROmorphone 2 MG tablet Commonly known as:  DILAUDID   tiZANidine 2 MG tablet Commonly known as:  ZANAFLEX     TAKE these medications   albuterol 108 (90 Base) MCG/ACT inhaler Commonly known as:  PROVENTIL HFA;VENTOLIN HFA Inhale 1-2 puffs into the lungs every 6 (six) hours as needed for wheezing or shortness of breath.   ALPRAZolam 1 MG tablet Commonly known as:  XANAX Take 1 mg by mouth 3 (three) times daily.   cephALEXin 500 MG capsule Commonly known as:  KEFLEX Take 1 capsule (500 mg total) by mouth 4 (four) times daily.   cholecalciferol 1000 units tablet Commonly known as:  VITAMIN D Take 1,000 Units by mouth daily.   citalopram 40 MG tablet Commonly known as:  CELEXA Take 40 mg by mouth daily.   desmopressin 0.2 MG tablet Commonly known as:  DDAVP Take 0.2 mg by mouth at bedtime.   DULoxetine 30 MG capsule Commonly known as:  CYMBALTA Take 30 mg by mouth every morning.   feeding supplement (PRO-STAT SUGAR FREE 64) Liqd Take 30 mLs by mouth 3 (three) times daily with meals.   ferrous sulfate 325 (65 FE) MG tablet Take 325 mg by mouth daily with breakfast.    fluticasone 50 MCG/ACT nasal spray Commonly known as:  FLONASE Place 1 spray into both nostrils daily as needed for allergies.   Fluticasone-Salmeterol 250-50 MCG/DOSE Aepb Commonly known as:  Advair Diskus Inhale 1 puff into the lungs 2 (two) times daily.   gabapentin 800 MG tablet Commonly known as:  NEURONTIN Take 800 mg by mouth 4 (four) times daily.   hydrOXYzine 25 MG tablet Commonly known as:  ATARAX/VISTARIL Take 1 tablet (25 mg total) by mouth every 6 (six) hours.   ipratropium 0.06 % nasal spray Commonly known as:  ATROVENT Place 1 spray into both nostrils as needed.   magnesium oxide 400 (241.3 Mg) MG tablet  Commonly known as:  MAG-OX Take 1 tablet (400 mg total) by mouth 2 (two) times daily.   methocarbamol 500 MG tablet Commonly known as:  ROBAXIN Take 500-1,000 mg by mouth 3 (three) times daily.   metoprolol succinate 100 MG 24 hr tablet Commonly known as:  TOPROL-XL Take 50 mg by mouth 2 (two) times daily.   multivitamin with minerals Tabs tablet Take 1 tablet by mouth daily.   oseltamivir 30 MG capsule Commonly known as:  TAMIFLU Take 1 capsule (30 mg total) by mouth at bedtime.   oxybutynin 15 MG 24 hr tablet Commonly known as:  DITROPAN XL Take by mouth.   oxyCODONE-acetaminophen 10-325 MG tablet Commonly known as:  PERCOCET Take 1 tablet by mouth every 8 (eight) hours.   pantoprazole 40 MG tablet Commonly known as:  PROTONIX TAKE (1) TABLET TWICE A DAY BEFORE MEALS. What changed:    how much to take  how to take this  when to take this  additional instructions   predniSONE 50 MG tablet Commonly known as:  DELTASONE Take 1 tablet (50 mg total) by mouth daily with breakfast for 3 days. Start taking on:  April 15, 2018   traMADol 50 MG tablet Commonly known as:  ULTRAM Take 50-100 mg by mouth every 6 (six) hours as needed for moderate pain.   vitamin B-12 1000 MCG tablet Commonly known as:  CYANOCOBALAMIN Take 1,000 mcg by mouth  daily.   vitamin C 500 MG tablet Commonly known as:  ASCORBIC ACID Take 1,000 mg by mouth daily.            Durable Medical Equipment  (From admission, onward)         Start     Ordered   04/14/18 1420  DME Oxygen  Once    Comments:  po2 81  Question Answer Comment  Mode or (Route) Nasal cannula   Liters per Minute 2   Oxygen conserving device Yes   Oxygen delivery system Gas      04/14/18 1435   04/13/18 1212  For home use only DME Cane  Once     04/13/18 1211         Allergies  Allergen Reactions  . Codeine Rash  . Dilaudid [Hydromorphone Hcl] Hives   Follow-up Information    [facility] Follow up.   Specialty:  [facility]           The results of significant diagnostics from this hospitalization (including imaging, microbiology, ancillary and laboratory) are listed below for reference.    Significant Diagnostic Studies: Dg Chest 2 View  Result Date: 04/11/2018 CLINICAL DATA:  Malaise and fatigue EXAM: CHEST - 2 VIEW COMPARISON:  03/10/2020, 10/09/2016 FINDINGS: Streaky bibasilar atelectasis. No focal consolidation or effusion. Normal cardiomediastinal silhouette. No pneumothorax. Status post right shoulder replacement. IMPRESSION: Streaky atelectasis at the bases.  No focal pulmonary infiltrate. Electronically Signed   By: Donavan Foil M.D.   On: 04/11/2018 21:37   Ct Head Wo Contrast  Result Date: 04/11/2018 CLINICAL DATA:  Increased weakness EXAM: CT HEAD WITHOUT CONTRAST TECHNIQUE: Contiguous axial images were obtained from the base of the skull through the vertex without intravenous contrast. COMPARISON:  CT 03/10/2018 FINDINGS: Brain: No acute territorial infarction, hemorrhage or intracranial mass. Atrophy and mild small vessel ischemic changes of the white matter. Stable ventricle size Vascular: No hyperdense vessels.  Carotid vascular calcification Skull: Normal. Negative for fracture or focal lesion. Sinuses/Orbits: No  acute finding.  Other: None IMPRESSION: 1. No CT evidence for acute intracranial abnormality. 2. Atrophy and mild small vessel ischemic changes of the white matter Electronically Signed   By: Donavan Foil M.D.   On: 04/11/2018 21:35    Microbiology: No results found for this or any previous visit (from the past 240 hour(s)).   Labs: Basic Metabolic Panel: Recent Labs  Lab 04/11/18 2217 04/12/18 0229 04/13/18 0629 04/14/18 0619  NA 127* 130* 134* 136  K 3.7 3.7 2.9* 3.4*  CL 89* 94* 94* 94*  CO2 26 26 30 31   GLUCOSE 126* 108* 195* 121*  BUN 46* 38* 10 7*  CREATININE 2.66* 2.05* 0.72 0.62  CALCIUM 7.8* 7.6* 7.9* 8.5*  MG  --   --  1.5* 1.7   Liver Function Tests: Recent Labs  Lab 04/11/18 2217 04/12/18 0229 04/13/18 0629  AST 59* 51* 42*  ALT 29 26 26   ALKPHOS 78 64 67  BILITOT 0.7 0.6 0.7  PROT 6.4* 5.7* 6.0*  ALBUMIN 3.3* 2.8* 2.8*   No results for input(s): LIPASE, AMYLASE in the last 168 hours. No results for input(s): AMMONIA in the last 168 hours. CBC: Recent Labs  Lab 04/11/18 2217 04/12/18 0229 04/13/18 0629  WBC 9.7 8.2 6.5  NEUTROABS  --   --  5.7  HGB 9.9* 8.8* 8.9*  HCT 30.9* 26.7* 27.5*  MCV 90.1 90.5 91.1  PLT 178 152 186   Cardiac Enzymes: No results for input(s): CKTOTAL, CKMB, CKMBINDEX, TROPONINI in the last 168 hours. BNP: BNP (last 3 results) No results for input(s): BNP in the last 8760 hours.  ProBNP (last 3 results) No results for input(s): PROBNP in the last 8760 hours.  CBG: No results for input(s): GLUCAP in the last 168 hours.     Signed:  Cristal Deer, MD [facility] 04/14/2018, 2:36 PM

## 2018-04-14 NOTE — Progress Notes (Signed)
LCSW spoke with [facility] [employer]. He needed contact information for [facility] [phone] LCSW provided. This patient will be DC with home health   [place] LCSW [phone]

## 2018-04-14 NOTE — Progress Notes (Signed)
SATURATION QUALIFICATIONS: (This note is used to comply with regulatory documentation for home oxygen)  Patient Saturations on Room Air at Rest = 88%  Patient Saturations on Room Air while Ambulating =81%  Patient Saturations on 2Liters of oxygen while Ambulating = 93%

## 2018-04-14 NOTE — Care Management Note (Signed)
Case Management Note  Patient Details  Name: Brooke Deleon MRN: [id number] Date of Birth: 21-Jun-1955  Subjective/Objective    HH was previously set up with [facility]. O2 orders, [facility] scheduled delivery                Action/Plan:   Expected Discharge Date:  04/14/18               Expected Discharge Plan:  [place]  In-House Referral:  NA  Discharge planning Services  CM Consult  Post Acute Care Choice:  Durable Medical Equipment, Home Health Choice offered to:  Patient  DME Arranged:  Kasandra Knudsen, Oxygen DME Agency:  [facility]  HH Arranged:  PT [place] Agency:  [place] ([facility])  Status of Service:  Completed, signed off  If discussed at [place] of Stay Meetings, dates discussed:    Additional Comments:  Latanya Maudlin, RN 04/14/2018, 3:46 PM

## 2018-04-15 NOTE — Discharge Summary (Signed)
PROGRESS NOTE  Brooke Deleon YFV:[id number] DOB: [birth year] DOA: 04/11/2018 PCP: Glenda Chroman, MD  HPI/Recap of past 24 hours: Patient seen at examined at bedside still complaining of feeling weak and shaky. 63 year old female with past medical history of COPD she denies using home O2 admitted on 04/11/2018 with cough and shortness of breath found to have acute kidney injury and influenza A infection she has been in contact and droplet isolation.  She feels better but still feeling weak and shaky.  She denies any abdominal pain diarrhea or constipation  Assessment/Plan: Principal Problem:   Influenza A Active Problems:   COPD with acute exacerbation (HCC)   Anemia   Chronic pain disorder   PTSD (post-traumatic stress disorder)   S/P reverse total shoulder arthroplasty, right   Pressure injury of skin   Hypertension   Acute lower UTI   Moderate protein malnutrition (HCC)   Depression   1.  Influenza A.  Patient will continue Tamiflu twice a day we will continue droplet infection steroid was added yesterday.  2.  Acute kidney injury has resolved with IV hydration  3.  Hyponatremia proving, we will continue IV fluid and monitoring of sodium level  4.  Hypoxia patient has COPD but has not been on home O2 her oxygen level on room air is 82% by blood gas and 84 by pulse oximeter she likely will be discharged home on home O2.  5.  Depression continue Celexa and Cymbalta  6.  Pressure ulcer on the both buttocks area present on admission wound nurse was consulted we will continue management as recommended  7.  Anemia Nutritional anemia due to poor p.o. intake she also has low albumin and low protein.  Hemoglobin is currently 8.9 her baseline is 12.  Patient will be started on protein supplement.  Iron level and hemoglobin will need to be monitored as outpatient by her primary care provider  8.  Debility.  Patient still feels weak ,likely will need home health nurse as well as home PT on  discharge  9. hypokalemia.  Potassium level was 2.9 this morning she will be replaced with potassium rider 10 mEq x 4 doses.  10.  Hypomagnesemia mild.  Will replace with magnesium oxide p.o. recheck magnesium level in the morning  11.  Protein malnutrition Albumin is 2.8  total protein is 6.0.  We will start protein supplements with Beneprotein  12.  Patient is being discharged today on home O2 in improved condition.  Discharge summary was done yesterday marked 08/27/2018 when she was originally scheduled to be discharged home  Code Status: Full  Severity of Illness: The appropriate patient status for this patient is INPATIENT. Inpatient status is judged to be reasonable and necessary in order to provide the required intensity of service to ensure the patient's safety. The patient's presenting symptoms, physical exam findings, and initial radiographic and laboratory data in the context of their chronic comorbidities is felt to place them at high risk for further clinical deterioration. Furthermore, it is not anticipated that the patient will be medically stable for discharge from the hospital within 2 midnights of admission. The following factors support the patient status of inpatient.   Patient has a potassium level of 2.9 which is dangerously low can result in arrhythmia and further complications.  Patient is receiving IV potassium rider she requires extra stay in the hospital due to this   * I certify that at the point of admission it is my clinical judgment  that the patient will require inpatient hospital care spanning beyond 2 midnights from the point of admission due to high intensity of service, high risk for further deterioration and high frequency of surveillance required.*    Family Communication: At bedside discussed with patient  Disposition Plan: Home with home health and PT   Consultants:  None  Procedures:  None  Antimicrobials:  None  DVT prophylaxis: Subacute  heparin   Objective: Vitals:   04/14/18 2126 04/15/18 0401 04/15/18 0600 04/15/18 0807  BP:   (!) 150/93   Pulse:   89   Resp:   17   Temp:   98.3 F (36.8 C)   TempSrc:   Other (Comment)   SpO2: 92% 91% 100% 93%  Weight:      Height:        Intake/Output Summary (Last 24 hours) at 04/15/2018 1000 Last data filed at 04/15/2018 0600 Gross per 24 hour  Intake 3000.79 ml  Output 1650 ml  Net 1350.79 ml   Filed Weights   04/11/18 2328 04/12/18 0136  Weight: 59.9 kg 55 kg   Body mass index is 23.68 kg/m.  Exam:  . General: 63 y.o. year-old female well developed well nourished in no acute distress.  Alert and oriented x3.  Thin frail . Cardiovascular: Regular rate and rhythm with no rubs or gallops.  No thyromegaly or JVD noted.   Marland Kitchen Respiratory: Bilateral adventitious breath sounds more on the left good inspiratory effort. . Abdomen: Soft nontender nondistended with normal bowel sounds x4 quadrants. . Musculoskeletal: No lower extremity edema. 2/4 pulses in all 4 extremities. . Skin: No ulcerative lesions noted or rashes, skin tears noted . Psychiatry: Mood is appropriate for condition and setting    Data Reviewed: CBC: Recent Labs  Lab 04/11/18 2217 04/12/18 0229 04/13/18 0629  WBC 9.7 8.2 6.5  NEUTROABS  --   --  5.7  HGB 9.9* 8.8* 8.9*  HCT 30.9* 26.7* 27.5*  MCV 90.1 90.5 91.1  PLT 178 152 062   Basic Metabolic Panel: Recent Labs  Lab 04/11/18 2217 04/12/18 0229 04/13/18 0629 04/14/18 0619  NA 127* 130* 134* 136  K 3.7 3.7 2.9* 3.4*  CL 89* 94* 94* 94*  CO2 26 26 30 31   GLUCOSE 126* 108* 195* 121*  BUN 46* 38* 10 7*  CREATININE 2.66* 2.05* 0.72 0.62  CALCIUM 7.8* 7.6* 7.9* 8.5*  MG  --   --  1.5* 1.7   GFR: Estimated Creatinine Clearance: 57.5 mL/min (by C-G formula based on SCr of 0.62 mg/dL). Liver Function Tests: Recent Labs  Lab 04/11/18 2217 04/12/18 0229 04/13/18 0629  AST 59* 51* 42*  ALT 29 26 26   ALKPHOS 78 64 67  BILITOT 0.7 0.6  0.7  PROT 6.4* 5.7* 6.0*  ALBUMIN 3.3* 2.8* 2.8*   No results for input(s): LIPASE, AMYLASE in the last 168 hours. No results for input(s): AMMONIA in the last 168 hours. Coagulation Profile: No results for input(s): INR, PROTIME in the last 168 hours. Cardiac Enzymes: No results for input(s): CKTOTAL, CKMB, CKMBINDEX, TROPONINI in the last 168 hours. BNP (last 3 results) No results for input(s): PROBNP in the last 8760 hours. HbA1C: No results for input(s): HGBA1C in the last 72 hours. CBG: No results for input(s): GLUCAP in the last 168 hours. Lipid Profile: No results for input(s): CHOL, HDL, LDLCALC, TRIG, CHOLHDL, LDLDIRECT in the last 72 hours. Thyroid Function Tests: No results for input(s): TSH, T4TOTAL, FREET4, T3FREE, THYROIDAB in the  last 72 hours. Anemia Panel: No results for input(s): VITAMINB12, FOLATE, FERRITIN, TIBC, IRON, RETICCTPCT in the last 72 hours. Urine analysis:    Component Value Date/Time   COLORURINE YELLOW 04/11/2018 2311   APPEARANCEUR HAZY (A) 04/11/2018 2311   LABSPEC 1.013 04/11/2018 2311   PHURINE 5.0 04/11/2018 2311   GLUCOSEU NEGATIVE 04/11/2018 2311   HGBUR MODERATE (A) 04/11/2018 2311   BILIRUBINUR NEGATIVE 04/11/2018 2311   [place] 04/11/2018 2311   PROTEINUR 30 (A) 04/11/2018 2311   NITRITE NEGATIVE 04/11/2018 2311   LEUKOCYTESUR MODERATE (A) 04/11/2018 2311   Sepsis Labs: @LABRCNTIP (procalcitonin:4,lacticidven:4)  )No results found for this or any previous visit (from the past 240 hour(s)).    Studies: No results found.  Scheduled Meds: . citalopram  40 mg Oral Daily  . DULoxetine  30 mg Oral BH-q7a  . feeding supplement (PRO-STAT SUGAR FREE 64)  30 mL Oral TID WC  . ferrous sulfate  325 mg Oral Q breakfast  . gabapentin  400 mg Oral QID  . ipratropium-albuterol  3 mL Nebulization Q6H  . magnesium oxide  800 mg Oral BID  . metoprolol succinate  50 mg Oral Daily  . oseltamivir  30 mg Oral QHS  . oxybutynin  15  mg Oral QHS  . pantoprazole  40 mg Oral BID AC  . predniSONE  50 mg Oral Q breakfast    Continuous Infusions: . lactated ringers 100 mL/hr at 04/15/18 0500     LOS: 4 days     Cristal Deer, MD [facility]  To reach me or the doctor on call, go to: [email or web address] Password [email or web address]  04/15/2018, 10:00 AM

## 2018-04-15 NOTE — Progress Notes (Signed)
Noted that patient was still present in room despite discharge and called case manager, Josh to check on status of home oxygen as it had not been delivered to patient's room thus delaying discharge. Josh stated that due to incorrect orders and insurance denials late last night patient was unable to get oxygen delivered but he was working on getting oxygen delivered this morning so patient could be discharged promptly today.

## 2018-04-15 NOTE — Progress Notes (Signed)
Several attempts to call ride, Sissy at [phone] and was unable to reach her but voicemail have been left for her to return our call regarding picking patient up. Patient is unable to go by cab due to not being able to walk to the house without assistance.

## 2018-04-15 NOTE — Progress Notes (Signed)
Oxygen has been delivered and discharge instructions have been reviewed with patient who verbalizes understanding. IV has been removed. Patient ride has been called and they stated that they could not pick her up until around 4-5pm, patient states that she does not have anyone else that could pick her up. Will continue to monitor patient until ride arrives.

## 2018-05-15 DIAGNOSIS — M545 Low back pain: Secondary | ICD-10-CM | POA: Diagnosis not present

## 2018-05-15 DIAGNOSIS — R2681 Unsteadiness on feet: Secondary | ICD-10-CM | POA: Diagnosis not present

## 2018-05-15 DIAGNOSIS — C678 Malignant neoplasm of overlapping sites of bladder: Secondary | ICD-10-CM | POA: Diagnosis not present

## 2018-05-15 DIAGNOSIS — M797 Fibromyalgia: Secondary | ICD-10-CM | POA: Diagnosis not present

## 2018-05-15 DIAGNOSIS — M25519 Pain in unspecified shoulder: Secondary | ICD-10-CM | POA: Diagnosis not present

## 2018-05-15 DIAGNOSIS — Z79891 Long term (current) use of opiate analgesic: Secondary | ICD-10-CM | POA: Diagnosis not present

## 2018-06-12 DIAGNOSIS — M797 Fibromyalgia: Secondary | ICD-10-CM | POA: Diagnosis not present

## 2018-06-12 DIAGNOSIS — R2681 Unsteadiness on feet: Secondary | ICD-10-CM | POA: Diagnosis not present

## 2018-06-12 DIAGNOSIS — M25519 Pain in unspecified shoulder: Secondary | ICD-10-CM | POA: Diagnosis not present

## 2018-06-12 DIAGNOSIS — M545 Low back pain: Secondary | ICD-10-CM | POA: Diagnosis not present

## 2018-06-12 DIAGNOSIS — Z79891 Long term (current) use of opiate analgesic: Secondary | ICD-10-CM | POA: Diagnosis not present

## 2018-07-16 ENCOUNTER — Other Ambulatory Visit: Payer: Self-pay | Admitting: Gastroenterology

## 2018-07-17 ENCOUNTER — Telehealth: Payer: Self-pay | Admitting: Gastroenterology

## 2018-07-17 NOTE — Telephone Encounter (Signed)
Refilled Protonix at patients request. Only dispensed 1 months worth. Patient hasn't been seen in over 1 year. She likely needs to have an office visit for further refills.

## 2018-08-07 ENCOUNTER — Other Ambulatory Visit: Payer: Self-pay | Admitting: Gastroenterology

## 2018-08-07 DIAGNOSIS — R2681 Unsteadiness on feet: Secondary | ICD-10-CM | POA: Diagnosis not present

## 2018-08-07 DIAGNOSIS — Z79891 Long term (current) use of opiate analgesic: Secondary | ICD-10-CM | POA: Diagnosis not present

## 2018-08-07 DIAGNOSIS — M545 Low back pain: Secondary | ICD-10-CM | POA: Diagnosis not present

## 2018-08-07 DIAGNOSIS — M797 Fibromyalgia: Secondary | ICD-10-CM | POA: Diagnosis not present

## 2018-08-07 DIAGNOSIS — M25519 Pain in unspecified shoulder: Secondary | ICD-10-CM | POA: Diagnosis not present

## 2018-09-10 DIAGNOSIS — M797 Fibromyalgia: Secondary | ICD-10-CM | POA: Diagnosis not present

## 2018-09-10 DIAGNOSIS — M25519 Pain in unspecified shoulder: Secondary | ICD-10-CM | POA: Diagnosis not present

## 2018-09-10 DIAGNOSIS — M545 Low back pain: Secondary | ICD-10-CM | POA: Diagnosis not present

## 2018-09-10 DIAGNOSIS — R2681 Unsteadiness on feet: Secondary | ICD-10-CM | POA: Diagnosis not present

## 2018-10-02 DIAGNOSIS — Z79891 Long term (current) use of opiate analgesic: Secondary | ICD-10-CM | POA: Diagnosis not present

## 2018-10-02 DIAGNOSIS — M25519 Pain in unspecified shoulder: Secondary | ICD-10-CM | POA: Diagnosis not present

## 2018-10-02 DIAGNOSIS — M545 Low back pain: Secondary | ICD-10-CM | POA: Diagnosis not present

## 2018-10-03 ENCOUNTER — Ambulatory Visit: Payer: PPO | Admitting: Urology

## 2018-10-31 ENCOUNTER — Ambulatory Visit (INDEPENDENT_AMBULATORY_CARE_PROVIDER_SITE_OTHER): Payer: PPO | Admitting: Urology

## 2018-10-31 DIAGNOSIS — N3941 Urge incontinence: Secondary | ICD-10-CM

## 2018-10-31 DIAGNOSIS — R3915 Urgency of urination: Secondary | ICD-10-CM

## 2018-12-05 DIAGNOSIS — I10 Essential (primary) hypertension: Secondary | ICD-10-CM | POA: Diagnosis not present

## 2018-12-05 DIAGNOSIS — G629 Polyneuropathy, unspecified: Secondary | ICD-10-CM | POA: Diagnosis not present

## 2018-12-05 DIAGNOSIS — Z6822 Body mass index (BMI) 22.0-22.9, adult: Secondary | ICD-10-CM | POA: Diagnosis not present

## 2018-12-05 DIAGNOSIS — Z299 Encounter for prophylactic measures, unspecified: Secondary | ICD-10-CM | POA: Diagnosis not present

## 2018-12-05 DIAGNOSIS — Q059 Spina bifida, unspecified: Secondary | ICD-10-CM | POA: Diagnosis not present

## 2018-12-17 ENCOUNTER — Other Ambulatory Visit: Payer: Self-pay | Admitting: Gastroenterology

## 2018-12-18 DIAGNOSIS — Z79899 Other long term (current) drug therapy: Secondary | ICD-10-CM | POA: Diagnosis not present

## 2018-12-18 DIAGNOSIS — Z1339 Encounter for screening examination for other mental health and behavioral disorders: Secondary | ICD-10-CM | POA: Diagnosis not present

## 2018-12-18 DIAGNOSIS — Z299 Encounter for prophylactic measures, unspecified: Secondary | ICD-10-CM | POA: Diagnosis not present

## 2018-12-18 DIAGNOSIS — R5383 Other fatigue: Secondary | ICD-10-CM | POA: Diagnosis not present

## 2018-12-18 DIAGNOSIS — Z0000 Encounter for general adult medical examination without abnormal findings: Secondary | ICD-10-CM | POA: Diagnosis not present

## 2018-12-18 DIAGNOSIS — Z7189 Other specified counseling: Secondary | ICD-10-CM | POA: Diagnosis not present

## 2018-12-18 DIAGNOSIS — Z1331 Encounter for screening for depression: Secondary | ICD-10-CM | POA: Diagnosis not present

## 2018-12-18 DIAGNOSIS — I10 Essential (primary) hypertension: Secondary | ICD-10-CM | POA: Diagnosis not present

## 2018-12-18 DIAGNOSIS — F17210 Nicotine dependence, cigarettes, uncomplicated: Secondary | ICD-10-CM | POA: Diagnosis not present

## 2018-12-18 DIAGNOSIS — Z6822 Body mass index (BMI) 22.0-22.9, adult: Secondary | ICD-10-CM | POA: Diagnosis not present

## 2018-12-18 DIAGNOSIS — J45909 Unspecified asthma, uncomplicated: Secondary | ICD-10-CM | POA: Diagnosis not present

## 2018-12-25 DIAGNOSIS — M797 Fibromyalgia: Secondary | ICD-10-CM | POA: Diagnosis not present

## 2018-12-25 DIAGNOSIS — M25519 Pain in unspecified shoulder: Secondary | ICD-10-CM | POA: Diagnosis not present

## 2018-12-25 DIAGNOSIS — R2681 Unsteadiness on feet: Secondary | ICD-10-CM | POA: Diagnosis not present

## 2018-12-25 DIAGNOSIS — M545 Low back pain: Secondary | ICD-10-CM | POA: Diagnosis not present

## 2019-01-22 ENCOUNTER — Other Ambulatory Visit: Payer: Self-pay | Admitting: Gastroenterology

## 2019-01-30 DIAGNOSIS — Z299 Encounter for prophylactic measures, unspecified: Secondary | ICD-10-CM | POA: Diagnosis not present

## 2019-01-30 DIAGNOSIS — Z6822 Body mass index (BMI) 22.0-22.9, adult: Secondary | ICD-10-CM | POA: Diagnosis not present

## 2019-01-30 DIAGNOSIS — I10 Essential (primary) hypertension: Secondary | ICD-10-CM | POA: Diagnosis not present

## 2019-01-30 DIAGNOSIS — F419 Anxiety disorder, unspecified: Secondary | ICD-10-CM | POA: Diagnosis not present

## 2019-01-30 DIAGNOSIS — F322 Major depressive disorder, single episode, severe without psychotic features: Secondary | ICD-10-CM | POA: Diagnosis not present

## 2019-03-19 DIAGNOSIS — R2681 Unsteadiness on feet: Secondary | ICD-10-CM | POA: Diagnosis not present

## 2019-03-19 DIAGNOSIS — Z79891 Long term (current) use of opiate analgesic: Secondary | ICD-10-CM | POA: Diagnosis not present

## 2019-03-19 DIAGNOSIS — M797 Fibromyalgia: Secondary | ICD-10-CM | POA: Diagnosis not present

## 2019-03-19 DIAGNOSIS — M25519 Pain in unspecified shoulder: Secondary | ICD-10-CM | POA: Diagnosis not present

## 2019-03-19 DIAGNOSIS — M545 Low back pain: Secondary | ICD-10-CM | POA: Diagnosis not present

## 2019-04-07 DIAGNOSIS — G629 Polyneuropathy, unspecified: Secondary | ICD-10-CM | POA: Diagnosis not present

## 2019-04-07 DIAGNOSIS — I10 Essential (primary) hypertension: Secondary | ICD-10-CM | POA: Diagnosis not present

## 2019-04-10 DIAGNOSIS — S8002XA Contusion of left knee, initial encounter: Secondary | ICD-10-CM | POA: Diagnosis not present

## 2019-04-10 DIAGNOSIS — M25562 Pain in left knee: Secondary | ICD-10-CM | POA: Diagnosis not present

## 2019-04-10 DIAGNOSIS — Z79891 Long term (current) use of opiate analgesic: Secondary | ICD-10-CM | POA: Diagnosis not present

## 2019-04-10 DIAGNOSIS — G629 Polyneuropathy, unspecified: Secondary | ICD-10-CM | POA: Diagnosis not present

## 2019-04-10 DIAGNOSIS — W231XXA Caught, crushed, jammed, or pinched between stationary objects, initial encounter: Secondary | ICD-10-CM | POA: Diagnosis not present

## 2019-04-10 DIAGNOSIS — K219 Gastro-esophageal reflux disease without esophagitis: Secondary | ICD-10-CM | POA: Diagnosis not present

## 2019-04-10 DIAGNOSIS — Z885 Allergy status to narcotic agent status: Secondary | ICD-10-CM | POA: Diagnosis not present

## 2019-04-10 DIAGNOSIS — Z79899 Other long term (current) drug therapy: Secondary | ICD-10-CM | POA: Diagnosis not present

## 2019-04-10 DIAGNOSIS — J449 Chronic obstructive pulmonary disease, unspecified: Secondary | ICD-10-CM | POA: Diagnosis not present

## 2019-04-10 DIAGNOSIS — I10 Essential (primary) hypertension: Secondary | ICD-10-CM | POA: Diagnosis not present

## 2019-04-10 DIAGNOSIS — M797 Fibromyalgia: Secondary | ICD-10-CM | POA: Diagnosis not present

## 2019-05-02 DIAGNOSIS — R569 Unspecified convulsions: Secondary | ICD-10-CM | POA: Diagnosis not present

## 2019-05-02 DIAGNOSIS — I471 Supraventricular tachycardia: Secondary | ICD-10-CM | POA: Diagnosis not present

## 2019-05-02 DIAGNOSIS — F17200 Nicotine dependence, unspecified, uncomplicated: Secondary | ICD-10-CM | POA: Diagnosis not present

## 2019-05-02 DIAGNOSIS — Q760 Spina bifida occulta: Secondary | ICD-10-CM | POA: Diagnosis not present

## 2019-05-02 DIAGNOSIS — F3342 Major depressive disorder, recurrent, in full remission: Secondary | ICD-10-CM | POA: Diagnosis not present

## 2019-05-02 DIAGNOSIS — Z6823 Body mass index (BMI) 23.0-23.9, adult: Secondary | ICD-10-CM | POA: Diagnosis not present

## 2019-05-02 DIAGNOSIS — J449 Chronic obstructive pulmonary disease, unspecified: Secondary | ICD-10-CM | POA: Diagnosis not present

## 2019-05-02 DIAGNOSIS — I739 Peripheral vascular disease, unspecified: Secondary | ICD-10-CM | POA: Diagnosis not present

## 2019-05-07 ENCOUNTER — Telehealth: Payer: Self-pay | Admitting: Urology

## 2019-05-07 NOTE — Telephone Encounter (Signed)
Created in error

## 2019-05-23 ENCOUNTER — Other Ambulatory Visit: Payer: Self-pay | Admitting: Gastroenterology

## 2019-05-23 NOTE — Telephone Encounter (Signed)
Limited refills. Pt needs ov.

## 2019-05-24 ENCOUNTER — Encounter: Payer: Self-pay | Admitting: Gastroenterology

## 2019-05-24 NOTE — Telephone Encounter (Signed)
SENT LETTER STATING PATIENT NEEDED OFFICE VISIT

## 2019-06-10 ENCOUNTER — Ambulatory Visit: Payer: PPO | Admitting: Urology

## 2019-06-11 DIAGNOSIS — Z79891 Long term (current) use of opiate analgesic: Secondary | ICD-10-CM | POA: Diagnosis not present

## 2019-06-11 DIAGNOSIS — F419 Anxiety disorder, unspecified: Secondary | ICD-10-CM | POA: Diagnosis not present

## 2019-06-11 DIAGNOSIS — M545 Low back pain: Secondary | ICD-10-CM | POA: Diagnosis not present

## 2019-06-11 DIAGNOSIS — M25519 Pain in unspecified shoulder: Secondary | ICD-10-CM | POA: Diagnosis not present

## 2019-06-11 DIAGNOSIS — R2681 Unsteadiness on feet: Secondary | ICD-10-CM | POA: Diagnosis not present

## 2019-06-11 DIAGNOSIS — M797 Fibromyalgia: Secondary | ICD-10-CM | POA: Diagnosis not present

## 2019-07-08 DIAGNOSIS — F329 Major depressive disorder, single episode, unspecified: Secondary | ICD-10-CM | POA: Diagnosis not present

## 2019-07-08 DIAGNOSIS — I10 Essential (primary) hypertension: Secondary | ICD-10-CM | POA: Diagnosis not present

## 2019-07-12 DIAGNOSIS — F419 Anxiety disorder, unspecified: Secondary | ICD-10-CM | POA: Diagnosis not present

## 2019-07-12 DIAGNOSIS — M797 Fibromyalgia: Secondary | ICD-10-CM | POA: Diagnosis not present

## 2019-07-12 DIAGNOSIS — Z79891 Long term (current) use of opiate analgesic: Secondary | ICD-10-CM | POA: Diagnosis not present

## 2019-07-12 DIAGNOSIS — M25519 Pain in unspecified shoulder: Secondary | ICD-10-CM | POA: Diagnosis not present

## 2019-07-12 DIAGNOSIS — R2681 Unsteadiness on feet: Secondary | ICD-10-CM | POA: Diagnosis not present

## 2019-07-12 DIAGNOSIS — M545 Low back pain: Secondary | ICD-10-CM | POA: Diagnosis not present

## 2019-07-22 ENCOUNTER — Ambulatory Visit: Payer: PPO | Admitting: Urology

## 2019-08-01 DIAGNOSIS — E871 Hypo-osmolality and hyponatremia: Secondary | ICD-10-CM | POA: Diagnosis not present

## 2019-08-07 ENCOUNTER — Ambulatory Visit: Payer: PPO | Admitting: Urology

## 2019-08-07 DIAGNOSIS — F329 Major depressive disorder, single episode, unspecified: Secondary | ICD-10-CM | POA: Diagnosis not present

## 2019-08-07 DIAGNOSIS — I10 Essential (primary) hypertension: Secondary | ICD-10-CM | POA: Diagnosis not present

## 2019-08-19 ENCOUNTER — Other Ambulatory Visit: Payer: Self-pay

## 2019-08-19 ENCOUNTER — Ambulatory Visit (INDEPENDENT_AMBULATORY_CARE_PROVIDER_SITE_OTHER): Payer: PPO | Admitting: Urology

## 2019-08-19 ENCOUNTER — Encounter: Payer: Self-pay | Admitting: Urology

## 2019-08-19 VITALS — BP 134/75 | HR 65 | Temp 97.5°F | Ht 60.0 in | Wt 118.0 lb

## 2019-08-19 DIAGNOSIS — N3944 Nocturnal enuresis: Secondary | ICD-10-CM | POA: Insufficient documentation

## 2019-08-19 DIAGNOSIS — N3941 Urge incontinence: Secondary | ICD-10-CM | POA: Diagnosis not present

## 2019-08-19 DIAGNOSIS — R3915 Urgency of urination: Secondary | ICD-10-CM | POA: Insufficient documentation

## 2019-08-19 LAB — BLADDER SCAN AMB NON-IMAGING: Scan Result: 86.8

## 2019-08-19 NOTE — Progress Notes (Signed)
Urological Symptom Review  Patient is experiencing the following symptoms: Hard to postpone urination   Review of Systems  Gastrointestinal (upper)  : Negative for upper GI symptoms  Gastrointestinal (lower) : Negative for lower GI symptoms  Constitutional : Negative for symptoms  Skin: Negative for skin symptoms  Eyes: Negative for eye symptoms  Ear/Nose/Throat : Sinus problems  Hematologic/Lymphatic: Negative for Hematologic/Lymphatic symptoms  Cardiovascular : Negative for cardiovascular symptoms  Respiratory : Negative for respiratory symptoms  Endocrine: Negative for endocrine symptoms  Musculoskeletal: Back pain  Neurological: Negative for neurological symptoms  Psychologic: Anxiety
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
## 2019-08-19 NOTE — Patient Instructions (Signed)

## 2019-08-20 ENCOUNTER — Telehealth: Payer: Self-pay

## 2019-08-20 NOTE — Telephone Encounter (Signed)
[phone] please call patient about her medication refill

## 2019-08-20 NOTE — Telephone Encounter (Signed)
Pt called and would like a refill on a couple prescriptions. Pt is moving to [place], [place] on 7/30 and is having bladder surgery 09/2019. Pt plans to schedule a f/u after her move and surgery. Pt wants to stay with [facility] when she moves to [place]. Routing to [facility] as FYI and [facility] refill.    Pantoprazole 40 mg one capsule bid is the med pt wants refilled.

## 2019-08-21 ENCOUNTER — Telehealth: Payer: Self-pay | Admitting: Urology

## 2019-08-21 NOTE — Telephone Encounter (Signed)
Pt states she needs to speak with a nurse about changing her procedure appointment.

## 2019-08-21 NOTE — Telephone Encounter (Signed)
Surgery date rescheduled with pt for 09/26/2019- message sent to surgery scheduler, Hoyle Sauer

## 2019-08-22 ENCOUNTER — Other Ambulatory Visit: Payer: Self-pay | Admitting: Gastroenterology

## 2019-08-22 NOTE — Telephone Encounter (Signed)
FYI noted.

## 2019-08-22 NOTE — Telephone Encounter (Signed)
Pt called back and said she is out of medication and wants to know if we can try to refill as soon as possible.  Informed pt that I would let the provider know.

## 2019-08-23 NOTE — Telephone Encounter (Signed)
Per meds tab, was ordered yesterday. Please let me know if she hasn't received it for some reason and I can reorder.

## 2019-08-23 NOTE — Telephone Encounter (Signed)
Left a detailed message for pt. Pt was advised to call our office if she doesn't receive her medication.

## 2019-08-28 ENCOUNTER — Telehealth: Payer: Self-pay | Admitting: Urology

## 2019-08-28 NOTE — Telephone Encounter (Signed)
Pt called and asked for a nurse to call her about rescheduling her procedure.

## 2019-08-28 NOTE — Telephone Encounter (Signed)
Surgery date of August 16th given to pt. Message sent to [facility] to confirm with pt.

## 2019-09-06 ENCOUNTER — Telehealth: Payer: Self-pay | Admitting: Urology

## 2019-09-06 DIAGNOSIS — M797 Fibromyalgia: Secondary | ICD-10-CM | POA: Diagnosis not present

## 2019-09-06 DIAGNOSIS — M25519 Pain in unspecified shoulder: Secondary | ICD-10-CM | POA: Diagnosis not present

## 2019-09-06 DIAGNOSIS — F419 Anxiety disorder, unspecified: Secondary | ICD-10-CM | POA: Diagnosis not present

## 2019-09-06 DIAGNOSIS — G629 Polyneuropathy, unspecified: Secondary | ICD-10-CM | POA: Diagnosis not present

## 2019-09-06 DIAGNOSIS — M545 Low back pain: Secondary | ICD-10-CM | POA: Diagnosis not present

## 2019-09-06 DIAGNOSIS — R2681 Unsteadiness on feet: Secondary | ICD-10-CM | POA: Diagnosis not present

## 2019-09-06 DIAGNOSIS — F329 Major depressive disorder, single episode, unspecified: Secondary | ICD-10-CM | POA: Diagnosis not present

## 2019-09-06 DIAGNOSIS — Z79891 Long term (current) use of opiate analgesic: Secondary | ICD-10-CM | POA: Diagnosis not present

## 2019-09-06 DIAGNOSIS — I10 Essential (primary) hypertension: Secondary | ICD-10-CM | POA: Diagnosis not present

## 2019-09-06 DIAGNOSIS — K219 Gastro-esophageal reflux disease without esophagitis: Secondary | ICD-10-CM | POA: Diagnosis not present

## 2019-09-06 NOTE — Telephone Encounter (Signed)
Pt called and LM regarding Toviaz samples. She requested a call back.

## 2019-09-09 NOTE — Telephone Encounter (Signed)
Samples were left up front for pt to pick up.

## 2019-09-09 NOTE — Telephone Encounter (Signed)
Lft. Msg to return call.

## 2019-09-10 DIAGNOSIS — Z299 Encounter for prophylactic measures, unspecified: Secondary | ICD-10-CM | POA: Diagnosis not present

## 2019-09-10 DIAGNOSIS — Q059 Spina bifida, unspecified: Secondary | ICD-10-CM | POA: Diagnosis not present

## 2019-09-10 DIAGNOSIS — I10 Essential (primary) hypertension: Secondary | ICD-10-CM | POA: Diagnosis not present

## 2019-09-10 DIAGNOSIS — E871 Hypo-osmolality and hyponatremia: Secondary | ICD-10-CM | POA: Diagnosis not present

## 2019-09-10 DIAGNOSIS — F322 Major depressive disorder, single episode, severe without psychotic features: Secondary | ICD-10-CM | POA: Diagnosis not present

## 2019-09-10 DIAGNOSIS — G629 Polyneuropathy, unspecified: Secondary | ICD-10-CM | POA: Diagnosis not present

## 2019-09-17 NOTE — Patient Instructions (Signed)
Brooke Deleon  09/17/2019     @PREFPERIOPPHARMACY @   Your procedure is scheduled on  09/23/2019  Report to [name] at  [email or web address].  Call this number if you have problems the morning of surgery:  [phone]   Remember:  Do not eat or drink after midnight.                        Take these medicines the morning of surgery with A SIP OF WATER  Celexa, clonzepam(if needed), cymbalta, gabapentin, metoprolol, protonix, zanaflex(if needed), ultram. Use your inhaler before you come.    Do not wear jewelry, make-up or nail polish.  Do not wear lotions, powders, or perfumes. Please wear deodorant and brush your teeth.  Do not shave 48 hours prior to surgery.  Men may shave face and neck.  Do not bring valuables to the hospital.  [facility] is not responsible for any belongings or valuables.  Contacts, dentures or bridgework may not be worn into surgery.  Leave your suitcase in the car.  After surgery it may be brought to your room.  For patients admitted to the hospital, discharge time will be determined by your treatment team.  Patients discharged the day of surgery will not be allowed to drive home.   Name and phone number of your driver:   family Special instructions:  DO NOT smoke the morning of your procedure.  Please read over the following fact sheets that you were given. Anesthesia Post-op Instructions and Care and Recovery After Surgery       Botulinum Toxin Bladder Injection  A botulinum toxin bladder injection is a procedure to treat an overactive bladder. During the procedure, a drug called botulinum toxin is injected into the bladder through a long, thin needle. This drug relaxes the bladder muscles and reduces overactivity. You may need this procedure if your medicines are not working or you cannot take them. The procedure may be repeated as needed. The treatment usually lasts for 6 months. Your health care provider will monitor you to see how well  you respond. Tell a health care provider about:  Any allergies you have.  All medicines you are taking, including vitamins, herbs, eye drops, creams, and over-the-counter medicines.  Any problems you or family members have had with anesthetic medicines.  Any blood disorders you have.  Any surgeries you have had.  Any medical conditions you have.  Any previous reactions to a botulinum toxin injection.  Any symptoms of urinary tract infection. These include chills, fever, a burning feeling when passing urine, and needing to pass urine often.  Whether you are pregnant or may be pregnant. What are the risks? Generally this is a safe procedure. However, problems may occur, including:  Not being able to pass urine. If this happens, you may need to have your bladder emptied with a thin tube inserted into your urethra (urinary catheter).  Bleeding.  Urinary tract infection.  Allergic reaction to the botulinum toxin.  Pain or burning when passing urine.  Damage to other structures or organs. What happens before the procedure? Staying hydrated Follow instructions from your health care provider about hydration, which may include:  Up to 2 hours before the procedure - you may continue to drink clear liquids, such as water, clear fruit juice, black coffee, and plain tea. Eating and drinking restrictions Follow instructions from your health care provider about eating  and drinking, which may include:  8 hours before the procedure - stop eating heavy meals or foods, such as meat, fried foods, or fatty foods.  6 hours before the procedure - stop eating light meals or foods, such as toast or cereal.  6 hours before the procedure - stop drinking milk or drinks that contain milk.  2 hours before the procedure - stop drinking clear liquids. Medicines Ask your health care provider about:  Changing or stopping your regular medicines. This is especially important if you are taking  diabetes medicines or blood thinners.  Taking medicines such as aspirin and ibuprofen. These medicines can thin your blood. Do not take these medicines unless your health care provider tells you to take them.  Taking over-the-counter medicines, vitamins, herbs, and supplements. General instructions  Plan to have someone take you home from the hospital or clinic.  If you will be going home right after the procedure, plan to have someone with you for 24 hours.  Ask your health care provider what steps will be taken to help prevent infection. These may include: ? Removing hair at the procedure site. ? Washing skin with a germ-killing soap. ? Antibiotic medicine. What happens during the procedure?   You will be asked to empty your bladder.  An IV will be inserted into one of your veins.  You will be given one or more of the following: ? A medicine to help you relax (sedative). ? A medicine to numb the area (local anesthetic). ? A medicine to make you fall asleep (general anesthetic).  A long, thin scope called a cystoscope will be passed into your bladder through the part of the body that carries urine from your bladder (urethra).  The cystoscope will be used to fill your bladder with water.  A long needle will be passed through the cystoscope and into the bladder.  The botulinum toxin will be injected into your bladder. It may be injected into multiple areas of your bladder.  Your bladder will be emptied, and the cystoscope will be removed. The procedure may vary among health care providers and hospitals. What can I expect after procedure? After your procedure, it is common to have:  Blood-tinged urine.  Burning or soreness when you pass urine. Follow these instructions at home: Medicines  Take over-the-counter and prescription medicines only as told by your health care provider.  If you were prescribed an antibiotic medicine, take it as told by your health care provider.  Do not stop taking the antibiotic even if you start to feel better. General instructions   Do not drive for 24 hours if you were given a sedative during your procedure.  Drink enough fluid to keep your urine pale yellow.  Return to your normal activities as told by your health care provider. Ask your health care provider what activities are safe for you.  Keep all follow-up visits as told by your health care provider. This is important. Contact a health care provider if you have:  A fever or chills.  Blood-tinged urine for more than one day after your procedure.  Worsening pain or burning when you pass urine.  Pain or burning when passing urine for more than two days after your procedure.  Trouble emptying your bladder. Get help right away if you:  Have bright red blood in your urine.  Are unable to pass urine. Summary  A botulinum toxin bladder injection is a procedure to treat an overactive bladder.  This is generally  a very safe procedure. However, problems may occur, including not being able to pass urine, bleeding, infection, pain, and allergic reactions to medicines.  You will be told when to stop eating and drinking, and what medicines to change or stop. Follow instructions carefully.  After the procedure, it is common to have blood in urine and to have soreness or burning when passing urine.  Contact a health care provider if you have a fever, have blood in urine for more than a few days, or have trouble passing urine. Get help right away if you have bright red blood in the urine, or if you are unable to pass urine. This information is not intended to replace advice given to you by your health care provider. Make sure you discuss any questions you have with your health care provider. Document Revised: 08/04/2017 Document Reviewed: 08/04/2017 Elsevier Patient Education  2020 [employer].  Cystoscopy Cystoscopy is a procedure that is used to help diagnose and  sometimes treat conditions that affect the lower urinary tract. The lower urinary tract includes the bladder and the urethra. The urethra is the tube that drains urine from the bladder. Cystoscopy is done using a thin, tube-shaped instrument with a light and camera at the end (cystoscope). The cystoscope may be hard or flexible, depending on the goal of the procedure. The cystoscope is inserted through the urethra, into the bladder. Cystoscopy may be recommended if you have:  Urinary tract infections that keep coming back.  Blood in the urine (hematuria).  An inability to control when you urinate (urinary incontinence) or an overactive bladder.  Unusual cells found in a urine sample.  A blockage in the urethra, such as a urinary stone.  Painful urination.  An abnormality in the bladder found during an intravenous pyelogram (IVP) or CT scan. Cystoscopy may also be done to remove a sample of tissue to be examined under a microscope (biopsy). Tell a health care provider about:  Any allergies you have.  All medicines you are taking, including vitamins, herbs, eye drops, creams, and over-the-counter medicines.  Any problems you or family members have had with anesthetic medicines.  Any blood disorders you have.  Any surgeries you have had.  Any medical conditions you have.  Whether you are pregnant or may be pregnant. What are the risks? Generally, this is a safe procedure. However, problems may occur, including:  Infection.  Bleeding.  Allergic reactions to medicines.  Damage to other structures or organs. What happens before the procedure?  Ask your health care provider about: ? Changing or stopping your regular medicines. This is especially important if you are taking diabetes medicines or blood thinners. ? Taking medicines such as aspirin and ibuprofen. These medicines can thin your blood. Do not take these medicines unless your health care provider tells you to take  them. ? Taking over-the-counter medicines, vitamins, herbs, and supplements.  Follow instructions from your health care provider about eating or drinking restrictions.  Ask your health care provider what steps will be taken to help prevent infection. These may include: ? Washing skin with a germ-killing soap. ? Taking antibiotic medicine.  You may have an exam or testing, such as: ? X-rays of the bladder, urethra, or kidneys. ? Urine tests to check for signs of infection.  Plan to have someone take you home from the hospital or clinic. What happens during the procedure?   You will be given one or more of the following: ? A medicine to help you  relax (sedative). ? A medicine to numb the area (local anesthetic).  The area around the opening of your urethra will be cleaned.  The cystoscope will be passed through your urethra into your bladder.  Germ-free (sterile) fluid will flow through the cystoscope to fill your bladder. The fluid will stretch your bladder so that your health care provider can clearly examine your bladder walls.  Your doctor will look at the urethra and bladder. Your doctor may take a biopsy or remove stones.  The cystoscope will be removed, and your bladder will be emptied. The procedure may vary among health care providers and hospitals. What can I expect after the procedure? After the procedure, it is common to have:  Some soreness or pain in your abdomen and urethra.  Urinary symptoms. These include: ? Mild pain or burning when you urinate. Pain should stop within a few minutes after you urinate. This may last for up to 1 week. ? A small amount of blood in your urine for several days. ? Feeling like you need to urinate but producing only a small amount of urine. Follow these instructions at home: Medicines  Take over-the-counter and prescription medicines only as told by your health care provider.  If you were prescribed an antibiotic medicine, take  it as told by your health care provider. Do not stop taking the antibiotic even if you start to feel better. General instructions  Return to your normal activities as told by your health care provider. Ask your health care provider what activities are safe for you.  Do not drive for 24 hours if you were given a sedative during your procedure.  Watch for any blood in your urine. If the amount of blood in your urine increases, call your health care provider.  Follow instructions from your health care provider about eating or drinking restrictions.  If a tissue sample was removed for testing (biopsy) during your procedure, it is up to you to get your test results. Ask your health care provider, or the department that is doing the test, when your results will be ready.  Drink enough fluid to keep your urine pale yellow.  Keep all follow-up visits as told by your health care provider. This is important. Contact a health care provider if you:  Have pain that gets worse or does not get better with medicine, especially pain when you urinate.  Have trouble urinating.  Have more blood in your urine. Get help right away if you:  Have blood clots in your urine.  Have abdominal pain.  Have a fever or chills.  Are unable to urinate. Summary  Cystoscopy is a procedure that is used to help diagnose and sometimes treat conditions that affect the lower urinary tract.  Cystoscopy is done using a thin, tube-shaped instrument with a light and camera at the end.  After the procedure, it is common to have some soreness or pain in your abdomen and urethra.  Watch for any blood in your urine. If the amount of blood in your urine increases, call your health care provider.  If you were prescribed an antibiotic medicine, take it as told by your health care provider. Do not stop taking the antibiotic even if you start to feel better. This information is not intended to replace advice given to you by  your health care provider. Make sure you discuss any questions you have with your health care provider. Document Revised: 01/16/2018 Document Reviewed: 01/16/2018 Elsevier Patient Education  2020 Elsevier  Inc.  General Anesthesia, Adult, Care After This sheet gives you information about how to care for yourself after your procedure. Your health care provider may also give you more specific instructions. If you have problems or questions, contact your health care provider. What can I expect after the procedure? After the procedure, the following side effects are common:  Pain or discomfort at the IV site.  Nausea.  Vomiting.  Sore throat.  Trouble concentrating.  Feeling cold or chills.  Weak or tired.  Sleepiness and fatigue.  Soreness and body aches. These side effects can affect parts of the body that were not involved in surgery. Follow these instructions at home:  For at least 24 hours after the procedure:  Have a responsible adult stay with you. It is important to have someone help care for you until you are awake and alert.  Rest as needed.  Do not: ? Participate in activities in which you could fall or become injured. ? Drive. ? Use heavy machinery. ? Drink alcohol. ? Take sleeping pills or medicines that cause drowsiness. ? Make important decisions or sign legal documents. ? Take care of children on your own. Eating and drinking  Follow any instructions from your health care provider about eating or drinking restrictions.  When you feel hungry, start by eating small amounts of foods that are soft and easy to digest (bland), such as toast. Gradually return to your regular diet.  Drink enough fluid to keep your urine pale yellow.  If you vomit, rehydrate by drinking water, juice, or clear broth. General instructions  If you have sleep apnea, surgery and certain medicines can increase your risk for breathing problems. Follow instructions from your health  care provider about wearing your sleep device: ? Anytime you are sleeping, including during daytime naps. ? While taking prescription pain medicines, sleeping medicines, or medicines that make you drowsy.  Return to your normal activities as told by your health care provider. Ask your health care provider what activities are safe for you.  Take over-the-counter and prescription medicines only as told by your health care provider.  If you smoke, do not smoke without supervision.  Keep all follow-up visits as told by your health care provider. This is important. Contact a health care provider if:  You have nausea or vomiting that does not get better with medicine.  You cannot eat or drink without vomiting.  You have pain that does not get better with medicine.  You are unable to pass urine.  You develop a skin rash.  You have a fever.  You have redness around your IV site that gets worse. Get help right away if:  You have difficulty breathing.  You have chest pain.  You have blood in your urine or stool, or you vomit blood. Summary  After the procedure, it is common to have a sore throat or nausea. It is also common to feel tired.  Have a responsible adult stay with you for the first 24 hours after general anesthesia. It is important to have someone help care for you until you are awake and alert.  When you feel hungry, start by eating small amounts of foods that are soft and easy to digest (bland), such as toast. Gradually return to your regular diet.  Drink enough fluid to keep your urine pale yellow.  Return to your normal activities as told by your health care provider. Ask your health care provider what activities are safe for you. This information  is not intended to replace advice given to you by your health care provider. Make sure you discuss any questions you have with your health care provider. Document Revised: 01/27/2017 Document Reviewed: 09/09/2016 Elsevier  Patient Education  [place]. How to Use Chlorhexidine for Bathing Chlorhexidine gluconate (CHG) is a germ-killing (antiseptic) solution that is used to clean the skin. It can get rid of the bacteria that normally live on the skin and can keep them away for about 24 hours. To clean your skin with CHG, you may be given:  A CHG solution to use in the shower or as part of a sponge bath.  A prepackaged cloth that contains CHG. Cleaning your skin with CHG may help lower the risk for infection:  While you are staying in the intensive care unit of the hospital.  If you have a vascular access, such as a central line, to provide short-term or long-term access to your veins.  If you have a catheter to drain urine from your bladder.  If you are on a ventilator. A ventilator is a machine that helps you breathe by moving air in and out of your lungs.  After surgery. What are the risks? Risks of using CHG include:  A skin reaction.  Hearing loss, if CHG gets in your ears.  Eye injury, if CHG gets in your eyes and is not rinsed out.  The CHG product catching fire. Make sure that you avoid smoking and flames after applying CHG to your skin. Do not use CHG:  If you have a chlorhexidine allergy or have previously reacted to chlorhexidine.  On babies younger than 74 months of age. How to use CHG solution  Use CHG only as told by your health care provider, and follow the instructions on the label.  Use the full amount of CHG as directed. Usually, this is one bottle. During a shower Follow these steps when using CHG solution during a shower (unless your health care provider gives you different instructions): 1. Start the shower. 2. Use your normal soap and shampoo to wash your face and hair. 3. Turn off the shower or move out of the shower stream. 4. Pour the CHG onto a clean washcloth. Do not use any type of brush or rough-edged sponge. 5. Starting at your neck, lather your body down  to your toes. Make sure you follow these instructions: ? If you will be having surgery, pay special attention to the part of your body where you will be having surgery. Scrub this area for at least 1 minute. ? Do not use CHG on your head or face. If the solution gets into your ears or eyes, rinse them well with water. ? Avoid your genital area. ? Avoid any areas of skin that have broken skin, cuts, or scrapes. ? Scrub your back and under your arms. Make sure to wash skin folds. 6. Let the lather sit on your skin for 1-2 minutes or as long as told by your health care provider. 7. Thoroughly rinse your entire body in the shower. Make sure that all body creases and crevices are rinsed well. 8. Dry off with a clean towel. Do not put any substances on your body afterward--such as powder, lotion, or perfume--unless you are told to do so by your health care provider. Only use lotions that are recommended by the manufacturer. 9. Put on clean clothes or pajamas. 10. If it is the night before your surgery, sleep in clean sheets.  During  a sponge bath Follow these steps when using CHG solution during a sponge bath (unless your health care provider gives you different instructions): 1. Use your normal soap and shampoo to wash your face and hair. 2. Pour the CHG onto a clean washcloth. 3. Starting at your neck, lather your body down to your toes. Make sure you follow these instructions: ? If you will be having surgery, pay special attention to the part of your body where you will be having surgery. Scrub this area for at least 1 minute. ? Do not use CHG on your head or face. If the solution gets into your ears or eyes, rinse them well with water. ? Avoid your genital area. ? Avoid any areas of skin that have broken skin, cuts, or scrapes. ? Scrub your back and under your arms. Make sure to wash skin folds. 4. Let the lather sit on your skin for 1-2 minutes or as long as told by your health care  provider. 5. Using a different clean, wet washcloth, thoroughly rinse your entire body. Make sure that all body creases and crevices are rinsed well. 6. Dry off with a clean towel. Do not put any substances on your body afterward--such as powder, lotion, or perfume--unless you are told to do so by your health care provider. Only use lotions that are recommended by the manufacturer. 7. Put on clean clothes or pajamas. 8. If it is the night before your surgery, sleep in clean sheets. How to use CHG prepackaged cloths  Only use CHG cloths as told by your health care provider, and follow the instructions on the label.  Use the CHG cloth on clean, dry skin.  Do not use the CHG cloth on your head or face unless your health care provider tells you to.  When washing with the CHG cloth: ? Avoid your genital area. ? Avoid any areas of skin that have broken skin, cuts, or scrapes. Before surgery Follow these steps when using a CHG cloth to clean before surgery (unless your health care provider gives you different instructions): 1. Using the CHG cloth, vigorously scrub the part of your body where you will be having surgery. Scrub using a back-and-forth motion for 3 minutes. The area on your body should be completely wet with CHG when you are done scrubbing. 2. Do not rinse. Discard the cloth and let the area air-dry. Do not put any substances on the area afterward, such as powder, lotion, or perfume. 3. Put on clean clothes or pajamas. 4. If it is the night before your surgery, sleep in clean sheets.  For general bathing Follow these steps when using CHG cloths for general bathing (unless your health care provider gives you different instructions). 1. Use a separate CHG cloth for each area of your body. Make sure you wash between any folds of skin and between your fingers and toes. Wash your body in the following order, switching to a new cloth after each step: ? The front of your neck, shoulders, and  chest. ? Both of your arms, under your arms, and your hands. ? Your stomach and groin area, avoiding the genitals. ? Your right leg and foot. ? Your left leg and foot. ? The back of your neck, your back, and your buttocks. 2. Do not rinse. Discard the cloth and let the area air-dry. Do not put any substances on your body afterward--such as powder, lotion, or perfume--unless you are told to do so by your health care provider.  Only use lotions that are recommended by the manufacturer. 3. Put on clean clothes or pajamas. Contact a health care provider if:  Your skin gets irritated after scrubbing.  You have questions about using your solution or cloth. Get help right away if:  Your eyes become very red or swollen.  Your eyes itch badly.  Your skin itches badly and is red or swollen.  Your hearing changes.  You have trouble seeing.  You have swelling or tingling in your mouth or throat.  You have trouble breathing.  You swallow any chlorhexidine. Summary  Chlorhexidine gluconate (CHG) is a germ-killing (antiseptic) solution that is used to clean the skin. Cleaning your skin with CHG may help to lower your risk for infection.  You may be given CHG to use for bathing. It may be in a bottle or in a prepackaged cloth to use on your skin. Carefully follow your health care provider's instructions and the instructions on the product label.  Do not use CHG if you have a chlorhexidine allergy.  Contact your health care provider if your skin gets irritated after scrubbing. This information is not intended to replace advice given to you by your health care provider. Make sure you discuss any questions you have with your health care provider. Document Revised: 04/12/2018 Document Reviewed: 12/22/2016 Elsevier Patient Education  [employer].

## 2019-09-17 NOTE — Pre-Procedure Instructions (Signed)
Spoke with Brooke Deleon in pharmacy who states the have 100u of Botox in pharmacy already. She is aware of procedure date and states this should be fine.

## 2019-09-20 ENCOUNTER — Other Ambulatory Visit: Payer: Self-pay

## 2019-09-20 ENCOUNTER — Other Ambulatory Visit (HOSPITAL_COMMUNITY)
Admission: RE | Admit: 2019-09-20 | Discharge: 2019-09-20 | Disposition: A | Discharge status: Home / Self Care | Payer: PPO | Source: Ambulatory Visit | Attending: Urology | Admitting: Urology

## 2019-09-20 ENCOUNTER — Encounter (HOSPITAL_COMMUNITY)
Admission: RE | Admit: 2019-09-20 | Discharge: 2019-09-20 | Disposition: A | Discharge status: Home / Self Care | Payer: PPO | Source: Ambulatory Visit | Attending: Urology | Admitting: Urology

## 2019-09-20 DIAGNOSIS — Z20822 Contact with and (suspected) exposure to covid-19: Secondary | ICD-10-CM | POA: Diagnosis not present

## 2019-09-20 DIAGNOSIS — Z01818 Encounter for other preprocedural examination: Secondary | ICD-10-CM | POA: Diagnosis not present

## 2019-09-20 LAB — CBC WITH DIFFERENTIAL/PLATELET
Abs Immature Granulocytes: 0.03 10*3/uL (ref 0.00–0.07)
Basophils Absolute: 0.1 10*3/uL (ref 0.0–0.1)
Basophils Relative: 1 %
Eosinophils Absolute: 0.2 10*3/uL (ref 0.0–0.5)
Eosinophils Relative: 3 %
HCT: 42.2 % (ref 36.0–46.0)
Hemoglobin: 14 g/dL (ref 12.0–15.0)
Immature Granulocytes: 0 %
Lymphocytes Relative: 17 %
Lymphs Abs: 1.5 10*3/uL (ref 0.7–4.0)
MCH: 31.3 pg (ref 26.0–34.0)
MCHC: 33.2 g/dL (ref 30.0–36.0)
MCV: 94.2 fL (ref 80.0–100.0)
Monocytes Absolute: 1.2 10*3/uL — ABNORMAL HIGH (ref 0.1–1.0)
Monocytes Relative: 14 %
Neutro Abs: 5.5 10*3/uL (ref 1.7–7.7)
Neutrophils Relative %: 65 %
Platelets: 222 10*3/uL (ref 150–400)
RBC: 4.48 MIL/uL (ref 3.87–5.11)
RDW: 12.2 % (ref 11.5–15.5)
WBC: 8.4 10*3/uL (ref 4.0–10.5)
nRBC: 0 % (ref 0.0–0.2)

## 2019-09-20 LAB — BASIC METABOLIC PANEL
Anion gap: 9 (ref 5–15)
BUN: 8 mg/dL (ref 8–23)
CO2: 28 mmol/L (ref 22–32)
Calcium: 8.9 mg/dL (ref 8.9–10.3)
Chloride: 88 mmol/L — ABNORMAL LOW (ref 98–111)
Creatinine, Ser: 0.69 mg/dL (ref 0.44–1.00)
GFR calc Af Amer: >60 mL/min (ref >60)
GFR calc non Af Amer: >60 mL/min (ref >60)
Glucose, Bld: 49 mg/dL — ABNORMAL LOW (ref 70–99)
Potassium: 3.5 mmol/L (ref 3.5–5.1)
Sodium: 125 mmol/L — ABNORMAL LOW (ref 135–145)

## 2019-09-20 LAB — SARS CORONAVIRUS 2 (TAT 6-24 HRS): SARS Coronavirus 2: NEGATIVE

## 2019-09-20 NOTE — Progress Notes (Signed)
Abnormal sodium level 125 reported to Dr Alyson Ingles in office message system.  Dr Briant Cedar notified

## 2019-09-23 ENCOUNTER — Encounter (HOSPITAL_COMMUNITY): Payer: Self-pay | Admitting: Urology

## 2019-09-23 ENCOUNTER — Ambulatory Visit (HOSPITAL_COMMUNITY): Payer: PPO | Admitting: Anesthesiology

## 2019-09-23 ENCOUNTER — Encounter (HOSPITAL_COMMUNITY)
Admission: RE | Disposition: A | Discharge status: Home / Self Care | Payer: Self-pay | Source: Home / Self Care | Attending: Urology

## 2019-09-23 ENCOUNTER — Ambulatory Visit (HOSPITAL_COMMUNITY)
Admission: RE | Admit: 2019-09-23 | Discharge: 2019-09-23 | Disposition: A | Discharge status: Home / Self Care | Payer: PPO | Attending: Urology | Admitting: Urology

## 2019-09-23 DIAGNOSIS — Z8249 Family history of ischemic heart disease and other diseases of the circulatory system: Secondary | ICD-10-CM | POA: Insufficient documentation

## 2019-09-23 DIAGNOSIS — M1990 Unspecified osteoarthritis, unspecified site: Secondary | ICD-10-CM | POA: Diagnosis not present

## 2019-09-23 DIAGNOSIS — G629 Polyneuropathy, unspecified: Secondary | ICD-10-CM | POA: Diagnosis not present

## 2019-09-23 DIAGNOSIS — I10 Essential (primary) hypertension: Secondary | ICD-10-CM | POA: Insufficient documentation

## 2019-09-23 DIAGNOSIS — J449 Chronic obstructive pulmonary disease, unspecified: Secondary | ICD-10-CM | POA: Diagnosis not present

## 2019-09-23 DIAGNOSIS — N3941 Urge incontinence: Secondary | ICD-10-CM | POA: Insufficient documentation

## 2019-09-23 DIAGNOSIS — F17200 Nicotine dependence, unspecified, uncomplicated: Secondary | ICD-10-CM | POA: Insufficient documentation

## 2019-09-23 DIAGNOSIS — Z9081 Acquired absence of spleen: Secondary | ICD-10-CM | POA: Insufficient documentation

## 2019-09-23 DIAGNOSIS — R3915 Urgency of urination: Secondary | ICD-10-CM

## 2019-09-23 DIAGNOSIS — N3281 Overactive bladder: Secondary | ICD-10-CM | POA: Insufficient documentation

## 2019-09-23 DIAGNOSIS — Z888 Allergy status to other drugs, medicaments and biological substances status: Secondary | ICD-10-CM | POA: Diagnosis not present

## 2019-09-23 DIAGNOSIS — M797 Fibromyalgia: Secondary | ICD-10-CM | POA: Diagnosis not present

## 2019-09-23 DIAGNOSIS — Z885 Allergy status to narcotic agent status: Secondary | ICD-10-CM | POA: Insufficient documentation

## 2019-09-23 HISTORY — PX: CYSTOSCOPY WITH INJECTION: SHX1424

## 2019-09-23 HISTORY — PX: BOTOX INJECTION: SHX5754

## 2019-09-23 LAB — POCT I-STAT, CHEM 8
BUN: 6 mg/dL — ABNORMAL LOW (ref 8–23)
Calcium, Ion: 1.15 mmol/L (ref 1.15–1.40)
Chloride: 87 mmol/L — ABNORMAL LOW (ref 98–111)
Creatinine, Ser: 0.9 mg/dL (ref 0.44–1.00)
Glucose, Bld: 73 mg/dL (ref 70–99)
HCT: 42 % (ref 36.0–46.0)
Hemoglobin: 14.3 g/dL (ref 12.0–15.0)
Potassium: 4 mmol/L (ref 3.5–5.1)
Sodium: 126 mmol/L — ABNORMAL LOW (ref 135–145)
TCO2: 27 mmol/L (ref 22–32)

## 2019-09-23 SURGERY — CYSTOSCOPY, WITH INJECTION OF BLADDER NECK OR BLADDER WALL
Anesthesia: General

## 2019-09-23 MED ORDER — MIDAZOLAM HCL 2 MG/2ML IJ SOLN
INTRAMUSCULAR | Status: AC
  Filled 2019-09-23: qty 2

## 2019-09-23 MED ORDER — ONABOTULINUMTOXINA 100 UNITS IJ SOLR
100.0000 [IU] | Freq: Once | INTRAMUSCULAR | Status: DC
  Filled 2019-09-23: qty 100

## 2019-09-23 MED ORDER — WATER FOR IRRIGATION, STERILE IR SOLN
Status: DC | PRN
  Administered 2019-09-23: 500 mL

## 2019-09-23 MED ORDER — SODIUM CHLORIDE (PF) 0.9 % IJ SOLN
INTRAMUSCULAR | Status: AC
  Filled 2019-09-23: qty 20

## 2019-09-23 MED ORDER — ONDANSETRON HCL 4 MG/2ML IJ SOLN
INTRAMUSCULAR | Status: AC
  Filled 2019-09-23: qty 2

## 2019-09-23 MED ORDER — LIDOCAINE VISCOUS HCL 2 % MT SOLN
OROMUCOSAL | Status: AC
  Filled 2019-09-23: qty 15

## 2019-09-23 MED ORDER — ORAL CARE MOUTH RINSE: 15.0000 mL | Freq: Once | OROMUCOSAL | Status: AC

## 2019-09-23 MED ORDER — LIDOCAINE 2% (20 MG/ML) 5 ML SYRINGE
INTRAMUSCULAR | Status: AC
  Filled 2019-09-23: qty 5

## 2019-09-23 MED ORDER — FENTANYL CITRATE (PF) 100 MCG/2ML IJ SOLN
INTRAMUSCULAR | Status: DC | PRN
  Administered 2019-09-23: 50 ug via INTRAVENOUS

## 2019-09-23 MED ORDER — PROPOFOL 10 MG/ML IV BOLUS
INTRAVENOUS | Status: AC
  Filled 2019-09-23: qty 20

## 2019-09-23 MED ORDER — SODIUM CHLORIDE (PF) 0.9 % IJ SOLN
INTRAMUSCULAR | Status: DC | PRN
  Administered 2019-09-23: 20 mL via INTRAVENOUS

## 2019-09-23 MED ORDER — FENTANYL CITRATE (PF) 100 MCG/2ML IJ SOLN: 25.0000 ug | INTRAMUSCULAR | Status: DC | PRN

## 2019-09-23 MED ORDER — LACTATED RINGERS IV SOLN: INTRAVENOUS | Status: DC | PRN

## 2019-09-23 MED ORDER — ONABOTULINUMTOXINA 100 UNITS IJ SOLR
INTRAMUSCULAR | Status: DC | PRN
  Administered 2019-09-23: 100 [IU] via INTRAMUSCULAR

## 2019-09-23 MED ORDER — LACTATED RINGERS IV SOLN
Freq: Once | INTRAVENOUS | Status: AC
  Administered 2019-09-23: 1000 mL via INTRAVENOUS

## 2019-09-23 MED ORDER — CHLORHEXIDINE GLUCONATE 0.12 % MT SOLN
15.0000 mL | Freq: Once | OROMUCOSAL | Status: AC
  Administered 2019-09-23: 15 mL via OROMUCOSAL
  Filled 2019-09-23: qty 15

## 2019-09-23 MED ORDER — LIDOCAINE HCL (CARDIAC) PF 100 MG/5ML IV SOSY
PREFILLED_SYRINGE | INTRAVENOUS | Status: DC | PRN
  Administered 2019-09-23: 40 mg via INTRAVENOUS

## 2019-09-23 MED ORDER — MIDAZOLAM HCL 2 MG/2ML IJ SOLN
INTRAMUSCULAR | Status: DC | PRN
  Administered 2019-09-23: 1 mg via INTRAVENOUS

## 2019-09-23 MED ORDER — ONDANSETRON HCL 4 MG/2ML IJ SOLN
INTRAMUSCULAR | Status: DC | PRN
  Administered 2019-09-23: 4 mg via INTRAVENOUS

## 2019-09-23 MED ORDER — CEFAZOLIN SODIUM-DEXTROSE 2-4 GM/100ML-% IV SOLN
2.0000 g | INTRAVENOUS | Status: AC
  Administered 2019-09-23: 2 g via INTRAVENOUS
  Filled 2019-09-23: qty 100

## 2019-09-23 MED ORDER — PROMETHAZINE HCL 25 MG/ML IJ SOLN: 6.2500 mg | INTRAMUSCULAR | Status: DC | PRN

## 2019-09-23 MED ORDER — PROPOFOL 10 MG/ML IV BOLUS
INTRAVENOUS | Status: DC | PRN
  Administered 2019-09-23 (×9): 10 mg via INTRAVENOUS
  Administered 2019-09-23: 40 mg via INTRAVENOUS

## 2019-09-23 MED ORDER — MEPERIDINE HCL 50 MG/ML IJ SOLN: 6.2500 mg | INTRAMUSCULAR | Status: DC | PRN

## 2019-09-23 MED ORDER — FENTANYL CITRATE (PF) 100 MCG/2ML IJ SOLN
INTRAMUSCULAR | Status: AC
  Filled 2019-09-23: qty 2

## 2019-09-23 SURGICAL SUPPLY — 22 items
BAG DRAIN URO TABLE W/ADPT NS (BAG) ×3 IMPLANT
BAG DRN 8 ADPR NS SKTRN CSTL (BAG) ×1
BAG HAMPER (MISCELLANEOUS) ×3 IMPLANT
CLOTH BEACON ORANGE TIMEOUT ST (SAFETY) ×3 IMPLANT
GLOVE BIO SURGEON STRL SZ8 (GLOVE) ×3 IMPLANT
GLOVE BIOGEL PI IND STRL 7.0 (GLOVE) ×2 IMPLANT
GLOVE BIOGEL PI INDICATOR 7.0 (GLOVE) ×4
GOWN STRL REUS W/TWL LRG LVL3 (GOWN DISPOSABLE) ×3 IMPLANT
GOWN STRL REUS W/TWL XL LVL3 (GOWN DISPOSABLE) ×3 IMPLANT
KIT TURNOVER CYSTO (KITS) ×3 IMPLANT
MANIFOLD NEPTUNE II (INSTRUMENTS) ×3 IMPLANT
NDL ASPIRATION 22 (NEEDLE) ×1 IMPLANT
NDL HYPO 18GX1.5 BLUNT FILL (NEEDLE) ×1 IMPLANT
NEEDLE ASPIRATION 22 (NEEDLE) ×3 IMPLANT
NEEDLE HYPO 18GX1.5 BLUNT FILL (NEEDLE) ×3 IMPLANT
PACK CYSTO (CUSTOM PROCEDURE TRAY) ×3 IMPLANT
PAD ARMBOARD 7.5X6 YLW CONV (MISCELLANEOUS) ×3 IMPLANT
SYR 20ML LL LF (SYRINGE) ×3 IMPLANT
SYR CONTROL 10ML LL (SYRINGE) ×3 IMPLANT
TOWEL OR 17X26 4PK STRL BLUE (TOWEL DISPOSABLE) ×3 IMPLANT
WATER STERILE IRR 3000ML UROMA (IV SOLUTION) ×3 IMPLANT
WATER STERILE IRR 500ML POUR (IV SOLUTION) ×3 IMPLANT

## 2019-09-23 NOTE — Transfer of Care (Signed)
Immediate Anesthesia Transfer of Care Note  Patient: Brooke Deleon  Procedure(s) Performed: CYSTOSCOPY WITH INJECTION (N/A ) BOTOX INJECTION (N/A )  Patient Location: PACU  Anesthesia Type:MAC  Level of Consciousness: awake, drowsy and patient cooperative  Airway & Oxygen Therapy: Patient Spontanous Breathing and Patient connected to nasal cannula oxygen  Post-op Assessment: Report given to RN and Post -op Vital signs reviewed and stable  Post vital signs: Reviewed and stable  Last Vitals:  Vitals Value Taken Time  BP 124/74 09/23/19 0816  Temp    Pulse 57 09/23/19 0816  Resp 9 09/23/19 0816  SpO2 100 % 09/23/19 0816  Vitals shown include unvalidated device data.  Last Pain:  Vitals:   09/23/19 0710  TempSrc: Oral  PainSc: 6       Patients Stated Pain Goal: 6 (11/65/79 0383)  Complications: No complications documented.
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
## 2019-09-23 NOTE — Anesthesia Preprocedure Evaluation (Signed)
Anesthesia Evaluation  Patient identified by MRN, date of birth, ID band Patient awake    Reviewed: Allergy & Precautions, NPO status , Patient's Chart, lab work & pertinent test results, reviewed documented beta blocker date and time   History of Anesthesia Complications Negative for: history of anesthetic complications  Airway Mallampati: II  TM Distance: >3 FB Neck ROM: Full    Dental  (+) Edentulous Upper, Missing, Dental Advisory Given   Pulmonary asthma , COPD,  COPD inhaler, Current SmokerPatient did not abstain from smoking.,    Pulmonary exam normal breath sounds clear to auscultation       Cardiovascular Exercise Tolerance: Poor hypertension, Pt. on medications and Pt. on home beta blockers Normal cardiovascular exam Rhythm:Regular Rate:Normal     Neuro/Psych PSYCHIATRIC DISORDERS Anxiety Depression  Neuromuscular disease    GI/Hepatic PUD, GERD  Medicated and Controlled,  Endo/Other    Renal/GU      Musculoskeletal  (+) Arthritis , Fibromyalgia -  Abdominal   Peds  Hematology  (+) anemia ,   Anesthesia Other Findings   Reproductive/Obstetrics                            Anesthesia Physical Anesthesia Plan  ASA: III  Anesthesia Plan: General   Post-op Pain Management:    Induction: Intravenous  PONV Risk Score and Plan: Ondansetron and Midazolam  Airway Management Planned: Nasal Cannula, Natural Airway and Simple Face Mask  Additional Equipment:   Intra-op Plan:   Post-operative Plan:   Informed Consent: I have reviewed the patients History and Physical, chart, labs and discussed the procedure including the risks, benefits and alternatives for the proposed anesthesia with the patient or authorized representative who has indicated his/her understanding and acceptance.     Dental advisory given  Plan Discussed with: CRNA  Anesthesia Plan Comments:          Anesthesia Quick Evaluation

## 2019-09-23 NOTE — Discharge Instructions (Signed)
Botulinum Toxin Bladder Injection  A botulinum toxin bladder injection is a procedure to treat an overactive bladder. During the procedure, a drug called botulinum toxin is injected into the bladder through a long, thin needle. This drug relaxes the bladder muscles and reduces overactivity. You may need this procedure if your medicines are not working or you cannot take them. The procedure may be repeated as needed. The treatment usually lasts for 6 months. Your health care provider will monitor you to see how well you respond. Tell a health care provider about:  Any allergies you have.  All medicines you are taking, including vitamins, herbs, eye drops, creams, and over-the-counter medicines.  Any problems you or family members have had with anesthetic medicines.  Any blood disorders you have.  Any surgeries you have had.  Any medical conditions you have.  Any previous reactions to a botulinum toxin injection.  Any symptoms of urinary tract infection. These include chills, fever, a burning feeling when passing urine, and needing to pass urine often.  Whether you are pregnant or may be pregnant. What are the risks? Generally this is a safe procedure. However, problems may occur, including:  Not being able to pass urine. If this happens, you may need to have your bladder emptied with a thin tube inserted into your urethra (urinary catheter).  Bleeding.  Urinary tract infection.  Allergic reaction to the botulinum toxin.  Pain or burning when passing urine.  Damage to other structures or organs. What happens before the procedure? Staying hydrated Follow instructions from your health care provider about hydration, which may include:  Up to 2 hours before the procedure - you may continue to drink clear liquids, such as water, clear fruit juice, black coffee, and plain tea. Eating and drinking restrictions Follow instructions from your health care provider about eating and  drinking, which may include:  8 hours before the procedure - stop eating heavy meals or foods, such as meat, fried foods, or fatty foods.  6 hours before the procedure - stop eating light meals or foods, such as toast or cereal.  6 hours before the procedure - stop drinking milk or drinks that contain milk.  2 hours before the procedure - stop drinking clear liquids. Medicines Ask your health care provider about:  Changing or stopping your regular medicines. This is especially important if you are taking diabetes medicines or blood thinners.  Taking medicines such as aspirin and ibuprofen. These medicines can thin your blood. Do not take these medicines unless your health care provider tells you to take them.  Taking over-the-counter medicines, vitamins, herbs, and supplements. General instructions  Plan to have someone take you home from the hospital or clinic.  If you will be going home right after the procedure, plan to have someone with you for 24 hours.  Ask your health care provider what steps will be taken to help prevent infection. These may include: ? Removing hair at the procedure site. ? Washing skin with a germ-killing soap. ? Antibiotic medicine. What happens during the procedure?   You will be asked to empty your bladder.  An IV will be inserted into one of your veins.  You will be given one or more of the following: ? A medicine to help you relax (sedative). ? A medicine to numb the area (local anesthetic). ? A medicine to make you fall asleep (general anesthetic).  A long, thin scope called a cystoscope will be passed into your bladder through the part   of the body that carries urine from your bladder (urethra).  The cystoscope will be used to fill your bladder with water.  A long needle will be passed through the cystoscope and into the bladder.  The botulinum toxin will be injected into your bladder. It may be injected into multiple areas of your  bladder.  Your bladder will be emptied, and the cystoscope will be removed. The procedure may vary among health care providers and hospitals. What can I expect after procedure? After your procedure, it is common to have:  Blood-tinged urine.  Burning or soreness when you pass urine. Follow these instructions at home: Medicines  Take over-the-counter and prescription medicines only as told by your health care provider.  If you were prescribed an antibiotic medicine, take it as told by your health care provider. Do not stop taking the antibiotic even if you start to feel better. General instructions   Do not drive for 24 hours if you were given a sedative during your procedure.  Drink enough fluid to keep your urine pale yellow.  Return to your normal activities as told by your health care provider. Ask your health care provider what activities are safe for you.  Keep all follow-up visits as told by your health care provider. This is important. Contact a health care provider if you have:  A fever or chills.  Blood-tinged urine for more than one day after your procedure.  Worsening pain or burning when you pass urine.  Pain or burning when passing urine for more than two days after your procedure.  Trouble emptying your bladder. Get help right away if you:  Have bright red blood in your urine.  Are unable to pass urine. Summary  A botulinum toxin bladder injection is a procedure to treat an overactive bladder.  This is generally a very safe procedure. However, problems may occur, including not being able to pass urine, bleeding, infection, pain, and allergic reactions to medicines.  You will be told when to stop eating and drinking, and what medicines to change or stop. Follow instructions carefully.  After the procedure, it is common to have blood in urine and to have soreness or burning when passing urine.  Contact a health care provider if you have a fever, have  blood in urine for more than a few days, or have trouble passing urine. Get help right away if you have bright red blood in the urine, or if you are unable to pass urine. This information is not intended to replace advice given to you by your health care provider. Make sure you discuss any questions you have with your health care provider. Document Revised: 08/04/2017 Document Reviewed: 08/04/2017 Elsevier Patient Education  2020 [place] After These instructions provide you with information about caring for yourself after your procedure. Your health care provider may also give you more specific instructions. Your treatment has been planned according to current medical practices, but problems sometimes occur. Call your health care provider if you have any problems or questions after your procedure. What can I expect after the procedure? After your procedure, you may:  Feel sleepy for several hours.  Feel clumsy and have poor balance for several hours.  Feel forgetful about what happened after the procedure.  Have poor judgment for several hours.  Feel nauseous or vomit.  Have a sore throat if you had a breathing tube during the procedure. Follow these instructions at home:  For at least 24 hours after the procedure:      Have a responsible adult stay with you. It is important to have someone help care for you until you are awake and alert.  Rest as needed.  Do not: ? Participate in activities in which you could fall or become injured. ? Drive. ? Use heavy machinery. ? Drink alcohol. ? Take sleeping pills or medicines that cause drowsiness. ? Make important decisions or sign legal documents. ? Take care of children on your own. Eating and drinking  Follow the diet that is recommended by your health care provider.  If you vomit, drink water, juice, or soup when you can drink without vomiting.  Make sure you have little or no  nausea before eating solid foods. General instructions  Take over-the-counter and prescription medicines only as told by your health care provider.  If you have sleep apnea, surgery and certain medicines can increase your risk for breathing problems. Follow instructions from your health care provider about wearing your sleep device: ? Anytime you are sleeping, including during daytime naps. ? While taking prescription pain medicines, sleeping medicines, or medicines that make you drowsy.  If you smoke, do not smoke without supervision.  Keep all follow-up visits as told by your health care provider. This is important. Contact a health care provider if:  You keep feeling nauseous or you keep vomiting.  You feel light-headed.  You develop a rash.  You have a fever. Get help right away if:  You have trouble breathing. Summary  For several hours after your procedure, you may feel sleepy and have poor judgment.  Have a responsible adult stay with you for at least 24 hours or until you are awake and alert. This information is not intended to replace advice given to you by your health care provider. Make sure you discuss any questions you have with your health care provider. Document Revised: 04/24/2017 Document Reviewed: 05/17/2015 Elsevier Patient Education  [place].

## 2019-09-23 NOTE — Op Note (Signed)
Preoperative diagnosis: Overactive bladder  Postoperative diagnosis: same  Procedure: 1 cystoscopy 2. Intravesical botox injection 100* units  Attending: Nicolette Bang  Anesthesia: General  Estimated blood loss: Minimal  Drains: none  Specimens: none  Antibiotics: ancef  Findings:  Ureteral orifices in normal anatomic location.  No masses/lesions in the bladder. Vaginal mesh erosion at 1 oclock position mid urethra  Indications: Patient is a 64 year old female with a history of overactive bladder and urinary leakage refractory to anticholinergic therapy.  After discussing treatment options, they decided proceed with intravesical botox injection.  Procedure her in detail: The patient was brought to the operating room and a brief timeout was done to ensure correct patient, correct procedure, correct site.  General anesthesia was administered patient was placed in dorsal lithotomy position.  Their genitalia was then prepped and draped in usual sterile fashion.  A rigid 21 French cystoscope was passed in the urethra and the bladder.  Bladder was inspected and we noted no masses or lesions.  the ureteral orifices were in the normal orthotopic locations. Using a deflux injection needle we proceed to inject 100 units in a grid pattern between the ureteral orifices starting at the trigone to the mid posterior wall. We injected a total of 20 sites with 5 units per injection. The bladder was then drained and this concluded the procedure which was well tolerated by patient.  Complications: None  Condition: Stable, extubated, transferred to PACU  Plan: Patient is to be discharge home. They will followup in 1 month

## 2019-09-23 NOTE — Anesthesia Postprocedure Evaluation (Signed)
Anesthesia Post Note  Patient: Brooke Deleon  Procedure(s) Performed: CYSTOSCOPY WITH INJECTION (N/A ) BOTOX INJECTION (N/A )  Patient location during evaluation: PACU Anesthesia Type: MAC Level of consciousness: awake, awake and alert, oriented and patient cooperative Pain management: pain level controlled Vital Signs Assessment: post-procedure vital signs reviewed and stable Respiratory status: spontaneous breathing, nonlabored ventilation and respiratory function stable Cardiovascular status: blood pressure returned to baseline and stable Postop Assessment: no apparent nausea or vomiting Anesthetic complications: no   No complications documented.   Last Vitals:  Vitals:   09/23/19 0710 09/23/19 0815  BP: (!) 160/91 124/74  Pulse: 61 (!) 56  Resp: 18 (!) 9  Temp: 36.4 C   SpO2: 99% 100%    Last Pain:  Vitals:   09/23/19 0710  TempSrc: Oral  PainSc: 6                  Zyonna Vardaman L

## 2019-09-24 ENCOUNTER — Other Ambulatory Visit (HOSPITAL_COMMUNITY): Payer: PPO

## 2019-09-25 ENCOUNTER — Encounter (HOSPITAL_COMMUNITY): Payer: Self-pay | Admitting: Urology

## 2019-09-26 ENCOUNTER — Ambulatory Visit: Payer: PPO | Admitting: Urology

## 2019-10-03 ENCOUNTER — Encounter: Payer: Self-pay | Admitting: Urology

## 2019-10-03 ENCOUNTER — Ambulatory Visit (INDEPENDENT_AMBULATORY_CARE_PROVIDER_SITE_OTHER): Payer: PPO | Admitting: Urology

## 2019-10-03 ENCOUNTER — Other Ambulatory Visit: Payer: Self-pay

## 2019-10-03 VITALS — BP 136/80 | HR 67 | Temp 97.7°F | Ht 60.0 in | Wt 121.0 lb

## 2019-10-03 DIAGNOSIS — N3941 Urge incontinence: Secondary | ICD-10-CM

## 2019-10-03 LAB — URINALYSIS, ROUTINE W REFLEX MICROSCOPIC
Bilirubin, UA: NEGATIVE
Glucose, UA: NEGATIVE
Ketones, UA: NEGATIVE
Nitrite, UA: NEGATIVE
Protein,UA: NEGATIVE
RBC, UA: NEGATIVE
Specific Gravity, UA: 1.02 (ref 1.005–1.030)
Urobilinogen, Ur: 0.2 mg/dL (ref 0.2–1.0)
pH, UA: 7 (ref 5.0–7.5)

## 2019-10-03 LAB — MICROSCOPIC EXAMINATION
RBC, Urine: NONE SEEN /hpf (ref 0–2)
Renal Epithel, UA: NONE SEEN /hpf

## 2019-10-03 LAB — BLADDER SCAN AMB NON-IMAGING: Scan Result: 25.6

## 2019-10-03 MED ORDER — DESMOPRESSIN ACETATE 0.2 MG PO TABS: 0.2000 mg | ORAL_TABLET | Freq: Every day | ORAL | 11 refills | Status: DC
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
## 2019-10-03 NOTE — Patient Instructions (Signed)

## 2019-10-03 NOTE — Progress Notes (Signed)
Urological Symptom Review  Patient is experiencing the following symptoms: Trouble starting stream Weak stream   Review of Systems  Gastrointestinal (upper)  : Negative for upper GI symptoms  Gastrointestinal (lower) : Negative for lower GI symptoms  Constitutional : Fatigue  Skin: Negative for skin symptoms  Eyes: Negative for eye symptoms  Ear/Nose/Throat : Sinus problems  Hematologic/Lymphatic: Negative for Hematologic/Lymphatic symptoms  Cardiovascular : Negative for cardiovascular symptoms  Respiratory : Cough  Endocrine: Negative for endocrine symptoms  Musculoskeletal: Back pain Joint pain  Neurological: Negative for neurological symptoms  Psychologic: Anxiety

## 2019-10-09 ENCOUNTER — Emergency Department (HOSPITAL_COMMUNITY): Admission: EM | Admit: 2019-10-09 | Discharge: 2019-10-09 | Discharge status: Against Medical Advice | Payer: PPO

## 2019-10-09 ENCOUNTER — Other Ambulatory Visit: Payer: Self-pay

## 2019-10-09 NOTE — ED Notes (Signed)
Per registration pt left facilty

## 2019-10-15 DIAGNOSIS — M533 Sacrococcygeal disorders, not elsewhere classified: Secondary | ICD-10-CM | POA: Diagnosis not present

## 2019-10-15 DIAGNOSIS — M545 Low back pain, unspecified: Secondary | ICD-10-CM | POA: Diagnosis not present

## 2019-10-15 DIAGNOSIS — M546 Pain in thoracic spine: Secondary | ICD-10-CM | POA: Diagnosis not present

## 2019-10-15 DIAGNOSIS — R079 Chest pain, unspecified: Secondary | ICD-10-CM | POA: Diagnosis not present

## 2019-10-21 DIAGNOSIS — Z79891 Long term (current) use of opiate analgesic: Secondary | ICD-10-CM | POA: Diagnosis not present

## 2019-10-21 DIAGNOSIS — F419 Anxiety disorder, unspecified: Secondary | ICD-10-CM | POA: Diagnosis not present

## 2019-10-21 DIAGNOSIS — M797 Fibromyalgia: Secondary | ICD-10-CM | POA: Diagnosis not present

## 2019-10-21 DIAGNOSIS — M25519 Pain in unspecified shoulder: Secondary | ICD-10-CM | POA: Diagnosis not present

## 2019-10-21 DIAGNOSIS — M545 Low back pain: Secondary | ICD-10-CM | POA: Diagnosis not present

## 2019-10-21 DIAGNOSIS — R2681 Unsteadiness on feet: Secondary | ICD-10-CM | POA: Diagnosis not present

## 2019-11-07 DIAGNOSIS — F329 Major depressive disorder, single episode, unspecified: Secondary | ICD-10-CM | POA: Diagnosis not present

## 2019-11-07 DIAGNOSIS — I10 Essential (primary) hypertension: Secondary | ICD-10-CM | POA: Diagnosis not present

## 2019-11-19 DIAGNOSIS — Z6823 Body mass index (BMI) 23.0-23.9, adult: Secondary | ICD-10-CM | POA: Diagnosis not present

## 2019-11-19 DIAGNOSIS — Q059 Spina bifida, unspecified: Secondary | ICD-10-CM | POA: Diagnosis not present

## 2019-11-19 DIAGNOSIS — F17210 Nicotine dependence, cigarettes, uncomplicated: Secondary | ICD-10-CM | POA: Diagnosis not present

## 2019-11-19 DIAGNOSIS — J449 Chronic obstructive pulmonary disease, unspecified: Secondary | ICD-10-CM | POA: Diagnosis not present

## 2019-11-19 DIAGNOSIS — I10 Essential (primary) hypertension: Secondary | ICD-10-CM | POA: Diagnosis not present

## 2019-11-19 DIAGNOSIS — F322 Major depressive disorder, single episode, severe without psychotic features: Secondary | ICD-10-CM | POA: Diagnosis not present

## 2019-11-19 DIAGNOSIS — Z299 Encounter for prophylactic measures, unspecified: Secondary | ICD-10-CM | POA: Diagnosis not present

## 2019-12-06 DIAGNOSIS — I10 Essential (primary) hypertension: Secondary | ICD-10-CM | POA: Diagnosis not present

## 2019-12-06 DIAGNOSIS — F329 Major depressive disorder, single episode, unspecified: Secondary | ICD-10-CM | POA: Diagnosis not present

## 2019-12-16 DIAGNOSIS — Z79891 Long term (current) use of opiate analgesic: Secondary | ICD-10-CM | POA: Diagnosis not present

## 2019-12-16 DIAGNOSIS — F419 Anxiety disorder, unspecified: Secondary | ICD-10-CM | POA: Diagnosis not present

## 2019-12-16 DIAGNOSIS — M797 Fibromyalgia: Secondary | ICD-10-CM | POA: Diagnosis not present

## 2019-12-16 DIAGNOSIS — M25519 Pain in unspecified shoulder: Secondary | ICD-10-CM | POA: Diagnosis not present

## 2019-12-16 DIAGNOSIS — M545 Low back pain, unspecified: Secondary | ICD-10-CM | POA: Diagnosis not present

## 2019-12-16 DIAGNOSIS — R2681 Unsteadiness on feet: Secondary | ICD-10-CM | POA: Diagnosis not present

## 2019-12-24 ENCOUNTER — Ambulatory Visit: Payer: PPO | Admitting: Urology

## 2019-12-24 DIAGNOSIS — N3941 Urge incontinence: Secondary | ICD-10-CM

## 2020-01-22 ENCOUNTER — Other Ambulatory Visit: Payer: Self-pay | Admitting: Gastroenterology

## 2020-01-22 ENCOUNTER — Encounter: Payer: Self-pay | Admitting: Gastroenterology

## 2020-01-22 ENCOUNTER — Ambulatory Visit: Payer: PPO | Admitting: Urology

## 2020-01-22 NOTE — Telephone Encounter (Signed)
Brooke Deleon, please send note she needs office visit before any other refills.

## 2020-02-07 DIAGNOSIS — F329 Major depressive disorder, single episode, unspecified: Secondary | ICD-10-CM | POA: Diagnosis not present

## 2020-02-07 DIAGNOSIS — I10 Essential (primary) hypertension: Secondary | ICD-10-CM | POA: Diagnosis not present

## 2020-03-02 ENCOUNTER — Ambulatory Visit: Payer: PPO | Admitting: Urology

## 2020-03-09 DIAGNOSIS — F329 Major depressive disorder, single episode, unspecified: Secondary | ICD-10-CM | POA: Diagnosis not present

## 2020-03-09 DIAGNOSIS — R2681 Unsteadiness on feet: Secondary | ICD-10-CM | POA: Diagnosis not present

## 2020-03-09 DIAGNOSIS — I10 Essential (primary) hypertension: Secondary | ICD-10-CM | POA: Diagnosis not present

## 2020-03-09 DIAGNOSIS — M545 Low back pain, unspecified: Secondary | ICD-10-CM | POA: Diagnosis not present

## 2020-03-09 DIAGNOSIS — M797 Fibromyalgia: Secondary | ICD-10-CM | POA: Diagnosis not present

## 2020-03-09 DIAGNOSIS — M25519 Pain in unspecified shoulder: Secondary | ICD-10-CM | POA: Diagnosis not present

## 2020-03-09 DIAGNOSIS — Z79891 Long term (current) use of opiate analgesic: Secondary | ICD-10-CM | POA: Diagnosis not present

## 2020-03-09 DIAGNOSIS — F419 Anxiety disorder, unspecified: Secondary | ICD-10-CM | POA: Diagnosis not present

## 2020-03-13 ENCOUNTER — Ambulatory Visit: Payer: PPO | Admitting: Urology

## 2020-03-27 DIAGNOSIS — Z01419 Encounter for gynecological examination (general) (routine) without abnormal findings: Secondary | ICD-10-CM | POA: Diagnosis not present

## 2020-03-27 DIAGNOSIS — F17210 Nicotine dependence, cigarettes, uncomplicated: Secondary | ICD-10-CM | POA: Diagnosis not present

## 2020-03-27 DIAGNOSIS — Z6821 Body mass index (BMI) 21.0-21.9, adult: Secondary | ICD-10-CM | POA: Diagnosis not present

## 2020-03-27 DIAGNOSIS — Z7189 Other specified counseling: Secondary | ICD-10-CM | POA: Diagnosis not present

## 2020-03-27 DIAGNOSIS — I10 Essential (primary) hypertension: Secondary | ICD-10-CM | POA: Diagnosis not present

## 2020-03-27 DIAGNOSIS — Z299 Encounter for prophylactic measures, unspecified: Secondary | ICD-10-CM | POA: Diagnosis not present

## 2020-03-27 DIAGNOSIS — Z1339 Encounter for screening examination for other mental health and behavioral disorders: Secondary | ICD-10-CM | POA: Diagnosis not present

## 2020-03-27 DIAGNOSIS — Z79899 Other long term (current) drug therapy: Secondary | ICD-10-CM | POA: Diagnosis not present

## 2020-03-27 DIAGNOSIS — E7800 Pure hypercholesterolemia, unspecified: Secondary | ICD-10-CM | POA: Diagnosis not present

## 2020-03-27 DIAGNOSIS — Z1331 Encounter for screening for depression: Secondary | ICD-10-CM | POA: Diagnosis not present

## 2020-03-27 DIAGNOSIS — R5383 Other fatigue: Secondary | ICD-10-CM | POA: Diagnosis not present

## 2020-03-27 DIAGNOSIS — Z0000 Encounter for general adult medical examination without abnormal findings: Secondary | ICD-10-CM | POA: Diagnosis not present

## 2020-04-15 DIAGNOSIS — Z299 Encounter for prophylactic measures, unspecified: Secondary | ICD-10-CM | POA: Diagnosis not present

## 2020-04-15 DIAGNOSIS — I10 Essential (primary) hypertension: Secondary | ICD-10-CM | POA: Diagnosis not present

## 2020-04-15 DIAGNOSIS — J449 Chronic obstructive pulmonary disease, unspecified: Secondary | ICD-10-CM | POA: Diagnosis not present

## 2020-04-15 DIAGNOSIS — F17210 Nicotine dependence, cigarettes, uncomplicated: Secondary | ICD-10-CM | POA: Diagnosis not present

## 2020-04-15 DIAGNOSIS — F332 Major depressive disorder, recurrent severe without psychotic features: Secondary | ICD-10-CM | POA: Diagnosis not present

## 2020-04-28 ENCOUNTER — Ambulatory Visit: Payer: PPO | Admitting: Urology

## 2020-05-06 DIAGNOSIS — F329 Major depressive disorder, single episode, unspecified: Secondary | ICD-10-CM | POA: Diagnosis not present

## 2020-05-06 DIAGNOSIS — I10 Essential (primary) hypertension: Secondary | ICD-10-CM | POA: Diagnosis not present

## 2020-05-14 DIAGNOSIS — H40033 Anatomical narrow angle, bilateral: Secondary | ICD-10-CM | POA: Diagnosis not present

## 2020-05-14 DIAGNOSIS — H2513 Age-related nuclear cataract, bilateral: Secondary | ICD-10-CM | POA: Diagnosis not present

## 2020-06-01 DIAGNOSIS — M545 Low back pain, unspecified: Secondary | ICD-10-CM | POA: Diagnosis not present

## 2020-06-01 DIAGNOSIS — M797 Fibromyalgia: Secondary | ICD-10-CM | POA: Diagnosis not present

## 2020-06-01 DIAGNOSIS — M25519 Pain in unspecified shoulder: Secondary | ICD-10-CM | POA: Diagnosis not present

## 2020-06-01 DIAGNOSIS — F419 Anxiety disorder, unspecified: Secondary | ICD-10-CM | POA: Diagnosis not present

## 2020-06-01 DIAGNOSIS — R2681 Unsteadiness on feet: Secondary | ICD-10-CM | POA: Diagnosis not present

## 2020-06-01 DIAGNOSIS — Z79891 Long term (current) use of opiate analgesic: Secondary | ICD-10-CM | POA: Diagnosis not present

## 2020-07-13 ENCOUNTER — Other Ambulatory Visit: Payer: Self-pay | Admitting: Internal Medicine

## 2020-07-13 ENCOUNTER — Other Ambulatory Visit: Payer: Self-pay

## 2020-07-13 ENCOUNTER — Ambulatory Visit
Admission: RE | Admit: 2020-07-13 | Discharge: 2020-07-13 | Disposition: A | Discharge status: Home / Self Care | Payer: PPO | Source: Ambulatory Visit | Attending: Internal Medicine | Admitting: Internal Medicine

## 2020-07-13 DIAGNOSIS — Z1231 Encounter for screening mammogram for malignant neoplasm of breast: Secondary | ICD-10-CM

## 2020-08-06 DIAGNOSIS — I10 Essential (primary) hypertension: Secondary | ICD-10-CM | POA: Diagnosis not present

## 2020-08-06 DIAGNOSIS — I4891 Unspecified atrial fibrillation: Secondary | ICD-10-CM | POA: Diagnosis not present

## 2020-08-21 ENCOUNTER — Ambulatory Visit: Payer: PPO | Admitting: Urology

## 2020-08-21 ENCOUNTER — Telehealth: Payer: Self-pay

## 2020-08-21 ENCOUNTER — Other Ambulatory Visit: Payer: Self-pay

## 2020-08-21 DIAGNOSIS — N3941 Urge incontinence: Secondary | ICD-10-CM

## 2020-08-21 DIAGNOSIS — N3944 Nocturnal enuresis: Secondary | ICD-10-CM

## 2020-08-21 MED ORDER — DESMOPRESSIN ACETATE 0.2 MG PO TABS: 0.2000 mg | ORAL_TABLET | Freq: Every day | ORAL | 11 refills | Status: DC

## 2020-08-21 NOTE — Telephone Encounter (Signed)
Refilled submitted 

## 2020-08-21 NOTE — Telephone Encounter (Signed)
Patient will need refill on: desmopressin (DDAVP) 0.2 MG tablet Before next appt on 10-19-2020.  Uses: [place], [place] Phone:  [phone]  Fax:  [phone]      Thanks, Helene Kelp

## 2020-08-25 DIAGNOSIS — F419 Anxiety disorder, unspecified: Secondary | ICD-10-CM | POA: Diagnosis not present

## 2020-08-25 DIAGNOSIS — M25519 Pain in unspecified shoulder: Secondary | ICD-10-CM | POA: Diagnosis not present

## 2020-08-25 DIAGNOSIS — R2681 Unsteadiness on feet: Secondary | ICD-10-CM | POA: Diagnosis not present

## 2020-08-25 DIAGNOSIS — M797 Fibromyalgia: Secondary | ICD-10-CM | POA: Diagnosis not present

## 2020-08-25 DIAGNOSIS — M545 Low back pain, unspecified: Secondary | ICD-10-CM | POA: Diagnosis not present

## 2020-08-25 DIAGNOSIS — Z79891 Long term (current) use of opiate analgesic: Secondary | ICD-10-CM | POA: Diagnosis not present

## 2020-09-01 DIAGNOSIS — G8929 Other chronic pain: Secondary | ICD-10-CM | POA: Diagnosis not present

## 2020-09-01 DIAGNOSIS — Q760 Spina bifida occulta: Secondary | ICD-10-CM | POA: Diagnosis not present

## 2020-09-01 DIAGNOSIS — F3342 Major depressive disorder, recurrent, in full remission: Secondary | ICD-10-CM | POA: Diagnosis not present

## 2020-09-01 DIAGNOSIS — F1120 Opioid dependence, uncomplicated: Secondary | ICD-10-CM | POA: Diagnosis not present

## 2020-09-01 DIAGNOSIS — F17200 Nicotine dependence, unspecified, uncomplicated: Secondary | ICD-10-CM | POA: Diagnosis not present

## 2020-09-01 DIAGNOSIS — G629 Polyneuropathy, unspecified: Secondary | ICD-10-CM | POA: Diagnosis not present

## 2020-09-01 DIAGNOSIS — M545 Low back pain, unspecified: Secondary | ICD-10-CM | POA: Diagnosis not present

## 2020-09-01 DIAGNOSIS — J449 Chronic obstructive pulmonary disease, unspecified: Secondary | ICD-10-CM | POA: Diagnosis not present

## 2020-09-01 DIAGNOSIS — F1324 Sedative, hypnotic or anxiolytic dependence with sedative, hypnotic or anxiolytic-induced mood disorder: Secondary | ICD-10-CM | POA: Diagnosis not present

## 2020-09-01 DIAGNOSIS — I739 Peripheral vascular disease, unspecified: Secondary | ICD-10-CM | POA: Diagnosis not present

## 2020-09-01 DIAGNOSIS — I471 Supraventricular tachycardia: Secondary | ICD-10-CM | POA: Diagnosis not present

## 2020-09-22 DIAGNOSIS — F419 Anxiety disorder, unspecified: Secondary | ICD-10-CM | POA: Diagnosis not present

## 2020-09-22 DIAGNOSIS — M545 Low back pain, unspecified: Secondary | ICD-10-CM | POA: Diagnosis not present

## 2020-09-22 DIAGNOSIS — M797 Fibromyalgia: Secondary | ICD-10-CM | POA: Diagnosis not present

## 2020-09-22 DIAGNOSIS — Z79891 Long term (current) use of opiate analgesic: Secondary | ICD-10-CM | POA: Diagnosis not present

## 2020-09-22 DIAGNOSIS — M25519 Pain in unspecified shoulder: Secondary | ICD-10-CM | POA: Diagnosis not present

## 2020-09-22 DIAGNOSIS — R2681 Unsteadiness on feet: Secondary | ICD-10-CM | POA: Diagnosis not present

## 2020-10-07 DIAGNOSIS — I4891 Unspecified atrial fibrillation: Secondary | ICD-10-CM | POA: Diagnosis not present

## 2020-10-07 DIAGNOSIS — I10 Essential (primary) hypertension: Secondary | ICD-10-CM | POA: Diagnosis not present

## 2020-10-14 DIAGNOSIS — M545 Low back pain, unspecified: Secondary | ICD-10-CM | POA: Diagnosis not present

## 2020-10-14 DIAGNOSIS — M533 Sacrococcygeal disorders, not elsewhere classified: Secondary | ICD-10-CM | POA: Diagnosis not present

## 2020-10-14 DIAGNOSIS — M546 Pain in thoracic spine: Secondary | ICD-10-CM | POA: Diagnosis not present

## 2020-10-14 DIAGNOSIS — R079 Chest pain, unspecified: Secondary | ICD-10-CM | POA: Diagnosis not present

## 2020-10-19 ENCOUNTER — Ambulatory Visit: Payer: PPO | Admitting: Urology

## 2020-10-19 DIAGNOSIS — N3941 Urge incontinence: Secondary | ICD-10-CM

## 2020-10-19 DIAGNOSIS — N3944 Nocturnal enuresis: Secondary | ICD-10-CM

## 2020-11-04 DIAGNOSIS — Z79891 Long term (current) use of opiate analgesic: Secondary | ICD-10-CM | POA: Diagnosis not present

## 2020-11-04 DIAGNOSIS — F419 Anxiety disorder, unspecified: Secondary | ICD-10-CM | POA: Diagnosis not present

## 2020-11-04 DIAGNOSIS — M797 Fibromyalgia: Secondary | ICD-10-CM | POA: Diagnosis not present

## 2020-11-04 DIAGNOSIS — M545 Low back pain, unspecified: Secondary | ICD-10-CM | POA: Diagnosis not present

## 2020-11-04 DIAGNOSIS — M25519 Pain in unspecified shoulder: Secondary | ICD-10-CM | POA: Diagnosis not present

## 2020-11-04 DIAGNOSIS — R2681 Unsteadiness on feet: Secondary | ICD-10-CM | POA: Diagnosis not present

## 2020-11-06 DIAGNOSIS — K219 Gastro-esophageal reflux disease without esophagitis: Secondary | ICD-10-CM | POA: Diagnosis not present

## 2020-11-06 DIAGNOSIS — I10 Essential (primary) hypertension: Secondary | ICD-10-CM | POA: Diagnosis not present

## 2020-12-04 DIAGNOSIS — R2681 Unsteadiness on feet: Secondary | ICD-10-CM | POA: Diagnosis not present

## 2020-12-04 DIAGNOSIS — Z79891 Long term (current) use of opiate analgesic: Secondary | ICD-10-CM | POA: Diagnosis not present

## 2020-12-04 DIAGNOSIS — M545 Low back pain, unspecified: Secondary | ICD-10-CM | POA: Diagnosis not present

## 2020-12-04 DIAGNOSIS — M25519 Pain in unspecified shoulder: Secondary | ICD-10-CM | POA: Diagnosis not present

## 2020-12-04 DIAGNOSIS — M797 Fibromyalgia: Secondary | ICD-10-CM | POA: Diagnosis not present

## 2020-12-04 DIAGNOSIS — F419 Anxiety disorder, unspecified: Secondary | ICD-10-CM | POA: Diagnosis not present

## 2021-01-27 DIAGNOSIS — Z79891 Long term (current) use of opiate analgesic: Secondary | ICD-10-CM | POA: Diagnosis not present

## 2021-01-27 DIAGNOSIS — F419 Anxiety disorder, unspecified: Secondary | ICD-10-CM | POA: Diagnosis not present

## 2021-01-27 DIAGNOSIS — M545 Low back pain, unspecified: Secondary | ICD-10-CM | POA: Diagnosis not present

## 2021-01-27 DIAGNOSIS — M797 Fibromyalgia: Secondary | ICD-10-CM | POA: Diagnosis not present

## 2021-01-27 DIAGNOSIS — M25519 Pain in unspecified shoulder: Secondary | ICD-10-CM | POA: Diagnosis not present

## 2021-01-27 DIAGNOSIS — R2681 Unsteadiness on feet: Secondary | ICD-10-CM | POA: Diagnosis not present

## 2021-04-15 ENCOUNTER — Encounter: Payer: Self-pay | Admitting: *Deleted

## 2021-04-22 ENCOUNTER — Other Ambulatory Visit: Payer: Self-pay | Admitting: Internal Medicine

## 2021-05-19 ENCOUNTER — Encounter: Payer: Self-pay | Admitting: *Deleted

## 2021-05-26 DIAGNOSIS — M545 Low back pain, unspecified: Secondary | ICD-10-CM | POA: Diagnosis not present

## 2021-05-26 DIAGNOSIS — M25519 Pain in unspecified shoulder: Secondary | ICD-10-CM | POA: Diagnosis not present

## 2021-05-26 DIAGNOSIS — M797 Fibromyalgia: Secondary | ICD-10-CM | POA: Diagnosis not present

## 2021-05-26 DIAGNOSIS — R2681 Unsteadiness on feet: Secondary | ICD-10-CM | POA: Diagnosis not present

## 2021-05-26 DIAGNOSIS — Z79891 Long term (current) use of opiate analgesic: Secondary | ICD-10-CM | POA: Diagnosis not present

## 2021-07-07 DIAGNOSIS — I10 Essential (primary) hypertension: Secondary | ICD-10-CM | POA: Diagnosis not present

## 2021-07-07 DIAGNOSIS — I4891 Unspecified atrial fibrillation: Secondary | ICD-10-CM | POA: Diagnosis not present

## 2021-07-21 ENCOUNTER — Inpatient Hospital Stay: Admission: RE | Admit: 2021-07-21 | Payer: PPO | Source: Ambulatory Visit

## 2021-07-21 DIAGNOSIS — Z79891 Long term (current) use of opiate analgesic: Secondary | ICD-10-CM | POA: Diagnosis not present

## 2021-07-21 DIAGNOSIS — M25519 Pain in unspecified shoulder: Secondary | ICD-10-CM | POA: Diagnosis not present

## 2021-07-21 DIAGNOSIS — M545 Low back pain, unspecified: Secondary | ICD-10-CM | POA: Diagnosis not present

## 2021-07-21 DIAGNOSIS — R2681 Unsteadiness on feet: Secondary | ICD-10-CM | POA: Diagnosis not present

## 2021-07-22 ENCOUNTER — Ambulatory Visit: Payer: PPO

## 2021-08-24 ENCOUNTER — Other Ambulatory Visit: Payer: Self-pay | Admitting: Urology

## 2021-08-24 DIAGNOSIS — N3944 Nocturnal enuresis: Secondary | ICD-10-CM

## 2021-09-07 ENCOUNTER — Encounter: Payer: Self-pay | Admitting: Urology

## 2021-09-07 ENCOUNTER — Ambulatory Visit (INDEPENDENT_AMBULATORY_CARE_PROVIDER_SITE_OTHER): Payer: No Typology Code available for payment source | Admitting: Urology

## 2021-09-07 VITALS — BP 146/87 | HR 73

## 2021-09-07 DIAGNOSIS — N3944 Nocturnal enuresis: Secondary | ICD-10-CM

## 2021-09-07 DIAGNOSIS — N3941 Urge incontinence: Secondary | ICD-10-CM | POA: Diagnosis not present

## 2021-09-07 LAB — URINALYSIS, ROUTINE W REFLEX MICROSCOPIC
Bilirubin, UA: NEGATIVE
Glucose, UA: NEGATIVE
Nitrite, UA: POSITIVE — AB
Specific Gravity, UA: 1.015 (ref 1.005–1.030)
Urobilinogen, Ur: 1 mg/dL (ref 0.2–1.0)
pH, UA: 6.5 (ref 5.0–7.5)

## 2021-09-07 LAB — MICROSCOPIC EXAMINATION: Renal Epithel, UA: NONE SEEN /hpf

## 2021-09-07 LAB — BLADDER SCAN AMB NON-IMAGING: Scan Result: 0

## 2021-09-07 MED ORDER — DESMOPRESSIN ACETATE 0.2 MG PO TABS: 200.0000 ug | ORAL_TABLET | Freq: Every day | ORAL | 11 refills | Status: DC

## 2021-09-07 NOTE — Patient Instructions (Signed)

## 2021-09-07 NOTE — Progress Notes (Signed)
09/07/2021 11:32 AM   Brooke Deleon 02/03/1956 [id number]  Referring provider: Glenda Chroman, MD [place],  [place] [address]  Followup nocturnal enuresis and OAB   HPI: [name] is a 66yo here for followup for nocturnal enuresis and OAB. She continues to do well after intravesical botox. She has nocturnal enuresis and takes desmopressin 255mg qhs. She has rare nocturnal enuresis on the medication   PMH: Past Medical History:  Diagnosis Date   Allergy    Anemia    Anxiety    Arthritis    Asthma    COPD (chronic obstructive pulmonary disease) ([facility])    Depression    Fibromyalgia    Gastric ulcer    GERD (gastroesophageal reflux disease)    Hypertension    Neuropathy    Stress incontinence    Tachycardia     Surgical History: Past Surgical History:  Procedure Laterality Date   BIOPSY  03/01/2016   Procedure: BIOPSY;  Surgeon: SDanie Binder MD;  [facility];  Service: Endoscopy;;  gastric bx's   BOTOX INJECTION N/A 09/23/2019   Procedure: BOTOX INJECTION;  Surgeon: MCleon Gustin MD;  Location[facility];  Service: Urology;  Laterality: N/A;   COLONOSCOPY  2014   Dr. CDoristine Mango normal. next TCS 10 years   [facility] end [facility]   CYSTOSCOPY WITH INJECTION N/A 09/23/2019   Procedure: CYSTOSCOPY WITH INJECTION;  Surgeon: MCleon Gustin MD;  [facility];  Service: Urology;  Laterality: N/A;   ESOPHAGOGASTRODUODENOSCOPY (EGD) WITH PROPOFOL N/A 03/01/2016   Dr. fOneida Alar Upper GI bleed secondary to large gastric ulcer. Biopsies benign, no H pylori   ESOPHAGOGASTRODUODENOSCOPY (EGD) WITH PROPOFOL N/A 05/30/2016   Gastric ulcer completely healed, normal esophagus status post dilation, probable cervical esophageal web.   INCONTINENCE SURGERY     KNEE ARTHROSCOPY WITH MEDIAL MENISECTOMY Right 07/14/2016   Procedure: KNEE ARTHROSCOPY WITH  LATERAL MENISECTOMY;  Surgeon: HCarole Civil MD;   [facility];  Service: Orthopedics;  Laterality: Right;   MALONEY DILATION N/A 05/30/2016   Procedure: MVenia MinksDILATION;  Surgeon: RDaneil Dolin MD;  [facility];  Service: Endoscopy;  Laterality: N/A;   REVERSE SHOULDER ARTHROPLASTY Right 04/27/2017   REVERSE SHOULDER ARTHROPLASTY Right 04/27/2017   Procedure: RIGHT REVERSE TOTAL SHOULDER ARTHROPLASTY;  Surgeon: CTania Ade MD;  [facility]  Service: Orthopedics;  Laterality: Right;   SHOULDER SURGERY     SPLENECTOMY, PARTIAL     [facility] [place]   TUBAL LIGATION      Home Medications:  Allergies as of 09/07/2021       Reactions   Codeine Rash   Dilaudid [hydromorphone Hcl] Hives   Lyrica [pregabalin]    Hallucinations         Medication List        Accurate as of September 07, 2021 11:32 AM. If you have any questions, ask your nurse or doctor.          albuterol 108 (90 Base) MCG/ACT inhaler Commonly known as: VENTOLIN HFA Inhale 1-2 puffs into the lungs every 6 (six) hours as needed for wheezing or shortness of breath.   B-12 2500 MCG Tabs Take 2,500 mcg by mouth daily.   CALCIUM 600 + D PO Take 1 capsule by mouth daily.   citalopram 40 MG tablet Commonly known as:  CELEXA Take 40 mg by mouth daily.   clonazePAM 1 MG tablet Commonly known as: KLONOPIN Take 1 mg by mouth 2 (two) times daily.   clonazePAM 0.5 MG tablet Commonly known as: KLONOPIN SMARTSIG:1 Tablet(s) By Mouth 1 to 3 Times Daily PRN   desmopressin 0.2 MG tablet Commonly known as: DDAVP TAKE ONE TABLET AT BEDTIME   Dialyvite Vitamin D 5000 125 MCG (5000 UT) capsule Generic drug: Cholecalciferol Take 5,000 Units by mouth daily.   docusate sodium 100 MG capsule Commonly known as: COLACE Take 100 mg by mouth at bedtime.   DULoxetine 30 MG capsule Commonly known as: CYMBALTA Take 30 mg by mouth 2 (two) times daily.   ferrous sulfate 325 (65 FE) MG  tablet Take 325 mg by mouth daily with breakfast.   fluticasone 50 MCG/ACT nasal spray Commonly known as: FLONASE Place 1 spray into both nostrils daily as needed for allergies.   gabapentin 400 MG capsule Commonly known as: NEURONTIN Take 400 mg by mouth 2 (two) times daily.   GaviLAX 17 g packet Generic drug: polyethylene glycol Take 17 g by mouth daily as needed for moderate constipation.   lidocaine 5 % Commonly known as: LIDODERM Place 1 patch onto the skin daily as needed (pain).   LUBRICATING EYE DROPS OP Place 1 drop into both eyes daily.   Magnesium 250 MG Tabs Take 250 mg by mouth daily.   metoprolol succinate 100 MG 24 hr tablet Commonly known as: TOPROL-XL Take 50 mg by mouth 2 (two) times daily.   multivitamin with minerals Tabs tablet Take 1 tablet by mouth daily.   oxyCODONE-acetaminophen 10-325 MG tablet Commonly known as: PERCOCET Take 0.5 tablets by mouth 5 (five) times daily.   pantoprazole 40 MG tablet Commonly known as: PROTONIX TAKE (1) TABLET TWICE A DAY BEFORE MEALS.   Potassium 99 MG Tabs Take 99 mg by mouth daily.   sodium chloride 0.65 % Soln nasal spray Commonly known as: OCEAN Place 1 spray into both nostrils as needed (dryness).   tiZANidine 2 MG tablet Commonly known as: ZANAFLEX Take 2 mg by mouth 3 (three) times daily.   tiZANidine 4 MG tablet Commonly known as: ZANAFLEX Take 4 mg by mouth 2 (two) times daily as needed.   traMADol 50 MG tablet Commonly known as: ULTRAM Take 50 mg by mouth 3 (three) times daily as needed for moderate pain.   vitamin C 1000 MG tablet Take 1,000 mg by mouth daily.        Allergies:  Allergies  Allergen Reactions   Codeine Rash   Dilaudid [Hydromorphone Hcl] Hives   Lyrica [Pregabalin]     Hallucinations     Family History: Family History  Problem Relation Age of Onset   Alcohol abuse Mother    Asthma Mother    Heart disease Father    Alcohol abuse Father    Early death  Sister        mva   Early death Brother        suicide   Anesthesia problems Neg Hx    Hypotension Neg Hx    Malignant hyperthermia Neg Hx    Pseudochol deficiency Neg Hx    Colon cancer Neg Hx    Breast cancer Neg Hx     Social History:  reports that she has been smoking cigarettes. She started smoking about 53 years ago. She has a 40.00 pack-year smoking history. She has never used smokeless tobacco. She reports that she does  not drink alcohol and does not use drugs.  ROS: All other review of systems were reviewed and are negative except what is noted above in HPI  Physical Exam: BP (!) 146/87   Pulse 73   Constitutional:  Alert and oriented, No acute distress. HEENT: [place] AT, moist mucus membranes.  Trachea midline, no masses. Cardiovascular: No clubbing, cyanosis, or edema. Respiratory: Normal respiratory effort, no increased work of breathing. GI: Abdomen is soft, nontender, nondistended, no abdominal masses GU: No CVA tenderness.  Lymph: No cervical or inguinal lymphadenopathy. Skin: No rashes, bruises or suspicious lesions. Neurologic: Grossly intact, no focal deficits, moving all 4 extremities. Psychiatric: Normal mood and affect.  Laboratory Data: Lab Results  Component Value Date   WBC 8.4 09/20/2019   HGB 14.3 09/23/2019   HCT 42.0 09/23/2019   MCV 94.2 09/20/2019   PLT 222 09/20/2019    Lab Results  Component Value Date   CREATININE 0.90 09/23/2019    No results found for: "PSA"  No results found for: "TESTOSTERONE"  No results found for: "HGBA1C"  Urinalysis    Component Value Date/Time   COLORURINE YELLOW 04/11/2018 2311   APPEARANCEUR Clear 10/03/2019 0939   LABSPEC 1.013 04/11/2018 2311   PHURINE 5.0 04/11/2018 2311   GLUCOSEU Negative 10/03/2019 0939   HGBUR MODERATE (A) 04/11/2018 2311   BILIRUBINUR Negative 10/03/2019 0939   KETONESUR NEGATIVE 04/11/2018 2311   PROTEINUR Negative 10/03/2019 0939   PROTEINUR 30 (A) 04/11/2018 2311    NITRITE Negative 10/03/2019 0939   NITRITE NEGATIVE 04/11/2018 2311   LEUKOCYTESUR Trace (A) 10/03/2019 0939   LEUKOCYTESUR MODERATE (A) 04/11/2018 2311    Lab Results  Component Value Date   LABMICR See below: 10/03/2019   WBCUA 0-5 10/03/2019   LABEPIT 0-10 10/03/2019   BACTERIA Few (A) 10/03/2019    Pertinent Imaging:  No results found for this or any previous visit.  No results found for this or any previous visit.  No results found for this or any previous visit.  No results found for this or any previous visit.  No results found for this or any previous visit.  No results found for this or any previous visit.  No results found for this or any previous visit.  No results found for this or any previous visit.   Assessment & Plan:    1. Nocturnal enuresis -continue desmopressin 0.'2mg'$  qhs - BLADDER SCAN AMB NON-IMAGING - Urinalysis, Routine w reflex microscopic  2. Urge incontinence -improved after intravesical botox - BLADDER SCAN AMB NON-IMAGING - Urinalysis, Routine w reflex microscopic   No follow-ups on file.  Nicolette Bang, MD  [facility] [place]

## 2021-09-07 NOTE — Progress Notes (Signed)
post void residual=0 ?

## 2021-09-14 DIAGNOSIS — F419 Anxiety disorder, unspecified: Secondary | ICD-10-CM | POA: Diagnosis not present

## 2021-09-14 DIAGNOSIS — G8929 Other chronic pain: Secondary | ICD-10-CM | POA: Diagnosis not present

## 2021-09-14 DIAGNOSIS — Z7409 Other reduced mobility: Secondary | ICD-10-CM | POA: Diagnosis not present

## 2021-09-14 DIAGNOSIS — Z79899 Other long term (current) drug therapy: Secondary | ICD-10-CM | POA: Diagnosis not present

## 2021-09-14 DIAGNOSIS — S32512A Fracture of superior rim of left pubis, initial encounter for closed fracture: Secondary | ICD-10-CM | POA: Diagnosis not present

## 2021-09-14 DIAGNOSIS — J45909 Unspecified asthma, uncomplicated: Secondary | ICD-10-CM | POA: Diagnosis not present

## 2021-09-14 DIAGNOSIS — F32A Depression, unspecified: Secondary | ICD-10-CM | POA: Diagnosis not present

## 2021-09-14 DIAGNOSIS — W1830XA Fall on same level, unspecified, initial encounter: Secondary | ICD-10-CM | POA: Diagnosis not present

## 2021-09-14 DIAGNOSIS — Z885 Allergy status to narcotic agent status: Secondary | ICD-10-CM | POA: Diagnosis not present

## 2021-09-14 DIAGNOSIS — M25552 Pain in left hip: Secondary | ICD-10-CM | POA: Diagnosis not present

## 2021-09-14 DIAGNOSIS — F17210 Nicotine dependence, cigarettes, uncomplicated: Secondary | ICD-10-CM | POA: Diagnosis not present

## 2021-09-14 DIAGNOSIS — S32592A Other specified fracture of left pubis, initial encounter for closed fracture: Secondary | ICD-10-CM | POA: Diagnosis not present

## 2021-09-14 DIAGNOSIS — M797 Fibromyalgia: Secondary | ICD-10-CM | POA: Diagnosis not present

## 2021-09-14 DIAGNOSIS — R2689 Other abnormalities of gait and mobility: Secondary | ICD-10-CM | POA: Diagnosis not present

## 2021-09-14 DIAGNOSIS — I272 Pulmonary hypertension, unspecified: Secondary | ICD-10-CM | POA: Diagnosis not present

## 2021-09-14 DIAGNOSIS — R002 Palpitations: Secondary | ICD-10-CM | POA: Diagnosis not present

## 2021-09-14 DIAGNOSIS — R531 Weakness: Secondary | ICD-10-CM | POA: Diagnosis not present

## 2021-09-14 DIAGNOSIS — Z609 Problem related to social environment, unspecified: Secondary | ICD-10-CM | POA: Diagnosis not present

## 2021-09-14 DIAGNOSIS — Z9181 History of falling: Secondary | ICD-10-CM | POA: Diagnosis not present

## 2021-09-14 DIAGNOSIS — K219 Gastro-esophageal reflux disease without esophagitis: Secondary | ICD-10-CM | POA: Diagnosis not present

## 2021-09-14 DIAGNOSIS — S32119A Unspecified Zone I fracture of sacrum, initial encounter for closed fracture: Secondary | ICD-10-CM | POA: Diagnosis not present

## 2021-09-15 DIAGNOSIS — S32591D Other specified fracture of right pubis, subsequent encounter for fracture with routine healing: Secondary | ICD-10-CM | POA: Diagnosis not present

## 2021-09-15 DIAGNOSIS — I272 Pulmonary hypertension, unspecified: Secondary | ICD-10-CM | POA: Diagnosis not present

## 2021-09-15 DIAGNOSIS — S329XXA Fracture of unspecified parts of lumbosacral spine and pelvis, initial encounter for closed fracture: Secondary | ICD-10-CM | POA: Diagnosis not present

## 2021-09-15 DIAGNOSIS — W19XXXD Unspecified fall, subsequent encounter: Secondary | ICD-10-CM | POA: Diagnosis not present

## 2021-09-15 DIAGNOSIS — Z79899 Other long term (current) drug therapy: Secondary | ICD-10-CM | POA: Diagnosis not present

## 2021-09-15 DIAGNOSIS — M797 Fibromyalgia: Secondary | ICD-10-CM | POA: Diagnosis not present

## 2021-09-15 DIAGNOSIS — S32512A Fracture of superior rim of left pubis, initial encounter for closed fracture: Secondary | ICD-10-CM | POA: Diagnosis not present

## 2021-09-15 DIAGNOSIS — G8929 Other chronic pain: Secondary | ICD-10-CM | POA: Diagnosis not present

## 2021-09-15 DIAGNOSIS — I071 Rheumatic tricuspid insufficiency: Secondary | ICD-10-CM | POA: Diagnosis not present

## 2021-09-15 DIAGNOSIS — F419 Anxiety disorder, unspecified: Secondary | ICD-10-CM | POA: Diagnosis not present

## 2021-09-15 DIAGNOSIS — R002 Palpitations: Secondary | ICD-10-CM | POA: Diagnosis not present

## 2021-09-15 DIAGNOSIS — S32512D Fracture of superior rim of left pubis, subsequent encounter for fracture with routine healing: Secondary | ICD-10-CM | POA: Diagnosis not present

## 2021-09-16 DIAGNOSIS — M797 Fibromyalgia: Secondary | ICD-10-CM | POA: Diagnosis not present

## 2021-09-16 DIAGNOSIS — S32592D Other specified fracture of left pubis, subsequent encounter for fracture with routine healing: Secondary | ICD-10-CM | POA: Diagnosis not present

## 2021-09-16 DIAGNOSIS — F419 Anxiety disorder, unspecified: Secondary | ICD-10-CM | POA: Diagnosis not present

## 2021-09-16 DIAGNOSIS — Z79899 Other long term (current) drug therapy: Secondary | ICD-10-CM | POA: Diagnosis not present

## 2021-09-16 DIAGNOSIS — R002 Palpitations: Secondary | ICD-10-CM | POA: Diagnosis not present

## 2021-09-16 DIAGNOSIS — S32512D Fracture of superior rim of left pubis, subsequent encounter for fracture with routine healing: Secondary | ICD-10-CM | POA: Diagnosis not present

## 2021-09-16 DIAGNOSIS — W1830XD Fall on same level, unspecified, subsequent encounter: Secondary | ICD-10-CM | POA: Diagnosis not present

## 2021-09-16 DIAGNOSIS — G8929 Other chronic pain: Secondary | ICD-10-CM | POA: Diagnosis not present

## 2021-09-17 DIAGNOSIS — W19XXXD Unspecified fall, subsequent encounter: Secondary | ICD-10-CM | POA: Diagnosis not present

## 2021-09-17 DIAGNOSIS — S32502D Unspecified fracture of left pubis, subsequent encounter for fracture with routine healing: Secondary | ICD-10-CM | POA: Diagnosis not present

## 2021-09-17 DIAGNOSIS — M797 Fibromyalgia: Secondary | ICD-10-CM | POA: Diagnosis not present

## 2021-09-17 DIAGNOSIS — G609 Hereditary and idiopathic neuropathy, unspecified: Secondary | ICD-10-CM | POA: Diagnosis not present

## 2021-09-17 DIAGNOSIS — G8929 Other chronic pain: Secondary | ICD-10-CM | POA: Diagnosis not present

## 2021-09-17 DIAGNOSIS — F419 Anxiety disorder, unspecified: Secondary | ICD-10-CM | POA: Diagnosis not present

## 2021-09-20 DIAGNOSIS — I10 Essential (primary) hypertension: Secondary | ICD-10-CM | POA: Diagnosis not present

## 2021-09-20 DIAGNOSIS — R059 Cough, unspecified: Secondary | ICD-10-CM | POA: Diagnosis not present

## 2021-09-20 DIAGNOSIS — F17210 Nicotine dependence, cigarettes, uncomplicated: Secondary | ICD-10-CM | POA: Diagnosis not present

## 2021-09-20 DIAGNOSIS — Z299 Encounter for prophylactic measures, unspecified: Secondary | ICD-10-CM | POA: Diagnosis not present

## 2021-09-20 DIAGNOSIS — S329XXA Fracture of unspecified parts of lumbosacral spine and pelvis, initial encounter for closed fracture: Secondary | ICD-10-CM | POA: Diagnosis not present

## 2021-09-22 DIAGNOSIS — S32502D Unspecified fracture of left pubis, subsequent encounter for fracture with routine healing: Secondary | ICD-10-CM | POA: Diagnosis not present

## 2021-09-22 DIAGNOSIS — F419 Anxiety disorder, unspecified: Secondary | ICD-10-CM | POA: Diagnosis not present

## 2021-09-22 DIAGNOSIS — W19XXXD Unspecified fall, subsequent encounter: Secondary | ICD-10-CM | POA: Diagnosis not present

## 2021-09-22 DIAGNOSIS — M797 Fibromyalgia: Secondary | ICD-10-CM | POA: Diagnosis not present

## 2021-09-22 DIAGNOSIS — G8929 Other chronic pain: Secondary | ICD-10-CM | POA: Diagnosis not present

## 2021-09-22 DIAGNOSIS — G609 Hereditary and idiopathic neuropathy, unspecified: Secondary | ICD-10-CM | POA: Diagnosis not present

## 2021-10-10 DIAGNOSIS — F419 Anxiety disorder, unspecified: Secondary | ICD-10-CM | POA: Diagnosis not present

## 2021-10-10 DIAGNOSIS — G8929 Other chronic pain: Secondary | ICD-10-CM | POA: Diagnosis not present

## 2021-10-10 DIAGNOSIS — S32502D Unspecified fracture of left pubis, subsequent encounter for fracture with routine healing: Secondary | ICD-10-CM | POA: Diagnosis not present

## 2021-10-10 DIAGNOSIS — W19XXXD Unspecified fall, subsequent encounter: Secondary | ICD-10-CM | POA: Diagnosis not present

## 2021-10-10 DIAGNOSIS — M797 Fibromyalgia: Secondary | ICD-10-CM | POA: Diagnosis not present

## 2021-10-10 DIAGNOSIS — G609 Hereditary and idiopathic neuropathy, unspecified: Secondary | ICD-10-CM | POA: Diagnosis not present

## 2021-10-12 DIAGNOSIS — F3341 Major depressive disorder, recurrent, in partial remission: Secondary | ICD-10-CM | POA: Diagnosis not present

## 2021-10-12 DIAGNOSIS — N3944 Nocturnal enuresis: Secondary | ICD-10-CM | POA: Diagnosis not present

## 2021-10-12 DIAGNOSIS — N1831 Chronic kidney disease, stage 3a: Secondary | ICD-10-CM | POA: Diagnosis not present

## 2021-10-12 DIAGNOSIS — J449 Chronic obstructive pulmonary disease, unspecified: Secondary | ICD-10-CM | POA: Diagnosis not present

## 2021-10-12 DIAGNOSIS — I708 Atherosclerosis of other arteries: Secondary | ICD-10-CM | POA: Diagnosis not present

## 2021-10-12 DIAGNOSIS — R2681 Unsteadiness on feet: Secondary | ICD-10-CM | POA: Diagnosis not present

## 2021-10-12 DIAGNOSIS — G8929 Other chronic pain: Secondary | ICD-10-CM | POA: Diagnosis not present

## 2021-10-12 DIAGNOSIS — I272 Pulmonary hypertension, unspecified: Secondary | ICD-10-CM | POA: Diagnosis not present

## 2021-10-12 DIAGNOSIS — R000 Tachycardia, unspecified: Secondary | ICD-10-CM | POA: Diagnosis not present

## 2021-10-12 DIAGNOSIS — Z6823 Body mass index (BMI) 23.0-23.9, adult: Secondary | ICD-10-CM | POA: Diagnosis not present

## 2021-10-12 DIAGNOSIS — I129 Hypertensive chronic kidney disease with stage 1 through stage 4 chronic kidney disease, or unspecified chronic kidney disease: Secondary | ICD-10-CM | POA: Diagnosis not present

## 2021-10-12 DIAGNOSIS — Z008 Encounter for other general examination: Secondary | ICD-10-CM | POA: Diagnosis not present

## 2021-10-12 DIAGNOSIS — F419 Anxiety disorder, unspecified: Secondary | ICD-10-CM | POA: Diagnosis not present

## 2021-10-12 DIAGNOSIS — F17210 Nicotine dependence, cigarettes, uncomplicated: Secondary | ICD-10-CM | POA: Diagnosis not present

## 2021-10-12 DIAGNOSIS — D649 Anemia, unspecified: Secondary | ICD-10-CM | POA: Diagnosis not present

## 2021-10-12 DIAGNOSIS — Z79891 Long term (current) use of opiate analgesic: Secondary | ICD-10-CM | POA: Diagnosis not present

## 2021-10-12 DIAGNOSIS — Z79899 Other long term (current) drug therapy: Secondary | ICD-10-CM | POA: Diagnosis not present

## 2021-10-12 DIAGNOSIS — I700 Atherosclerosis of aorta: Secondary | ICD-10-CM | POA: Diagnosis not present

## 2021-10-18 DIAGNOSIS — G609 Hereditary and idiopathic neuropathy, unspecified: Secondary | ICD-10-CM | POA: Diagnosis not present

## 2021-10-18 DIAGNOSIS — W19XXXD Unspecified fall, subsequent encounter: Secondary | ICD-10-CM | POA: Diagnosis not present

## 2021-10-18 DIAGNOSIS — M797 Fibromyalgia: Secondary | ICD-10-CM | POA: Diagnosis not present

## 2021-10-18 DIAGNOSIS — S32502D Unspecified fracture of left pubis, subsequent encounter for fracture with routine healing: Secondary | ICD-10-CM | POA: Diagnosis not present

## 2021-10-20 ENCOUNTER — Ambulatory Visit
Admission: RE | Admit: 2021-10-20 | Discharge: 2021-10-20 | Disposition: A | Discharge status: Home / Self Care | Payer: No Typology Code available for payment source | Source: Ambulatory Visit | Attending: Internal Medicine | Admitting: Internal Medicine

## 2021-10-20 DIAGNOSIS — Z1231 Encounter for screening mammogram for malignant neoplasm of breast: Secondary | ICD-10-CM

## 2021-10-20 DIAGNOSIS — M25519 Pain in unspecified shoulder: Secondary | ICD-10-CM | POA: Diagnosis not present

## 2021-10-20 DIAGNOSIS — R2681 Unsteadiness on feet: Secondary | ICD-10-CM | POA: Diagnosis not present

## 2021-10-20 DIAGNOSIS — M545 Low back pain, unspecified: Secondary | ICD-10-CM | POA: Diagnosis not present

## 2021-10-20 DIAGNOSIS — Z79891 Long term (current) use of opiate analgesic: Secondary | ICD-10-CM | POA: Diagnosis not present

## 2021-10-21 DIAGNOSIS — G609 Hereditary and idiopathic neuropathy, unspecified: Secondary | ICD-10-CM | POA: Diagnosis not present

## 2021-10-21 DIAGNOSIS — F419 Anxiety disorder, unspecified: Secondary | ICD-10-CM | POA: Diagnosis not present

## 2021-10-21 DIAGNOSIS — S32502D Unspecified fracture of left pubis, subsequent encounter for fracture with routine healing: Secondary | ICD-10-CM | POA: Diagnosis not present

## 2021-10-21 DIAGNOSIS — M797 Fibromyalgia: Secondary | ICD-10-CM | POA: Diagnosis not present

## 2021-10-21 DIAGNOSIS — G8929 Other chronic pain: Secondary | ICD-10-CM | POA: Diagnosis not present

## 2021-10-21 DIAGNOSIS — W19XXXD Unspecified fall, subsequent encounter: Secondary | ICD-10-CM | POA: Diagnosis not present

## 2021-11-02 DIAGNOSIS — Q059 Spina bifida, unspecified: Secondary | ICD-10-CM | POA: Diagnosis not present

## 2021-11-02 DIAGNOSIS — I10 Essential (primary) hypertension: Secondary | ICD-10-CM | POA: Diagnosis not present

## 2021-11-02 DIAGNOSIS — Z299 Encounter for prophylactic measures, unspecified: Secondary | ICD-10-CM | POA: Diagnosis not present

## 2021-11-02 DIAGNOSIS — J449 Chronic obstructive pulmonary disease, unspecified: Secondary | ICD-10-CM | POA: Diagnosis not present

## 2021-11-02 DIAGNOSIS — M549 Dorsalgia, unspecified: Secondary | ICD-10-CM | POA: Diagnosis not present

## 2021-11-22 DIAGNOSIS — R2681 Unsteadiness on feet: Secondary | ICD-10-CM | POA: Diagnosis not present

## 2021-11-22 DIAGNOSIS — M545 Low back pain, unspecified: Secondary | ICD-10-CM | POA: Diagnosis not present

## 2021-11-22 DIAGNOSIS — M25519 Pain in unspecified shoulder: Secondary | ICD-10-CM | POA: Diagnosis not present

## 2021-11-22 DIAGNOSIS — Z79891 Long term (current) use of opiate analgesic: Secondary | ICD-10-CM | POA: Diagnosis not present

## 2021-11-25 DIAGNOSIS — M545 Low back pain, unspecified: Secondary | ICD-10-CM | POA: Diagnosis not present

## 2021-11-25 DIAGNOSIS — Z299 Encounter for prophylactic measures, unspecified: Secondary | ICD-10-CM | POA: Diagnosis not present

## 2021-11-25 DIAGNOSIS — M25552 Pain in left hip: Secondary | ICD-10-CM | POA: Diagnosis not present

## 2021-11-25 DIAGNOSIS — I10 Essential (primary) hypertension: Secondary | ICD-10-CM | POA: Diagnosis not present

## 2021-12-13 DIAGNOSIS — Z0100 Encounter for examination of eyes and vision without abnormal findings: Secondary | ICD-10-CM | POA: Diagnosis not present

## 2021-12-20 DIAGNOSIS — M25511 Pain in right shoulder: Secondary | ICD-10-CM | POA: Diagnosis not present

## 2021-12-23 DIAGNOSIS — R69 Illness, unspecified: Secondary | ICD-10-CM | POA: Diagnosis not present

## 2021-12-24 DIAGNOSIS — R69 Illness, unspecified: Secondary | ICD-10-CM | POA: Diagnosis not present

## 2022-01-19 ENCOUNTER — Encounter: Payer: Self-pay | Admitting: *Deleted

## 2022-01-25 DIAGNOSIS — M545 Low back pain, unspecified: Secondary | ICD-10-CM | POA: Diagnosis not present

## 2022-01-25 DIAGNOSIS — R69 Illness, unspecified: Secondary | ICD-10-CM | POA: Diagnosis not present

## 2022-01-25 DIAGNOSIS — Z79891 Long term (current) use of opiate analgesic: Secondary | ICD-10-CM | POA: Diagnosis not present

## 2022-01-25 DIAGNOSIS — M25519 Pain in unspecified shoulder: Secondary | ICD-10-CM | POA: Diagnosis not present

## 2022-01-25 DIAGNOSIS — R2681 Unsteadiness on feet: Secondary | ICD-10-CM | POA: Diagnosis not present

## 2022-01-26 DIAGNOSIS — R69 Illness, unspecified: Secondary | ICD-10-CM | POA: Diagnosis not present

## 2022-03-02 DIAGNOSIS — M542 Cervicalgia: Secondary | ICD-10-CM | POA: Diagnosis not present

## 2022-03-02 DIAGNOSIS — M545 Low back pain, unspecified: Secondary | ICD-10-CM | POA: Diagnosis not present

## 2022-03-02 DIAGNOSIS — Z79899 Other long term (current) drug therapy: Secondary | ICD-10-CM | POA: Diagnosis not present

## 2022-03-02 DIAGNOSIS — G8929 Other chronic pain: Secondary | ICD-10-CM | POA: Diagnosis not present

## 2022-03-23 DIAGNOSIS — M542 Cervicalgia: Secondary | ICD-10-CM | POA: Diagnosis not present

## 2022-03-23 DIAGNOSIS — M545 Low back pain, unspecified: Secondary | ICD-10-CM | POA: Diagnosis not present

## 2022-03-23 DIAGNOSIS — M7918 Myalgia, other site: Secondary | ICD-10-CM | POA: Diagnosis not present

## 2022-03-23 DIAGNOSIS — Z79899 Other long term (current) drug therapy: Secondary | ICD-10-CM | POA: Diagnosis not present

## 2022-03-23 DIAGNOSIS — G894 Chronic pain syndrome: Secondary | ICD-10-CM | POA: Diagnosis not present

## 2022-03-23 DIAGNOSIS — Z6823 Body mass index (BMI) 23.0-23.9, adult: Secondary | ICD-10-CM | POA: Diagnosis not present

## 2022-04-07 DIAGNOSIS — F17210 Nicotine dependence, cigarettes, uncomplicated: Secondary | ICD-10-CM | POA: Diagnosis not present

## 2022-04-07 DIAGNOSIS — J069 Acute upper respiratory infection, unspecified: Secondary | ICD-10-CM | POA: Diagnosis not present

## 2022-04-07 DIAGNOSIS — Q059 Spina bifida, unspecified: Secondary | ICD-10-CM | POA: Diagnosis not present

## 2022-04-07 DIAGNOSIS — J449 Chronic obstructive pulmonary disease, unspecified: Secondary | ICD-10-CM | POA: Diagnosis not present

## 2022-04-26 DIAGNOSIS — I10 Essential (primary) hypertension: Secondary | ICD-10-CM | POA: Diagnosis not present

## 2022-04-26 DIAGNOSIS — G629 Polyneuropathy, unspecified: Secondary | ICD-10-CM | POA: Diagnosis not present

## 2022-04-26 DIAGNOSIS — G8929 Other chronic pain: Secondary | ICD-10-CM | POA: Diagnosis not present

## 2022-04-26 DIAGNOSIS — R2689 Other abnormalities of gait and mobility: Secondary | ICD-10-CM | POA: Diagnosis not present

## 2022-06-07 DIAGNOSIS — Z0000 Encounter for general adult medical examination without abnormal findings: Secondary | ICD-10-CM | POA: Diagnosis not present

## 2022-06-07 DIAGNOSIS — I10 Essential (primary) hypertension: Secondary | ICD-10-CM | POA: Diagnosis not present

## 2022-06-07 DIAGNOSIS — E7800 Pure hypercholesterolemia, unspecified: Secondary | ICD-10-CM | POA: Diagnosis not present

## 2022-06-07 DIAGNOSIS — Z79899 Other long term (current) drug therapy: Secondary | ICD-10-CM | POA: Diagnosis not present

## 2022-06-07 DIAGNOSIS — Z1331 Encounter for screening for depression: Secondary | ICD-10-CM | POA: Diagnosis not present

## 2022-06-07 DIAGNOSIS — F17210 Nicotine dependence, cigarettes, uncomplicated: Secondary | ICD-10-CM | POA: Diagnosis not present

## 2022-06-07 DIAGNOSIS — R5383 Other fatigue: Secondary | ICD-10-CM | POA: Diagnosis not present

## 2022-06-07 DIAGNOSIS — Z299 Encounter for prophylactic measures, unspecified: Secondary | ICD-10-CM | POA: Diagnosis not present

## 2022-06-07 DIAGNOSIS — Z1339 Encounter for screening examination for other mental health and behavioral disorders: Secondary | ICD-10-CM | POA: Diagnosis not present

## 2022-06-07 DIAGNOSIS — Z7189 Other specified counseling: Secondary | ICD-10-CM | POA: Diagnosis not present

## 2022-09-13 ENCOUNTER — Ambulatory Visit: Payer: No Typology Code available for payment source | Admitting: Urology

## 2022-09-14 ENCOUNTER — Ambulatory Visit: Payer: No Typology Code available for payment source | Admitting: Urology

## 2022-09-16 ENCOUNTER — Other Ambulatory Visit: Payer: Self-pay | Admitting: Urology

## 2022-09-16 DIAGNOSIS — N3944 Nocturnal enuresis: Secondary | ICD-10-CM

## 2022-09-20 ENCOUNTER — Telehealth: Payer: Self-pay

## 2022-09-20 DIAGNOSIS — N3944 Nocturnal enuresis: Secondary | ICD-10-CM

## 2022-09-20 MED ORDER — DESMOPRESSIN ACETATE 0.2 MG PO TABS: 200.0000 ug | ORAL_TABLET | Freq: Every day | ORAL | 0 refills | Status: AC

## 2022-09-20 NOTE — Telephone Encounter (Signed)
[facility] calling to request a new prescription for  desmopressin (DDAVP) 0.2 MG tablet   The Rx has expired.  Patient is out of Rx.  Please advise as soon as possible.

## 2022-09-20 NOTE — Telephone Encounter (Signed)
Patient is made aware RX desmopressin  (DDAVP) sent to pharmacy, patient voiced understanding.    [facility] calling to request a new prescription for  desmopressin (DDAVP) 0.2 MG tablet    The Rx has expired.  Patient is out of Rx.   Please advise as soon as possible.

## 2022-10-14 ENCOUNTER — Ambulatory Visit (INDEPENDENT_AMBULATORY_CARE_PROVIDER_SITE_OTHER): Payer: Medicare PPO | Admitting: Urology

## 2022-10-14 ENCOUNTER — Encounter: Payer: Self-pay | Admitting: Urology

## 2022-10-14 VITALS — BP 143/79 | HR 72

## 2022-10-14 DIAGNOSIS — N3941 Urge incontinence: Secondary | ICD-10-CM

## 2022-10-14 DIAGNOSIS — N3944 Nocturnal enuresis: Secondary | ICD-10-CM | POA: Diagnosis not present

## 2022-10-14 LAB — URINALYSIS, ROUTINE W REFLEX MICROSCOPIC
Bilirubin, UA: NEGATIVE
Glucose, UA: NEGATIVE
Ketones, UA: NEGATIVE
Nitrite, UA: NEGATIVE
Protein,UA: NEGATIVE
RBC, UA: NEGATIVE
Specific Gravity, UA: 1.02 (ref 1.005–1.030)
Urobilinogen, Ur: 0.2 mg/dL (ref 0.2–1.0)
pH, UA: 6.5 (ref 5.0–7.5)

## 2022-10-14 LAB — MICROSCOPIC EXAMINATION
Bacteria, UA: NONE SEEN
RBC, Urine: NONE SEEN /HPF (ref 0–2)

## 2022-10-14 MED ORDER — DESMOPRESSIN ACETATE 0.2 MG PO TABS: 200.0000 ug | ORAL_TABLET | Freq: Every day | ORAL | 11 refills | Status: AC

## 2022-10-14 MED ORDER — POLYETHYLENE GLYCOL 3350 17 G PO PACK: 17.0000 g | PACK | Freq: Every day | ORAL | 5 refills | Status: AC | PRN
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
## 2022-10-14 NOTE — Patient Instructions (Signed)

## 2022-10-14 NOTE — Addendum Note (Signed)
Addended by: Malen Gauze on: 10/14/2022 12:36 PM   Modules accepted: Orders

## 2022-10-20 DIAGNOSIS — Z1211 Encounter for screening for malignant neoplasm of colon: Secondary | ICD-10-CM | POA: Diagnosis not present

## 2022-10-20 DIAGNOSIS — Z1212 Encounter for screening for malignant neoplasm of rectum: Secondary | ICD-10-CM | POA: Diagnosis not present

## 2022-11-04 ENCOUNTER — Other Ambulatory Visit: Payer: Self-pay | Admitting: Internal Medicine

## 2022-11-04 DIAGNOSIS — Z1231 Encounter for screening mammogram for malignant neoplasm of breast: Secondary | ICD-10-CM

## 2022-12-01 DIAGNOSIS — G8929 Other chronic pain: Secondary | ICD-10-CM | POA: Diagnosis not present

## 2022-12-01 DIAGNOSIS — M797 Fibromyalgia: Secondary | ICD-10-CM | POA: Diagnosis not present

## 2022-12-01 DIAGNOSIS — G629 Polyneuropathy, unspecified: Secondary | ICD-10-CM | POA: Diagnosis not present

## 2022-12-01 DIAGNOSIS — R2689 Other abnormalities of gait and mobility: Secondary | ICD-10-CM | POA: Diagnosis not present

## 2023-01-11 ENCOUNTER — Inpatient Hospital Stay: Admission: RE | Admit: 2023-01-11 | Payer: Medicare PPO | Source: Ambulatory Visit

## 2023-01-23 ENCOUNTER — Inpatient Hospital Stay: Admission: RE | Admit: 2023-01-23 | Payer: Medicare PPO | Source: Ambulatory Visit

## 2023-03-14 ENCOUNTER — Inpatient Hospital Stay: Admission: RE | Admit: 2023-03-14 | Payer: Medicare PPO | Source: Ambulatory Visit

## 2023-04-11 ENCOUNTER — Encounter

## 2023-04-11 ENCOUNTER — Inpatient Hospital Stay: Admission: RE | Admit: 2023-04-11 | Payer: Medicare HMO | Source: Ambulatory Visit

## 2023-05-09 ENCOUNTER — Encounter

## 2023-06-13 ENCOUNTER — Ambulatory Visit
Admission: RE | Admit: 2023-06-13 | Discharge: 2023-06-13 | Disposition: A | Discharge status: Home / Self Care | Source: Ambulatory Visit | Attending: Internal Medicine | Admitting: Internal Medicine

## 2023-06-13 DIAGNOSIS — Z1231 Encounter for screening mammogram for malignant neoplasm of breast: Secondary | ICD-10-CM | POA: Diagnosis not present

## 2023-07-25 DIAGNOSIS — J449 Chronic obstructive pulmonary disease, unspecified: Secondary | ICD-10-CM | POA: Diagnosis not present

## 2023-07-25 DIAGNOSIS — F17210 Nicotine dependence, cigarettes, uncomplicated: Secondary | ICD-10-CM | POA: Diagnosis not present

## 2023-07-25 DIAGNOSIS — Z299 Encounter for prophylactic measures, unspecified: Secondary | ICD-10-CM | POA: Diagnosis not present

## 2023-07-25 DIAGNOSIS — F419 Anxiety disorder, unspecified: Secondary | ICD-10-CM | POA: Diagnosis not present

## 2023-07-25 DIAGNOSIS — E7800 Pure hypercholesterolemia, unspecified: Secondary | ICD-10-CM | POA: Diagnosis not present

## 2023-07-25 DIAGNOSIS — R52 Pain, unspecified: Secondary | ICD-10-CM | POA: Diagnosis not present

## 2023-07-25 DIAGNOSIS — I10 Essential (primary) hypertension: Secondary | ICD-10-CM | POA: Diagnosis not present

## 2023-09-04 ENCOUNTER — Other Ambulatory Visit: Payer: Self-pay | Admitting: Urology

## 2023-10-12 DIAGNOSIS — R2689 Other abnormalities of gait and mobility: Secondary | ICD-10-CM | POA: Diagnosis not present

## 2023-10-12 DIAGNOSIS — G8929 Other chronic pain: Secondary | ICD-10-CM | POA: Diagnosis not present

## 2023-10-12 DIAGNOSIS — M797 Fibromyalgia: Secondary | ICD-10-CM | POA: Diagnosis not present

## 2023-10-12 DIAGNOSIS — M62838 Other muscle spasm: Secondary | ICD-10-CM | POA: Diagnosis not present

## 2023-10-12 DIAGNOSIS — I10 Essential (primary) hypertension: Secondary | ICD-10-CM | POA: Diagnosis not present

## 2023-10-13 ENCOUNTER — Ambulatory Visit: Payer: Medicare Other | Admitting: Urology

## 2023-10-13 DIAGNOSIS — I10 Essential (primary) hypertension: Secondary | ICD-10-CM | POA: Diagnosis not present

## 2023-10-13 DIAGNOSIS — Z299 Encounter for prophylactic measures, unspecified: Secondary | ICD-10-CM | POA: Diagnosis not present

## 2023-10-13 DIAGNOSIS — F17210 Nicotine dependence, cigarettes, uncomplicated: Secondary | ICD-10-CM | POA: Diagnosis not present

## 2023-11-20 DIAGNOSIS — R7989 Other specified abnormal findings of blood chemistry: Secondary | ICD-10-CM | POA: Diagnosis not present

## 2023-11-20 DIAGNOSIS — R7402 Elevation of levels of lactic acid dehydrogenase (LDH): Secondary | ICD-10-CM | POA: Diagnosis not present

## 2023-11-20 DIAGNOSIS — M6282 Rhabdomyolysis: Secondary | ICD-10-CM | POA: Diagnosis not present

## 2023-11-20 DIAGNOSIS — D72829 Elevated white blood cell count, unspecified: Secondary | ICD-10-CM | POA: Diagnosis not present

## 2023-11-24 DIAGNOSIS — A419 Sepsis, unspecified organism: Secondary | ICD-10-CM | POA: Diagnosis not present

## 2023-11-24 DIAGNOSIS — S22069A Unspecified fracture of T7-T8 vertebra, initial encounter for closed fracture: Secondary | ICD-10-CM | POA: Diagnosis not present

## 2023-11-24 DIAGNOSIS — I10 Essential (primary) hypertension: Secondary | ICD-10-CM | POA: Diagnosis not present

## 2023-11-24 DIAGNOSIS — I214 Non-ST elevation (NSTEMI) myocardial infarction: Secondary | ICD-10-CM | POA: Diagnosis not present

## 2023-11-24 DIAGNOSIS — N390 Urinary tract infection, site not specified: Secondary | ICD-10-CM | POA: Diagnosis not present

## 2023-11-24 DIAGNOSIS — T796XXA Traumatic ischemia of muscle, initial encounter: Secondary | ICD-10-CM | POA: Diagnosis not present

## 2023-11-24 DIAGNOSIS — X58XXXA Exposure to other specified factors, initial encounter: Secondary | ICD-10-CM | POA: Diagnosis not present

## 2023-11-24 DIAGNOSIS — S32029A Unspecified fracture of second lumbar vertebra, initial encounter for closed fracture: Secondary | ICD-10-CM | POA: Diagnosis not present

## 2023-11-24 DIAGNOSIS — R6521 Severe sepsis with septic shock: Secondary | ICD-10-CM | POA: Diagnosis not present

## 2023-11-24 DIAGNOSIS — M8580 Other specified disorders of bone density and structure, unspecified site: Secondary | ICD-10-CM | POA: Diagnosis not present

## 2023-11-24 DIAGNOSIS — E039 Hypothyroidism, unspecified: Secondary | ICD-10-CM | POA: Diagnosis not present

## 2023-11-25 DIAGNOSIS — I10 Essential (primary) hypertension: Secondary | ICD-10-CM | POA: Diagnosis not present

## 2023-11-25 DIAGNOSIS — T796XXA Traumatic ischemia of muscle, initial encounter: Secondary | ICD-10-CM | POA: Diagnosis not present

## 2023-11-25 DIAGNOSIS — E46 Unspecified protein-calorie malnutrition: Secondary | ICD-10-CM | POA: Diagnosis not present

## 2023-11-25 DIAGNOSIS — E878 Other disorders of electrolyte and fluid balance, not elsewhere classified: Secondary | ICD-10-CM | POA: Diagnosis not present

## 2023-11-25 DIAGNOSIS — M8580 Other specified disorders of bone density and structure, unspecified site: Secondary | ICD-10-CM | POA: Diagnosis not present

## 2023-11-25 DIAGNOSIS — N390 Urinary tract infection, site not specified: Secondary | ICD-10-CM | POA: Diagnosis not present

## 2023-11-25 DIAGNOSIS — S22069A Unspecified fracture of T7-T8 vertebra, initial encounter for closed fracture: Secondary | ICD-10-CM | POA: Diagnosis not present

## 2023-11-25 DIAGNOSIS — X58XXXA Exposure to other specified factors, initial encounter: Secondary | ICD-10-CM | POA: Diagnosis not present

## 2023-11-25 DIAGNOSIS — A419 Sepsis, unspecified organism: Secondary | ICD-10-CM | POA: Diagnosis not present

## 2023-11-25 DIAGNOSIS — I214 Non-ST elevation (NSTEMI) myocardial infarction: Secondary | ICD-10-CM | POA: Diagnosis not present

## 2023-11-26 DIAGNOSIS — M8580 Other specified disorders of bone density and structure, unspecified site: Secondary | ICD-10-CM | POA: Diagnosis not present

## 2023-11-26 DIAGNOSIS — N390 Urinary tract infection, site not specified: Secondary | ICD-10-CM | POA: Diagnosis not present

## 2023-11-26 DIAGNOSIS — S22069A Unspecified fracture of T7-T8 vertebra, initial encounter for closed fracture: Secondary | ICD-10-CM | POA: Diagnosis not present

## 2023-11-26 DIAGNOSIS — S32029A Unspecified fracture of second lumbar vertebra, initial encounter for closed fracture: Secondary | ICD-10-CM | POA: Diagnosis not present

## 2023-11-26 DIAGNOSIS — R6520 Severe sepsis without septic shock: Secondary | ICD-10-CM | POA: Diagnosis not present

## 2023-11-26 DIAGNOSIS — A419 Sepsis, unspecified organism: Secondary | ICD-10-CM | POA: Diagnosis not present

## 2023-11-26 DIAGNOSIS — Z79899 Other long term (current) drug therapy: Secondary | ICD-10-CM | POA: Diagnosis not present

## 2023-11-26 DIAGNOSIS — T796XXA Traumatic ischemia of muscle, initial encounter: Secondary | ICD-10-CM | POA: Diagnosis not present

## 2023-11-26 DIAGNOSIS — I214 Non-ST elevation (NSTEMI) myocardial infarction: Secondary | ICD-10-CM | POA: Diagnosis not present

## 2023-11-26 DIAGNOSIS — I10 Essential (primary) hypertension: Secondary | ICD-10-CM | POA: Diagnosis not present

## 2023-11-26 DIAGNOSIS — E46 Unspecified protein-calorie malnutrition: Secondary | ICD-10-CM | POA: Diagnosis not present

## 2023-11-27 DIAGNOSIS — Z4682 Encounter for fitting and adjustment of non-vascular catheter: Secondary | ICD-10-CM | POA: Diagnosis not present

## 2023-11-27 DIAGNOSIS — R918 Other nonspecific abnormal finding of lung field: Secondary | ICD-10-CM | POA: Diagnosis not present

## 2023-11-27 DIAGNOSIS — J90 Pleural effusion, not elsewhere classified: Secondary | ICD-10-CM | POA: Diagnosis not present

## 2023-11-28 DIAGNOSIS — G9389 Other specified disorders of brain: Secondary | ICD-10-CM | POA: Diagnosis not present

## 2023-11-28 DIAGNOSIS — M8580 Other specified disorders of bone density and structure, unspecified site: Secondary | ICD-10-CM | POA: Diagnosis not present

## 2023-11-28 DIAGNOSIS — M6282 Rhabdomyolysis: Secondary | ICD-10-CM | POA: Diagnosis not present

## 2023-11-28 DIAGNOSIS — R6521 Severe sepsis with septic shock: Secondary | ICD-10-CM | POA: Diagnosis not present

## 2023-11-28 DIAGNOSIS — E871 Hypo-osmolality and hyponatremia: Secondary | ICD-10-CM | POA: Diagnosis not present

## 2023-11-28 DIAGNOSIS — I6523 Occlusion and stenosis of bilateral carotid arteries: Secondary | ICD-10-CM | POA: Diagnosis not present

## 2023-11-28 DIAGNOSIS — I10 Essential (primary) hypertension: Secondary | ICD-10-CM | POA: Diagnosis not present

## 2023-11-28 DIAGNOSIS — Z79899 Other long term (current) drug therapy: Secondary | ICD-10-CM | POA: Diagnosis not present

## 2023-11-28 DIAGNOSIS — Z792 Long term (current) use of antibiotics: Secondary | ICD-10-CM | POA: Diagnosis not present

## 2023-11-28 DIAGNOSIS — A419 Sepsis, unspecified organism: Secondary | ICD-10-CM | POA: Diagnosis not present

## 2023-11-28 DIAGNOSIS — I214 Non-ST elevation (NSTEMI) myocardial infarction: Secondary | ICD-10-CM | POA: Diagnosis not present

## 2023-11-29 DIAGNOSIS — I6782 Cerebral ischemia: Secondary | ICD-10-CM | POA: Diagnosis not present

## 2023-11-29 DIAGNOSIS — G9389 Other specified disorders of brain: Secondary | ICD-10-CM | POA: Diagnosis not present

## 2023-11-29 DIAGNOSIS — J9811 Atelectasis: Secondary | ICD-10-CM | POA: Diagnosis not present

## 2023-11-29 DIAGNOSIS — M4856XA Collapsed vertebra, not elsewhere classified, lumbar region, initial encounter for fracture: Secondary | ICD-10-CM | POA: Diagnosis not present

## 2023-11-29 DIAGNOSIS — M4804 Spinal stenosis, thoracic region: Secondary | ICD-10-CM | POA: Diagnosis not present

## 2023-11-29 DIAGNOSIS — M4187 Other forms of scoliosis, lumbosacral region: Secondary | ICD-10-CM | POA: Diagnosis not present

## 2023-11-29 DIAGNOSIS — J90 Pleural effusion, not elsewhere classified: Secondary | ICD-10-CM | POA: Diagnosis not present

## 2023-11-29 DIAGNOSIS — R918 Other nonspecific abnormal finding of lung field: Secondary | ICD-10-CM | POA: Diagnosis not present

## 2023-11-29 DIAGNOSIS — M40204 Unspecified kyphosis, thoracic region: Secondary | ICD-10-CM | POA: Diagnosis not present

## 2023-11-29 DIAGNOSIS — M47814 Spondylosis without myelopathy or radiculopathy, thoracic region: Secondary | ICD-10-CM | POA: Diagnosis not present

## 2023-11-29 DIAGNOSIS — M47816 Spondylosis without myelopathy or radiculopathy, lumbar region: Secondary | ICD-10-CM | POA: Diagnosis not present

## 2023-11-30 DIAGNOSIS — Z4682 Encounter for fitting and adjustment of non-vascular catheter: Secondary | ICD-10-CM | POA: Diagnosis not present

## 2023-11-30 DIAGNOSIS — R0989 Other specified symptoms and signs involving the circulatory and respiratory systems: Secondary | ICD-10-CM | POA: Diagnosis not present

## 2023-11-30 DIAGNOSIS — R918 Other nonspecific abnormal finding of lung field: Secondary | ICD-10-CM | POA: Diagnosis not present

## 2023-12-04 DIAGNOSIS — J90 Pleural effusion, not elsewhere classified: Secondary | ICD-10-CM | POA: Diagnosis not present

## 2023-12-08 ENCOUNTER — Ambulatory Visit: Payer: Self-pay | Admitting: Urology

## 2023-12-08 DIAGNOSIS — N3944 Nocturnal enuresis: Secondary | ICD-10-CM
